# Patient Record
Sex: Male | Born: 1950 | Race: Black or African American | Hispanic: No | Marital: Married | Smoking: Never smoker
Health system: Southern US, Community
[De-identification: ages and names within clinical notes are randomized; demographics above are authoritative.]

## PROBLEM LIST (undated history)

## (undated) DIAGNOSIS — I10 Essential (primary) hypertension: Secondary | ICD-10-CM

## (undated) DIAGNOSIS — E119 Type 2 diabetes mellitus without complications: Secondary | ICD-10-CM

---

## 2005-03-08 ENCOUNTER — Emergency Department (HOSPITAL_COMMUNITY): Admission: EM | Admit: 2005-03-08 | Discharge: 2005-03-09 | Payer: Self-pay | Admitting: Emergency Medicine

## 2006-02-27 ENCOUNTER — Emergency Department: Payer: Self-pay | Admitting: Emergency Medicine

## 2007-07-02 ENCOUNTER — Ambulatory Visit: Payer: Self-pay | Admitting: Internal Medicine

## 2015-11-11 DIAGNOSIS — Z125 Encounter for screening for malignant neoplasm of prostate: Secondary | ICD-10-CM | POA: Diagnosis not present

## 2015-11-11 DIAGNOSIS — Z0000 Encounter for general adult medical examination without abnormal findings: Secondary | ICD-10-CM | POA: Diagnosis not present

## 2015-11-11 DIAGNOSIS — E1165 Type 2 diabetes mellitus with hyperglycemia: Secondary | ICD-10-CM | POA: Diagnosis not present

## 2016-01-01 DIAGNOSIS — E119 Type 2 diabetes mellitus without complications: Secondary | ICD-10-CM | POA: Diagnosis not present

## 2016-06-07 DIAGNOSIS — Z87891 Personal history of nicotine dependence: Secondary | ICD-10-CM | POA: Diagnosis not present

## 2016-06-07 DIAGNOSIS — N401 Enlarged prostate with lower urinary tract symptoms: Secondary | ICD-10-CM | POA: Diagnosis not present

## 2016-06-07 DIAGNOSIS — E119 Type 2 diabetes mellitus without complications: Secondary | ICD-10-CM | POA: Diagnosis not present

## 2016-06-07 DIAGNOSIS — I10 Essential (primary) hypertension: Secondary | ICD-10-CM | POA: Diagnosis not present

## 2016-06-07 DIAGNOSIS — Z136 Encounter for screening for cardiovascular disorders: Secondary | ICD-10-CM | POA: Diagnosis not present

## 2016-06-07 DIAGNOSIS — Z125 Encounter for screening for malignant neoplasm of prostate: Secondary | ICD-10-CM | POA: Diagnosis not present

## 2016-06-07 DIAGNOSIS — Z23 Encounter for immunization: Secondary | ICD-10-CM | POA: Diagnosis not present

## 2016-06-07 DIAGNOSIS — E785 Hyperlipidemia, unspecified: Secondary | ICD-10-CM | POA: Diagnosis not present

## 2016-06-07 DIAGNOSIS — R351 Nocturia: Secondary | ICD-10-CM | POA: Diagnosis not present

## 2016-06-07 DIAGNOSIS — Z6825 Body mass index (BMI) 25.0-25.9, adult: Secondary | ICD-10-CM | POA: Diagnosis not present

## 2016-06-07 DIAGNOSIS — Z1159 Encounter for screening for other viral diseases: Secondary | ICD-10-CM | POA: Diagnosis not present

## 2016-06-07 DIAGNOSIS — D649 Anemia, unspecified: Secondary | ICD-10-CM | POA: Diagnosis not present

## 2016-06-20 DIAGNOSIS — Z87891 Personal history of nicotine dependence: Secondary | ICD-10-CM | POA: Diagnosis not present

## 2016-06-20 DIAGNOSIS — Z136 Encounter for screening for cardiovascular disorders: Secondary | ICD-10-CM | POA: Diagnosis not present

## 2016-07-29 DIAGNOSIS — E119 Type 2 diabetes mellitus without complications: Secondary | ICD-10-CM | POA: Diagnosis not present

## 2016-07-29 DIAGNOSIS — I10 Essential (primary) hypertension: Secondary | ICD-10-CM | POA: Diagnosis not present

## 2016-07-29 DIAGNOSIS — E785 Hyperlipidemia, unspecified: Secondary | ICD-10-CM | POA: Diagnosis not present

## 2016-07-29 DIAGNOSIS — Z6826 Body mass index (BMI) 26.0-26.9, adult: Secondary | ICD-10-CM | POA: Diagnosis not present

## 2016-09-12 DIAGNOSIS — E785 Hyperlipidemia, unspecified: Secondary | ICD-10-CM | POA: Diagnosis not present

## 2016-09-12 DIAGNOSIS — R351 Nocturia: Secondary | ICD-10-CM | POA: Diagnosis not present

## 2016-09-12 DIAGNOSIS — I10 Essential (primary) hypertension: Secondary | ICD-10-CM | POA: Diagnosis not present

## 2016-09-12 DIAGNOSIS — N401 Enlarged prostate with lower urinary tract symptoms: Secondary | ICD-10-CM | POA: Diagnosis not present

## 2016-09-12 DIAGNOSIS — E119 Type 2 diabetes mellitus without complications: Secondary | ICD-10-CM | POA: Diagnosis not present

## 2016-09-28 DIAGNOSIS — E119 Type 2 diabetes mellitus without complications: Secondary | ICD-10-CM | POA: Diagnosis not present

## 2016-09-28 DIAGNOSIS — E875 Hyperkalemia: Secondary | ICD-10-CM | POA: Diagnosis not present

## 2016-09-28 DIAGNOSIS — I10 Essential (primary) hypertension: Secondary | ICD-10-CM | POA: Diagnosis not present

## 2016-10-24 DIAGNOSIS — N401 Enlarged prostate with lower urinary tract symptoms: Secondary | ICD-10-CM | POA: Diagnosis not present

## 2016-10-24 DIAGNOSIS — R351 Nocturia: Secondary | ICD-10-CM | POA: Diagnosis not present

## 2016-12-12 DIAGNOSIS — E119 Type 2 diabetes mellitus without complications: Secondary | ICD-10-CM | POA: Diagnosis not present

## 2016-12-12 DIAGNOSIS — R351 Nocturia: Secondary | ICD-10-CM | POA: Diagnosis not present

## 2016-12-12 DIAGNOSIS — N401 Enlarged prostate with lower urinary tract symptoms: Secondary | ICD-10-CM | POA: Diagnosis not present

## 2016-12-12 DIAGNOSIS — N179 Acute kidney failure, unspecified: Secondary | ICD-10-CM | POA: Diagnosis not present

## 2016-12-12 DIAGNOSIS — M79642 Pain in left hand: Secondary | ICD-10-CM | POA: Diagnosis not present

## 2016-12-12 DIAGNOSIS — E1142 Type 2 diabetes mellitus with diabetic polyneuropathy: Secondary | ICD-10-CM | POA: Diagnosis not present

## 2016-12-12 DIAGNOSIS — M79641 Pain in right hand: Secondary | ICD-10-CM | POA: Diagnosis not present

## 2016-12-12 DIAGNOSIS — Z23 Encounter for immunization: Secondary | ICD-10-CM | POA: Diagnosis not present

## 2016-12-12 DIAGNOSIS — G609 Hereditary and idiopathic neuropathy, unspecified: Secondary | ICD-10-CM | POA: Diagnosis not present

## 2016-12-12 DIAGNOSIS — M79672 Pain in left foot: Secondary | ICD-10-CM | POA: Diagnosis not present

## 2016-12-12 DIAGNOSIS — E1165 Type 2 diabetes mellitus with hyperglycemia: Secondary | ICD-10-CM | POA: Diagnosis not present

## 2016-12-12 DIAGNOSIS — I10 Essential (primary) hypertension: Secondary | ICD-10-CM | POA: Diagnosis not present

## 2016-12-12 DIAGNOSIS — Z6827 Body mass index (BMI) 27.0-27.9, adult: Secondary | ICD-10-CM | POA: Diagnosis not present

## 2016-12-12 DIAGNOSIS — M79671 Pain in right foot: Secondary | ICD-10-CM | POA: Diagnosis not present

## 2016-12-12 DIAGNOSIS — E785 Hyperlipidemia, unspecified: Secondary | ICD-10-CM | POA: Diagnosis not present

## 2016-12-12 DIAGNOSIS — E875 Hyperkalemia: Secondary | ICD-10-CM | POA: Diagnosis not present

## 2016-12-23 DIAGNOSIS — E119 Type 2 diabetes mellitus without complications: Secondary | ICD-10-CM | POA: Diagnosis not present

## 2016-12-23 DIAGNOSIS — H179 Unspecified corneal scar and opacity: Secondary | ICD-10-CM | POA: Diagnosis not present

## 2016-12-23 DIAGNOSIS — N179 Acute kidney failure, unspecified: Secondary | ICD-10-CM | POA: Diagnosis not present

## 2016-12-23 DIAGNOSIS — H2513 Age-related nuclear cataract, bilateral: Secondary | ICD-10-CM | POA: Diagnosis not present

## 2016-12-23 DIAGNOSIS — E11319 Type 2 diabetes mellitus with unspecified diabetic retinopathy without macular edema: Secondary | ICD-10-CM | POA: Diagnosis not present

## 2016-12-23 DIAGNOSIS — H04123 Dry eye syndrome of bilateral lacrimal glands: Secondary | ICD-10-CM | POA: Diagnosis not present

## 2017-01-03 DIAGNOSIS — E119 Type 2 diabetes mellitus without complications: Secondary | ICD-10-CM | POA: Diagnosis not present

## 2017-01-03 DIAGNOSIS — Z6827 Body mass index (BMI) 27.0-27.9, adult: Secondary | ICD-10-CM | POA: Diagnosis not present

## 2017-01-03 DIAGNOSIS — N179 Acute kidney failure, unspecified: Secondary | ICD-10-CM | POA: Diagnosis not present

## 2018-09-06 DIAGNOSIS — Z7984 Long term (current) use of oral hypoglycemic drugs: Secondary | ICD-10-CM | POA: Diagnosis not present

## 2018-09-06 DIAGNOSIS — E1165 Type 2 diabetes mellitus with hyperglycemia: Secondary | ICD-10-CM | POA: Diagnosis not present

## 2018-09-06 DIAGNOSIS — N179 Acute kidney failure, unspecified: Secondary | ICD-10-CM | POA: Diagnosis not present

## 2018-09-06 DIAGNOSIS — R1030 Lower abdominal pain, unspecified: Secondary | ICD-10-CM | POA: Diagnosis not present

## 2018-09-06 DIAGNOSIS — R600 Localized edema: Secondary | ICD-10-CM | POA: Diagnosis not present

## 2018-09-06 DIAGNOSIS — E860 Dehydration: Secondary | ICD-10-CM | POA: Diagnosis not present

## 2018-09-06 DIAGNOSIS — M5137 Other intervertebral disc degeneration, lumbosacral region: Secondary | ICD-10-CM | POA: Diagnosis not present

## 2018-09-06 DIAGNOSIS — I10 Essential (primary) hypertension: Secondary | ICD-10-CM | POA: Diagnosis not present

## 2018-09-06 DIAGNOSIS — M545 Low back pain: Secondary | ICD-10-CM | POA: Diagnosis not present

## 2018-09-06 DIAGNOSIS — H538 Other visual disturbances: Secondary | ICD-10-CM | POA: Diagnosis not present

## 2018-09-06 DIAGNOSIS — Z79899 Other long term (current) drug therapy: Secondary | ICD-10-CM | POA: Diagnosis not present

## 2018-09-06 DIAGNOSIS — E785 Hyperlipidemia, unspecified: Secondary | ICD-10-CM | POA: Diagnosis not present

## 2018-09-06 DIAGNOSIS — G8929 Other chronic pain: Secondary | ICD-10-CM | POA: Diagnosis not present

## 2018-09-06 DIAGNOSIS — Z7982 Long term (current) use of aspirin: Secondary | ICD-10-CM | POA: Diagnosis not present

## 2018-09-06 DIAGNOSIS — M2578 Osteophyte, vertebrae: Secondary | ICD-10-CM | POA: Diagnosis not present

## 2018-09-06 DIAGNOSIS — Z87891 Personal history of nicotine dependence: Secondary | ICD-10-CM | POA: Diagnosis not present

## 2018-09-25 DIAGNOSIS — N179 Acute kidney failure, unspecified: Secondary | ICD-10-CM | POA: Diagnosis not present

## 2018-09-25 DIAGNOSIS — Z6826 Body mass index (BMI) 26.0-26.9, adult: Secondary | ICD-10-CM | POA: Diagnosis not present

## 2018-09-25 DIAGNOSIS — E119 Type 2 diabetes mellitus without complications: Secondary | ICD-10-CM | POA: Diagnosis not present

## 2018-09-25 DIAGNOSIS — G629 Polyneuropathy, unspecified: Secondary | ICD-10-CM | POA: Diagnosis not present

## 2018-09-25 DIAGNOSIS — R799 Abnormal finding of blood chemistry, unspecified: Secondary | ICD-10-CM | POA: Diagnosis not present

## 2018-09-25 DIAGNOSIS — Z23 Encounter for immunization: Secondary | ICD-10-CM | POA: Diagnosis not present

## 2018-12-10 DIAGNOSIS — I10 Essential (primary) hypertension: Secondary | ICD-10-CM | POA: Diagnosis not present

## 2018-12-10 DIAGNOSIS — Z23 Encounter for immunization: Secondary | ICD-10-CM | POA: Diagnosis not present

## 2018-12-10 DIAGNOSIS — R351 Nocturia: Secondary | ICD-10-CM | POA: Diagnosis not present

## 2018-12-10 DIAGNOSIS — R809 Proteinuria, unspecified: Secondary | ICD-10-CM | POA: Diagnosis not present

## 2018-12-10 DIAGNOSIS — E1129 Type 2 diabetes mellitus with other diabetic kidney complication: Secondary | ICD-10-CM | POA: Diagnosis not present

## 2018-12-10 DIAGNOSIS — N401 Enlarged prostate with lower urinary tract symptoms: Secondary | ICD-10-CM | POA: Diagnosis not present

## 2018-12-10 DIAGNOSIS — Z9189 Other specified personal risk factors, not elsewhere classified: Secondary | ICD-10-CM | POA: Diagnosis not present

## 2018-12-10 DIAGNOSIS — Z1159 Encounter for screening for other viral diseases: Secondary | ICD-10-CM | POA: Diagnosis not present

## 2018-12-10 DIAGNOSIS — Z6826 Body mass index (BMI) 26.0-26.9, adult: Secondary | ICD-10-CM | POA: Diagnosis not present

## 2018-12-16 ENCOUNTER — Other Ambulatory Visit: Payer: Self-pay

## 2018-12-16 ENCOUNTER — Emergency Department
Admission: EM | Admit: 2018-12-16 | Discharge: 2018-12-17 | Disposition: A | Discharge status: Home / Self Care | Payer: PPO | Attending: Internal Medicine | Admitting: Internal Medicine

## 2018-12-16 ENCOUNTER — Emergency Department: Payer: PPO

## 2018-12-16 ENCOUNTER — Encounter: Payer: Self-pay | Admitting: Emergency Medicine

## 2018-12-16 DIAGNOSIS — N183 Chronic kidney disease, stage 3 unspecified: Secondary | ICD-10-CM

## 2018-12-16 DIAGNOSIS — E119 Type 2 diabetes mellitus without complications: Secondary | ICD-10-CM | POA: Diagnosis not present

## 2018-12-16 DIAGNOSIS — R509 Fever, unspecified: Secondary | ICD-10-CM | POA: Diagnosis not present

## 2018-12-16 DIAGNOSIS — E871 Hypo-osmolality and hyponatremia: Secondary | ICD-10-CM

## 2018-12-16 DIAGNOSIS — I10 Essential (primary) hypertension: Secondary | ICD-10-CM | POA: Diagnosis not present

## 2018-12-16 DIAGNOSIS — M791 Myalgia, unspecified site: Secondary | ICD-10-CM | POA: Diagnosis present

## 2018-12-16 DIAGNOSIS — N179 Acute kidney failure, unspecified: Secondary | ICD-10-CM | POA: Diagnosis not present

## 2018-12-16 DIAGNOSIS — E1165 Type 2 diabetes mellitus with hyperglycemia: Secondary | ICD-10-CM | POA: Diagnosis not present

## 2018-12-16 DIAGNOSIS — Z79899 Other long term (current) drug therapy: Secondary | ICD-10-CM | POA: Diagnosis not present

## 2018-12-16 DIAGNOSIS — U071 COVID-19: Secondary | ICD-10-CM | POA: Diagnosis not present

## 2018-12-16 DIAGNOSIS — R0689 Other abnormalities of breathing: Secondary | ICD-10-CM | POA: Diagnosis not present

## 2018-12-16 DIAGNOSIS — R0902 Hypoxemia: Secondary | ICD-10-CM | POA: Diagnosis not present

## 2018-12-16 DIAGNOSIS — Z7982 Long term (current) use of aspirin: Secondary | ICD-10-CM | POA: Diagnosis not present

## 2018-12-16 DIAGNOSIS — R000 Tachycardia, unspecified: Secondary | ICD-10-CM | POA: Diagnosis not present

## 2018-12-16 HISTORY — DX: Essential (primary) hypertension: I10

## 2018-12-16 HISTORY — DX: Type 2 diabetes mellitus without complications: E11.9

## 2018-12-16 LAB — COMPREHENSIVE METABOLIC PANEL
ALT: 25 U/L (ref 0–44)
AST: 29 U/L (ref 15–41)
Albumin: 3.5 g/dL (ref 3.5–5.0)
Alkaline Phosphatase: 34 U/L — ABNORMAL LOW (ref 38–126)
Anion gap: 13 (ref 5–15)
BUN: 54 mg/dL — ABNORMAL HIGH (ref 8–23)
CO2: 16 mmol/L — ABNORMAL LOW (ref 22–32)
Calcium: 7.7 mg/dL — ABNORMAL LOW (ref 8.9–10.3)
Chloride: 101 mmol/L (ref 98–111)
Creatinine, Ser: 2.59 mg/dL — ABNORMAL HIGH (ref 0.61–1.24)
GFR calc Af Amer: 28 mL/min — ABNORMAL LOW (ref >60)
GFR calc non Af Amer: 24 mL/min — ABNORMAL LOW (ref >60)
Glucose, Bld: 240 mg/dL — ABNORMAL HIGH (ref 70–99)
Potassium: 4.1 mmol/L (ref 3.5–5.1)
Sodium: 130 mmol/L — ABNORMAL LOW (ref 135–145)
Total Bilirubin: 0.8 mg/dL (ref 0.3–1.2)
Total Protein: 6.9 g/dL (ref 6.5–8.1)

## 2018-12-16 LAB — CBC WITH DIFFERENTIAL/PLATELET
Abs Immature Granulocytes: 0.02 10*3/uL (ref 0.00–0.07)
Basophils Absolute: 0 10*3/uL (ref 0.0–0.1)
Basophils Relative: 0 %
Eosinophils Absolute: 0 10*3/uL (ref 0.0–0.5)
Eosinophils Relative: 0 %
HCT: 33.8 % — ABNORMAL LOW (ref 39.0–52.0)
Hemoglobin: 11.7 g/dL — ABNORMAL LOW (ref 13.0–17.0)
Immature Granulocytes: 0 %
Lymphocytes Relative: 17 %
Lymphs Abs: 0.8 10*3/uL (ref 0.7–4.0)
MCH: 28.3 pg (ref 26.0–34.0)
MCHC: 34.6 g/dL (ref 30.0–36.0)
MCV: 81.8 fL (ref 80.0–100.0)
Monocytes Absolute: 0.3 10*3/uL (ref 0.1–1.0)
Monocytes Relative: 7 %
Neutro Abs: 3.8 10*3/uL (ref 1.7–7.7)
Neutrophils Relative %: 76 %
Platelets: 144 10*3/uL — ABNORMAL LOW (ref 150–400)
RBC: 4.13 MIL/uL — ABNORMAL LOW (ref 4.22–5.81)
RDW: 12.2 % (ref 11.5–15.5)
WBC: 4.9 10*3/uL (ref 4.0–10.5)
nRBC: 0 % (ref 0.0–0.2)

## 2018-12-16 MED ORDER — ACETAMINOPHEN 325 MG PO TABS
650.0000 mg | ORAL_TABLET | Freq: Once | ORAL | Status: AC
  Administered 2018-12-16: 650 mg via ORAL
  Filled 2018-12-16: qty 2

## 2018-12-16 MED ORDER — LIDOCAINE VISCOUS HCL 2 % MT SOLN
15.0000 mL | Freq: Once | OROMUCOSAL | Status: AC
  Administered 2018-12-16: 15 mL via ORAL
  Filled 2018-12-16: qty 15

## 2018-12-16 MED ORDER — ALUM & MAG HYDROXIDE-SIMETH 200-200-20 MG/5ML PO SUSP
30.0000 mL | Freq: Once | ORAL | Status: AC
  Administered 2018-12-16: 23:00:00 30 mL via ORAL
  Filled 2018-12-16: qty 30

## 2018-12-16 MED ORDER — SODIUM CHLORIDE 0.9 % IV BOLUS
1000.0000 mL | Freq: Once | INTRAVENOUS | Status: AC
  Administered 2018-12-16: 17:00:00 1000 mL via INTRAVENOUS

## 2018-12-16 NOTE — ED Triage Notes (Signed)
Patient presents to the ED via EMS from home for body aches and difficulty sleeping.  Patient tested positive for Covid19 on October 6th after being around family members with Covid 39.  Patient denies shortness of breath or chest pain.  Patient is febrile at this time.  Alert and oriented x 4.

## 2018-12-16 NOTE — ED Notes (Addendum)
Pt woke to rounding, c/o indigestion, 1/10 center chest burning, no recent hx of same, no radiation or associated s/sx, maalox offered

## 2018-12-16 NOTE — Progress Notes (Signed)
68 yo male with HTN and T2DM, diagnosed wit COVID 19 on October 6, at home feeling very weak, fever and malaise. On admission stable vital signs, not hypoxic and chest film with no infiltrates. Noted elevated serum cr at 2.49 with K at 4,1.  Admitted to medical ward for AKI.

## 2018-12-16 NOTE — ED Notes (Signed)
Called Phil at [facility] for transfer to [facility]

## 2018-12-16 NOTE — ED Notes (Signed)
ED TO INPATIENT HANDOFF REPORT  ED Nurse Name and Phone #: Danelle Earthly [phone]  S Name/Age/Gender Bryan Oconnor 68 y.o. male Room/Bed: ED16A/ED16A  Code Status   Code Status: Not on file  Home/SNF/Other Home Patient oriented to: self, place, time and situation Is this baseline? Yes   Triage Complete: Triage complete  Chief Complaint body aches  Triage Note Patient presents to the ED via EMS from home for body aches and difficulty sleeping.  Patient tested positive for Covid19 on October 6th after being around family members with Covid 19.  Patient denies shortness of breath or chest pain.  Patient is febrile at this time.  Alert and oriented x 4.     Allergies Allergies  Allergen Reactions  . Pollen Extract Cough    Level of Care/Admitting Diagnosis ED Disposition    ED Disposition Condition Comment   Admit  Hospital Area: [facility] [[id number]]  Level of Care: Med-Surg [16]  Covid Evaluation: Confirmed COVID Positive  Diagnosis: AKI (acute kidney injury) [facility]) [[id number]]  Admitting Physician: Coralie Keens [[id number]]  Attending Physician: Coralie Keens [[id number]]  Estimated length of stay: 3 - 4 days  Certification:: I certify this patient will need inpatient services for at least 2 midnights  PT Class (Do Not Modify): Inpatient [101]  PT Acc Code (Do Not Modify): Private [1]       B Medical/Surgery History Past Medical History:  Diagnosis Date  . Diabetes mellitus without complication (HCC)   . Hypertension    History reviewed. No pertinent surgical history.   A IV Location/Drains/Wounds Patient Lines/Drains/Airways Status   Active Line/Drains/Airways    Name:   Placement date:   Placement time:   Site:   Days:   Peripheral IV 12/16/18 Anterior;Distal;Left;Upper Arm   12/16/18    -    Arm   less than 1          Intake/Output Last 24 hours No intake or output data in the 24 hours ending 12/16/18  2023  Labs/Imaging Results for orders placed or performed during the hospital encounter of 12/16/18 (from the past 48 hour(s))  CBC with Differential     Status: Abnormal   Collection Time: 12/16/18  4:02 PM  Result Value Ref Range   WBC 4.9 4.0 - 10.5 K/uL   RBC 4.13 (L) 4.22 - 5.81 MIL/uL   Hemoglobin 11.7 (L) 13.0 - 17.0 g/dL   HCT 93.2 (L) 67.1 - 24.5 %   MCV 81.8 80.0 - 100.0 fL   MCH 28.3 26.0 - 34.0 pg   MCHC 34.6 30.0 - 36.0 g/dL   RDW 80.9 98.3 - 38.2 %   Platelets 144 (L) 150 - 400 K/uL   nRBC 0.0 0.0 - 0.2 %   Neutrophils Relative % 76 %   Neutro Abs 3.8 1.7 - 7.7 K/uL   Lymphocytes Relative 17 %   Lymphs Abs 0.8 0.7 - 4.0 K/uL   Monocytes Relative 7 %   Monocytes Absolute 0.3 0.1 - 1.0 K/uL   Eosinophils Relative 0 %   Eosinophils Absolute 0.0 0.0 - 0.5 K/uL   Basophils Relative 0 %   Basophils Absolute 0.0 0.0 - 0.1 K/uL   Immature Granulocytes 0 %   Abs Immature Granulocytes 0.02 0.00 - 0.07 K/uL    Comment: Performed at [facility], [address] [place], [place] [address]  Comprehensive metabolic panel     Status: Abnormal   Collection Time: 12/16/18  4:02  PM  Result Value Ref Range   Sodium 130 (L) 135 - 145 mmol/L   Potassium 4.1 3.5 - 5.1 mmol/L   Chloride 101 98 - 111 mmol/L   CO2 16 (L) 22 - 32 mmol/L   Glucose, Bld 240 (H) 70 - 99 mg/dL   BUN 54 (H) 8 - 23 mg/dL   Creatinine, Ser 2.59 (H) 0.61 - 1.24 mg/dL   Calcium 7.7 (L) 8.9 - 10.3 mg/dL   Total Protein 6.9 6.5 - 8.1 g/dL   Albumin 3.5 3.5 - 5.0 g/dL   AST 29 15 - 41 U/L   ALT 25 0 - 44 U/L   Alkaline Phosphatase 34 (L) 38 - 126 U/L   Total Bilirubin 0.8 0.3 - 1.2 mg/dL   GFR calc non Af Amer 24 (L) >60 mL/min   GFR calc Af Amer 28 (L) >60 mL/min   Anion gap 13 5 - 15    Comment: Performed at [facility], [address] [place], [place] [address]   Dg Chest Portable 1 View  Result Date: 12/16/2018 CLINICAL DATA:  Fever.  COVID-19 positive. EXAM: PORTABLE CHEST 1  VIEW COMPARISON:  None. FINDINGS: Cardiomediastinal silhouette is normal. Mediastinal contours appear intact. There is no evidence of focal airspace consolidation, pleural effusion or pneumothorax. Osseous structures are without acute abnormality. Soft tissues are grossly normal. IMPRESSION: No active disease. Electronically Signed   By: Fidela Salisbury M.D.   On: 12/16/2018 16:01    Pending Labs Unresulted Labs (From admission, onward)   None      Vitals/Pain Today's Vitals   12/16/18 1800 12/16/18 1825 12/16/18 1830 12/16/18 1900  BP: 115/77  120/81 133/74  Pulse: 88  73 77  Resp:   17 19  Temp:      TempSrc:      SpO2: 100%  98% 97%  Weight:      Height:      PainSc:  0-No pain      Isolation Precautions No active isolations  Medications Medications  acetaminophen (TYLENOL) tablet 650 mg (650 mg Oral Given 12/16/18 1605)  sodium chloride 0.9 % bolus 1,000 mL (1,000 mLs Intravenous New Bag/Given 12/16/18 1656)    Mobility walks Low fall risk   Focused Assessments Pulmonary Assessment Handoff:  Lung sounds:   O2 Device: Room Air        R Recommendations: See Admitting Provider Note  Report given to:   Additional Notes:

## 2018-12-16 NOTE — ED Provider Notes (Signed)
ED ECG REPORT I, SUNG,JADE J, the attending physician, personally viewed and interpreted this ECG.   Date: 12/16/2018  EKG Time: 2313  Rate: 74  Rhythm: normal EKG, normal sinus rhythm  Axis: Normal  Intervals:none  ST&T Change: Nonspecific  Patient complaining of indigestion.  EKG unremarkable.  Will administer GI cocktail.    Paulette Blanch, MD 12/17/18 [phone]

## 2018-12-16 NOTE — ED Provider Notes (Signed)
[facility] Emergency Department Provider Note  ____________________________________________  Time seen: Approximately 3:42 PM  I have reviewed the triage vital signs and the nursing notes.   HISTORY  Chief Complaint Generalized Body Aches   HPI Bryan Oconnor is a 68 y.o. male who tested positive for COVID-19 from 12/11/2018 and presents to the emergency department today for difficulty sleeping, body aches and fever.  Patient denies chest pain or shortness of breath.  He states that he has an occasional cough but seems like it has improved over the past few days.  He denies any vomiting or diarrhea.   Patient states that he was not given any medications when he was diagnosed.  He states that he took Tylenol last night but otherwise has not taken any medications other than his home medications.   Past Medical History:  Diagnosis Date  . Diabetes mellitus without complication ([place])   . Hypertension     Patient Active Problem List   Diagnosis Date Noted  . AKI (acute kidney injury) ([place]) 12/16/2018    History reviewed. No pertinent surgical history.  Prior to Admission medications   Medication Sig Start Date End Date Taking? Authorizing Provider  aspirin EC 81 MG tablet Take 81 mg by mouth daily.   Yes [provider]  atorvastatin (LIPITOR) 40 MG tablet Take 40 mg by mouth daily. 11/26/18  Yes [provider]  finasteride (PROSCAR) 5 MG tablet Take 5 mg by mouth daily. 11/26/18  Yes [provider]  gabapentin (NEURONTIN) 300 MG capsule Take 600 mg by mouth at bedtime. 12/10/18  Yes [provider]  glipiZIDE (GLUCOTROL XL) 10 MG 24 hr tablet Take 10 mg by mouth daily. 11/26/18  Yes [provider]  lisinopril (ZESTRIL) 10 MG tablet Take 10 mg by mouth daily. 12/10/18  Yes [provider]  metFORMIN (GLUCOPHAGE) 1000 MG tablet Take 1,000 mg by mouth 2 (two) times daily with a meal. 11/26/18  Yes [provider]    Allergies Pollen extract  No family history on file.  Social History Social History   Tobacco Use  . Smoking status: Never Smoker  . Smokeless tobacco: Never Used  Substance Use Topics  . Alcohol use: Never    Frequency: Never  . Drug use: Not on file    Review of Systems Constitutional: Positive for fever/chills.  Decreased appetite. ENT: Negative for sore throat. Cardiovascular: Denies chest pain. Respiratory: No shortness of breath.  Positive for cough.  Negative wheezing.  Gastrointestinal: Negative for nausea, no vomiting.  No diarrhea.  Musculoskeletal: Positive for body aches Skin: Negative for rash. Neurological: Positive for headaches ____________________________________________   PHYSICAL EXAM:  VITAL SIGNS: ED Triage Vitals  Enc Vitals Group     BP 12/16/18 1432 123/71     Pulse Rate 12/16/18 1432 (!) 106     Resp 12/16/18 1432 (!) 22     Temp 12/16/18 1432 (!) 101.8 F (38.8 C)     Temp Source 12/16/18 1432 Oral     SpO2 12/16/18 1432 99 %     Weight 12/16/18 1433 160 lb (72.6 kg)     Height 12/16/18 1433 5\' 6"  (1.676 m)     Head Circumference --      Peak Flow --      Pain Score 12/16/18 1433 4     Pain Loc --      Pain Edu? --      Excl. in [place]? --  Constitutional: Alert and oriented.  Overall well appearing and in no acute distress. Eyes: Conjunctivae are normal. Ears: Bilateral TMs are normal Nose: No sinus congestion noted; no rhinnorhea. Mouth/Throat: Mucous membranes are moist.  Oropharynx clear. Tonsils not visualized. Uvula midline. Neck: No stridor.  Lymphatic: No cervical lymphadenopathy. Cardiovascular: Normal rate, regular rhythm. Good peripheral circulation. Respiratory: Respirations are even and unlabored.  No retractions.  Breath sounds clear to auscultation throughout. Gastrointestinal: Soft and nontender.  Musculoskeletal: FROM x 4 extremities.  Neurologic:  Normal speech and language. Skin:  Skin is  warm, dry and intact. No rash noted. Psychiatric: Mood and affect are normal. Speech and behavior are normal.  ____________________________________________   LABS (all labs ordered are listed, but only abnormal results are displayed)  Labs Reviewed  CBC WITH DIFFERENTIAL/PLATELET - Abnormal; Notable for the following components:      Result Value   RBC 4.13 (*)    Hemoglobin 11.7 (*)    HCT 33.8 (*)    Platelets 144 (*)    All other components within normal limits  COMPREHENSIVE METABOLIC PANEL - Abnormal; Notable for the following components:   Sodium 130 (*)    CO2 16 (*)    Glucose, Bld 240 (*)    BUN 54 (*)    Creatinine, Ser 2.59 (*)    Calcium 7.7 (*)    Alkaline Phosphatase 34 (*)    GFR calc non Af Amer 24 (*)    GFR calc Af Amer 28 (*)    All other components within normal limits   ____________________________________________  EKG  Not indicated. ____________________________________________  RADIOLOGY  Chest x-ray is negative for acute cardiopulmonary abnormality per radiology. ____________________________________________   PROCEDURES  Procedure(s) performed: None  Critical Care performed: No ____________________________________________   INITIAL IMPRESSION / ASSESSMENT AND PLAN / ED COURSE  68 y.o. male presenting to the emergency department for treatment and evaluation of body aches, fever, and difficulty sleeping.  See HPI for further details.  Plan will be to obtain a chest x-ray and basic labs.  He is not hypoxic but he is slightly tachycardic with a respiratory rate of 22 and a temperature 101.8.  He will be given Tylenol.  His tachycardia and tachypnea are most likely related to fever. Will reevaluate after fever is reduced and some studies have resulted.  Differential diagnosis includes, but is not limited to: COVID-19, SIRS, Pneumonia.  CBC is overall unremarkable.  CM P does show hyponatremia at 130 as well as BUN of 54 and a creatinine of 2.5  with an overall GFR 28 and glucose is 240.  No labs for comparison are available.  The patient will require admission for acute kidney injury, however because he is COVID positive and there are no available COVID beds in [facility] at this time he will require transfer to [facility].  Plan was discussed with the patient and he agrees to the transfer.  Arrangements will be made by Dr. Cyril Loosen who will also complete the EMTALA form.  Medications  acetaminophen (TYLENOL) tablet 650 mg (650 mg Oral Given 12/16/18 1605)  sodium chloride 0.9 % bolus 1,000 mL (1,000 mLs Intravenous New Bag/Given 12/16/18 1656)    ED Discharge Orders    None       Pertinent labs & imaging results that were available during my care of the patient were reviewed by me and considered in my medical decision making (see chart for details).    If controlled substance prescribed during this visit, 12  month history viewed on the [facility] prior to issuing an initial prescription for Schedule II or III opiod. ____________________________________________   FINAL CLINICAL IMPRESSION(S) / ED DIAGNOSES  Final diagnoses:  COVID-19  Acute kidney injury (HCC)    Note:  This document was prepared using Dragon voice recognition software and may include unintentional dictation errors.    Chinita Pesterriplett, Romonia Yanik B, FNP 12/16/18 1953    Jene EveryKinner, Robert, MD 12/16/18 2015

## 2018-12-17 ENCOUNTER — Observation Stay (HOSPITAL_COMMUNITY): Payer: PPO

## 2018-12-17 ENCOUNTER — Inpatient Hospital Stay (HOSPITAL_COMMUNITY)
Admission: AD | Admit: 2018-12-17 | Discharge: 2018-12-22 | DRG: 177 | Disposition: A | Discharge status: Home / Self Care | Payer: PPO | Source: Other Acute Inpatient Hospital | Attending: Internal Medicine | Admitting: Internal Medicine

## 2018-12-17 ENCOUNTER — Encounter (HOSPITAL_COMMUNITY): Payer: Self-pay | Admitting: Family Medicine

## 2018-12-17 DIAGNOSIS — E1165 Type 2 diabetes mellitus with hyperglycemia: Secondary | ICD-10-CM | POA: Diagnosis not present

## 2018-12-17 DIAGNOSIS — R7401 Elevation of levels of liver transaminase levels: Secondary | ICD-10-CM | POA: Diagnosis not present

## 2018-12-17 DIAGNOSIS — R509 Fever, unspecified: Secondary | ICD-10-CM

## 2018-12-17 DIAGNOSIS — R05 Cough: Secondary | ICD-10-CM | POA: Diagnosis present

## 2018-12-17 DIAGNOSIS — J1282 Pneumonia due to coronavirus disease 2019: Secondary | ICD-10-CM | POA: Diagnosis present

## 2018-12-17 DIAGNOSIS — J129 Viral pneumonia, unspecified: Secondary | ICD-10-CM

## 2018-12-17 DIAGNOSIS — J1289 Other viral pneumonia: Secondary | ICD-10-CM | POA: Diagnosis not present

## 2018-12-17 DIAGNOSIS — J9601 Acute respiratory failure with hypoxia: Secondary | ICD-10-CM | POA: Diagnosis present

## 2018-12-17 DIAGNOSIS — T380X5A Adverse effect of glucocorticoids and synthetic analogues, initial encounter: Secondary | ICD-10-CM | POA: Diagnosis not present

## 2018-12-17 DIAGNOSIS — N1831 Chronic kidney disease, stage 3a: Secondary | ICD-10-CM | POA: Diagnosis not present

## 2018-12-17 DIAGNOSIS — E861 Hypovolemia: Secondary | ICD-10-CM | POA: Diagnosis present

## 2018-12-17 DIAGNOSIS — Z7984 Long term (current) use of oral hypoglycemic drugs: Secondary | ICD-10-CM | POA: Diagnosis not present

## 2018-12-17 DIAGNOSIS — I10 Essential (primary) hypertension: Secondary | ICD-10-CM | POA: Diagnosis not present

## 2018-12-17 DIAGNOSIS — N179 Acute kidney failure, unspecified: Secondary | ICD-10-CM | POA: Diagnosis not present

## 2018-12-17 DIAGNOSIS — E1122 Type 2 diabetes mellitus with diabetic chronic kidney disease: Secondary | ICD-10-CM | POA: Diagnosis present

## 2018-12-17 DIAGNOSIS — Z79899 Other long term (current) drug therapy: Secondary | ICD-10-CM | POA: Diagnosis not present

## 2018-12-17 DIAGNOSIS — N183 Chronic kidney disease, stage 3 unspecified: Secondary | ICD-10-CM | POA: Diagnosis not present

## 2018-12-17 DIAGNOSIS — U071 COVID-19: Secondary | ICD-10-CM | POA: Diagnosis not present

## 2018-12-17 DIAGNOSIS — E785 Hyperlipidemia, unspecified: Secondary | ICD-10-CM | POA: Diagnosis present

## 2018-12-17 DIAGNOSIS — Z7982 Long term (current) use of aspirin: Secondary | ICD-10-CM

## 2018-12-17 DIAGNOSIS — D631 Anemia in chronic kidney disease: Secondary | ICD-10-CM | POA: Diagnosis not present

## 2018-12-17 DIAGNOSIS — I129 Hypertensive chronic kidney disease with stage 1 through stage 4 chronic kidney disease, or unspecified chronic kidney disease: Secondary | ICD-10-CM | POA: Diagnosis present

## 2018-12-17 DIAGNOSIS — E871 Hypo-osmolality and hyponatremia: Secondary | ICD-10-CM | POA: Diagnosis present

## 2018-12-17 DIAGNOSIS — E872 Acidosis: Secondary | ICD-10-CM | POA: Diagnosis not present

## 2018-12-17 LAB — CBC WITH DIFFERENTIAL/PLATELET
Abs Immature Granulocytes: 0.01 10*3/uL (ref 0.00–0.07)
Basophils Absolute: 0 10*3/uL (ref 0.0–0.1)
Basophils Relative: 0 %
Eosinophils Absolute: 0 10*3/uL (ref 0.0–0.5)
Eosinophils Relative: 0 %
HCT: 35.2 % — ABNORMAL LOW (ref 39.0–52.0)
Hemoglobin: 11.8 g/dL — ABNORMAL LOW (ref 13.0–17.0)
Immature Granulocytes: 0 %
Lymphocytes Relative: 14 %
Lymphs Abs: 0.6 10*3/uL — ABNORMAL LOW (ref 0.7–4.0)
MCH: 28.2 pg (ref 26.0–34.0)
MCHC: 33.5 g/dL (ref 30.0–36.0)
MCV: 84.2 fL (ref 80.0–100.0)
Monocytes Absolute: 0.2 10*3/uL (ref 0.1–1.0)
Monocytes Relative: 5 %
Neutro Abs: 3.3 10*3/uL (ref 1.7–7.7)
Neutrophils Relative %: 81 %
Platelets: 167 10*3/uL (ref 150–400)
RBC: 4.18 MIL/uL — ABNORMAL LOW (ref 4.22–5.81)
RDW: 12.4 % (ref 11.5–15.5)
WBC: 4.1 10*3/uL (ref 4.0–10.5)
nRBC: 0 % (ref 0.0–0.2)

## 2018-12-17 LAB — GLUCOSE, CAPILLARY
Glucose-Capillary: 203 mg/dL — ABNORMAL HIGH (ref 70–99)
Glucose-Capillary: 169 mg/dL — ABNORMAL HIGH (ref 70–99)
Glucose-Capillary: 161 mg/dL — ABNORMAL HIGH (ref 70–99)
Glucose-Capillary: 279 mg/dL — ABNORMAL HIGH (ref 70–99)
Glucose-Capillary: 414 mg/dL — ABNORMAL HIGH (ref 70–99)

## 2018-12-17 LAB — URINALYSIS, COMPLETE (UACMP) WITH MICROSCOPIC
Bilirubin Urine: NEGATIVE
Glucose, UA: NEGATIVE mg/dL
Ketones, ur: NEGATIVE mg/dL
Leukocytes,Ua: NEGATIVE
Nitrite: NEGATIVE
Protein, ur: 30 mg/dL — AB
Specific Gravity, Urine: 1.013 (ref 1.005–1.030)
pH: 5 (ref 5.0–8.0)

## 2018-12-17 LAB — D-DIMER, QUANTITATIVE: D-Dimer, Quant: 1.48 ug/mL-FEU — ABNORMAL HIGH (ref 0.00–0.50)

## 2018-12-17 LAB — COMPREHENSIVE METABOLIC PANEL
ALT: 36 U/L (ref 0–44)
AST: 39 U/L (ref 15–41)
Albumin: 3.2 g/dL — ABNORMAL LOW (ref 3.5–5.0)
Alkaline Phosphatase: 35 U/L — ABNORMAL LOW (ref 38–126)
Anion gap: 12 (ref 5–15)
BUN: 51 mg/dL — ABNORMAL HIGH (ref 8–23)
CO2: 18 mmol/L — ABNORMAL LOW (ref 22–32)
Calcium: 7.8 mg/dL — ABNORMAL LOW (ref 8.9–10.3)
Chloride: 103 mmol/L (ref 98–111)
Creatinine, Ser: 2.29 mg/dL — ABNORMAL HIGH (ref 0.61–1.24)
GFR calc Af Amer: 33 mL/min — ABNORMAL LOW (ref >60)
GFR calc non Af Amer: 28 mL/min — ABNORMAL LOW (ref >60)
Glucose, Bld: 183 mg/dL — ABNORMAL HIGH (ref 70–99)
Potassium: 4 mmol/L (ref 3.5–5.1)
Sodium: 133 mmol/L — ABNORMAL LOW (ref 135–145)
Total Bilirubin: 0.4 mg/dL (ref 0.3–1.2)
Total Protein: 7 g/dL (ref 6.5–8.1)

## 2018-12-17 LAB — MAGNESIUM: Magnesium: 1.7 mg/dL (ref 1.7–2.4)

## 2018-12-17 LAB — PROCALCITONIN: Procalcitonin: 0.67 ng/mL

## 2018-12-17 LAB — FERRITIN: Ferritin: 1199 ng/mL — ABNORMAL HIGH (ref 24–336)

## 2018-12-17 LAB — SODIUM, URINE, RANDOM: Sodium, Ur: 51 mmol/L

## 2018-12-17 LAB — C-REACTIVE PROTEIN: CRP: 23.5 mg/dL — ABNORMAL HIGH (ref <1.0)

## 2018-12-17 LAB — ABO/RH: ABO/RH(D): B POS

## 2018-12-17 LAB — PHOSPHORUS: Phosphorus: 2.6 mg/dL (ref 2.5–4.6)

## 2018-12-17 LAB — HIV ANTIBODY (ROUTINE TESTING W REFLEX): HIV Screen 4th Generation wRfx: NONREACTIVE

## 2018-12-17 LAB — MRSA PCR SCREENING: MRSA by PCR: NEGATIVE

## 2018-12-17 LAB — CREATININE, URINE, RANDOM: Creatinine, Urine: 108.74 mg/dL

## 2018-12-17 MED ORDER — HYDROCODONE-ACETAMINOPHEN 5-325 MG PO TABS: 1.0000 | ORAL_TABLET | ORAL | Status: DC | PRN

## 2018-12-17 MED ORDER — ATORVASTATIN CALCIUM 40 MG PO TABS
40.0000 mg | ORAL_TABLET | Freq: Every day | ORAL | Status: DC
  Administered 2018-12-17 – 2018-12-18 (×2): 40 mg via ORAL
  Filled 2018-12-17 (×2): qty 1

## 2018-12-17 MED ORDER — ZINC SULFATE 220 (50 ZN) MG PO CAPS
220.0000 mg | ORAL_CAPSULE | Freq: Every day | ORAL | Status: DC
  Administered 2018-12-17 – 2018-12-22 (×6): 220 mg via ORAL
  Filled 2018-12-17 (×5): qty 1

## 2018-12-17 MED ORDER — SODIUM CHLORIDE 0.9 % IV SOLN
INTRAVENOUS | Status: DC
  Administered 2018-12-17: 05:00:00 via INTRAVENOUS

## 2018-12-17 MED ORDER — SODIUM CHLORIDE 0.9 % IV SOLN
100.0000 mg | INTRAVENOUS | Status: AC
  Administered 2018-12-18 – 2018-12-21 (×4): 100 mg via INTRAVENOUS
  Filled 2018-12-17 (×4): qty 20

## 2018-12-17 MED ORDER — POLYETHYLENE GLYCOL 3350 17 G PO PACK: 17.0000 g | PACK | Freq: Every day | ORAL | Status: DC | PRN

## 2018-12-17 MED ORDER — ONDANSETRON HCL 4 MG/2ML IJ SOLN
4.0000 mg | Freq: Four times a day (QID) | INTRAMUSCULAR | Status: DC | PRN
  Administered 2018-12-22: 4 mg via INTRAVENOUS
  Filled 2018-12-17: qty 2

## 2018-12-17 MED ORDER — GABAPENTIN 300 MG PO CAPS
600.0000 mg | ORAL_CAPSULE | Freq: Every day | ORAL | Status: DC
  Administered 2018-12-17 – 2018-12-21 (×5): 600 mg via ORAL
  Filled 2018-12-17 (×5): qty 2

## 2018-12-17 MED ORDER — STERILE WATER FOR INJECTION IV SOLN
INTRAVENOUS | Status: DC
  Administered 2018-12-17: 11:00:00 via INTRAVENOUS
  Filled 2018-12-17 (×2): qty 850

## 2018-12-17 MED ORDER — DEXAMETHASONE SODIUM PHOSPHATE 10 MG/ML IJ SOLN
6.0000 mg | INTRAMUSCULAR | Status: DC
  Administered 2018-12-17 – 2018-12-22 (×6): 6 mg via INTRAVENOUS
  Filled 2018-12-17 (×6): qty 1

## 2018-12-17 MED ORDER — INSULIN ASPART 100 UNIT/ML ~~LOC~~ SOLN
10.0000 [IU] | Freq: Once | SUBCUTANEOUS | Status: AC
  Administered 2018-12-17: 10 [IU] via SUBCUTANEOUS

## 2018-12-17 MED ORDER — SODIUM BICARBONATE 650 MG PO TABS
650.0000 mg | ORAL_TABLET | Freq: Two times a day (BID) | ORAL | Status: DC
  Administered 2018-12-17: 650 mg via ORAL
  Filled 2018-12-17: qty 1

## 2018-12-17 MED ORDER — ONDANSETRON HCL 4 MG PO TABS: 4.0000 mg | ORAL_TABLET | Freq: Four times a day (QID) | ORAL | Status: DC | PRN

## 2018-12-17 MED ORDER — VITAMIN C 500 MG PO TABS
500.0000 mg | ORAL_TABLET | Freq: Every day | ORAL | Status: DC
  Administered 2018-12-17 – 2018-12-22 (×6): 500 mg via ORAL
  Filled 2018-12-17 (×6): qty 1

## 2018-12-17 MED ORDER — INSULIN ASPART 100 UNIT/ML ~~LOC~~ SOLN
0.0000 [IU] | Freq: Three times a day (TID) | SUBCUTANEOUS | Status: DC
  Administered 2018-12-17 (×2): 2 [IU] via SUBCUTANEOUS
  Administered 2018-12-17 – 2018-12-18 (×3): 5 [IU] via SUBCUTANEOUS
  Administered 2018-12-18: 9 [IU] via SUBCUTANEOUS
  Administered 2018-12-19: 7 [IU] via SUBCUTANEOUS
  Administered 2018-12-19: 5 [IU] via SUBCUTANEOUS

## 2018-12-17 MED ORDER — ASPIRIN EC 81 MG PO TBEC
81.0000 mg | DELAYED_RELEASE_TABLET | Freq: Every day | ORAL | Status: DC
  Administered 2018-12-17 – 2018-12-22 (×6): 81 mg via ORAL
  Filled 2018-12-17 (×6): qty 1

## 2018-12-17 MED ORDER — FINASTERIDE 5 MG PO TABS
5.0000 mg | ORAL_TABLET | Freq: Every day | ORAL | Status: DC
  Administered 2018-12-17 – 2018-12-22 (×6): 5 mg via ORAL
  Filled 2018-12-17 (×7): qty 1

## 2018-12-17 MED ORDER — INSULIN ASPART 100 UNIT/ML ~~LOC~~ SOLN
0.0000 [IU] | Freq: Every day | SUBCUTANEOUS | Status: DC
  Administered 2018-12-17: 5 [IU] via SUBCUTANEOUS

## 2018-12-17 MED ORDER — ACETAMINOPHEN 325 MG PO TABS
650.0000 mg | ORAL_TABLET | Freq: Four times a day (QID) | ORAL | Status: DC | PRN
  Administered 2018-12-17: 650 mg via ORAL
  Filled 2018-12-17 (×2): qty 2

## 2018-12-17 MED ORDER — GUAIFENESIN 100 MG/5ML PO SOLN
5.0000 mL | ORAL | Status: DC | PRN
  Administered 2018-12-18: 5 mL via ORAL
  Administered 2018-12-18 – 2018-12-19 (×2): 100 mg via ORAL
  Filled 2018-12-17 (×3): qty 15

## 2018-12-17 MED ORDER — MAGNESIUM SULFATE 2 GM/50ML IV SOLN
2.0000 g | Freq: Once | INTRAVENOUS | Status: AC
  Administered 2018-12-17: 2 g via INTRAVENOUS
  Filled 2018-12-17: qty 50

## 2018-12-17 MED ORDER — SODIUM CHLORIDE 0.9 % IV SOLN
200.0000 mg | Freq: Once | INTRAVENOUS | Status: AC
  Administered 2018-12-17: 200 mg via INTRAVENOUS
  Filled 2018-12-17: qty 40

## 2018-12-17 MED ORDER — HEPARIN SODIUM (PORCINE) 5000 UNIT/ML IJ SOLN
5000.0000 [IU] | Freq: Three times a day (TID) | INTRAMUSCULAR | Status: DC
  Administered 2018-12-17 – 2018-12-22 (×16): 5000 [IU] via SUBCUTANEOUS
  Filled 2018-12-17 (×12): qty 1

## 2018-12-17 NOTE — ED Notes (Signed)
Report given to Wilton RN

## 2018-12-17 NOTE — Plan of Care (Signed)
  Problem: Education: Goal: Knowledge of General Education information will improve Description: Including pain rating scale, medication(s)/side effects and non-pharmacologic comfort measures 12/17/2018 1228 by Orvan Falconer, RN Outcome: Progressing 12/17/2018 1227 by Orvan Falconer, RN Outcome: Progressing   Problem: Health Behavior/Discharge Planning: Goal: Ability to manage health-related needs will improve 12/17/2018 1228 by Orvan Falconer, RN Outcome: Progressing 12/17/2018 1227 by Orvan Falconer, RN Outcome: Progressing   Problem: Clinical Measurements: Goal: Ability to maintain clinical measurements within normal limits will improve 12/17/2018 1228 by Orvan Falconer, RN Outcome: Progressing 12/17/2018 1227 by Orvan Falconer, RN Outcome: Progressing Goal: Will remain free from infection 12/17/2018 1228 by Orvan Falconer, RN Outcome: Progressing 12/17/2018 1227 by Orvan Falconer, RN Outcome: Progressing Goal: Diagnostic test results will improve 12/17/2018 1228 by Orvan Falconer, RN Outcome: Progressing 12/17/2018 1227 by Orvan Falconer, RN Outcome: Progressing Goal: Respiratory complications will improve 12/17/2018 1228 by Orvan Falconer, RN Outcome: Progressing 12/17/2018 1227 by Orvan Falconer, RN Outcome: Progressing Goal: Cardiovascular complication will be avoided 12/17/2018 1228 by Orvan Falconer, RN Outcome: Progressing 12/17/2018 1227 by Orvan Falconer, RN Outcome: Progressing   Problem: Activity: Goal: Risk for activity intolerance will decrease 12/17/2018 1228 by Orvan Falconer, RN Outcome: Progressing 12/17/2018 1227 by Orvan Falconer, RN Outcome: Progressing   Problem: Nutrition: Goal: Adequate nutrition will be maintained 12/17/2018 1228 by Orvan Falconer, RN Outcome: Progressing 12/17/2018 1227 by Orvan Falconer, RN Outcome: Progressing   Problem: Coping: Goal: Level  of anxiety will decrease 12/17/2018 1228 by Orvan Falconer, RN Outcome: Progressing 12/17/2018 1227 by Orvan Falconer, RN Outcome: Progressing   Problem: Elimination: Goal: Will not experience complications related to bowel motility 12/17/2018 1228 by Orvan Falconer, RN Outcome: Progressing 12/17/2018 1227 by Orvan Falconer, RN Outcome: Progressing Goal: Will not experience complications related to urinary retention 12/17/2018 1228 by Orvan Falconer, RN Outcome: Progressing 12/17/2018 1227 by Orvan Falconer, RN Outcome: Progressing   Problem: Pain Managment: Goal: General experience of comfort will improve 12/17/2018 1228 by Orvan Falconer, RN Outcome: Progressing 12/17/2018 1227 by Orvan Falconer, RN Outcome: Progressing   Problem: Safety: Goal: Ability to remain free from injury will improve 12/17/2018 1228 by Orvan Falconer, RN Outcome: Progressing 12/17/2018 1227 by Orvan Falconer, RN Outcome: Progressing   Problem: Skin Integrity: Goal: Risk for impaired skin integrity will decrease 12/17/2018 1228 by Orvan Falconer, RN Outcome: Progressing 12/17/2018 1227 by Orvan Falconer, RN Outcome: Progressing   Problem: Education: Goal: Knowledge of risk factors and measures for prevention of condition will improve 12/17/2018 1228 by Orvan Falconer, RN Outcome: Progressing 12/17/2018 1227 by Orvan Falconer, RN Outcome: Progressing   Problem: Coping: Goal: Psychosocial and spiritual needs will be supported 12/17/2018 1228 by Orvan Falconer, RN Outcome: Progressing 12/17/2018 1227 by Orvan Falconer, RN Outcome: Progressing   Problem: Respiratory: Goal: Will maintain a patent airway 12/17/2018 1228 by Orvan Falconer, RN Outcome: Progressing 12/17/2018 1227 by Orvan Falconer, RN Outcome: Progressing Goal: Complications related to the disease process, condition or treatment will be avoided or  minimized 12/17/2018 1228 by Orvan Falconer, RN Outcome: Progressing 12/17/2018 1227 by Orvan Falconer, RN Outcome: Progressing

## 2018-12-17 NOTE — ED Notes (Signed)
Call bell answered, Pt disconnect to toilet - reporting urge to BM, pt updated on wait time of approx 45 mins, pt ambulatory to toilet

## 2018-12-17 NOTE — H&P (Addendum)
History and Physical    Bryan Oconnor UMP:[id number] DOB: 12-04-1950 DOA: 12/17/2018  PCP: Patient, No Pcp Per   Patient coming from: Home   Chief Complaint: Aches, malaise, cough   HPI: Bryan Oconnor is a 68 y.o. male with medical history significant for hypertension, type 2 diabetes mellitus, and chronic kidney disease stage III, now presenting to the emergency department with aches, malaise, and cough after being diagnosed with COVID-19 on 12/10/2018.  Patient reports that he was experiencing generalized aches, fevers, and felt as though he he had influenza approximately 1 week ago.  Was found to be positive for COVID-19 at that time.  He initially felt better with acetaminophen at home, but symptoms began to worsen again 3 days ago with loose stools, fevers, generalized aches, and cough.  He also has had a loss of appetite and reports not eating or drinking much of anything in the past 3 days.  He has not noted any shortness of breath.  Denies chest pain.  Bryan Oconnor ED Course: Upon arrival to the ED, patient is found to be febrile to 38.8 C, saturating 99%, slightly tachypneic, slightly tachycardic, and with stable blood pressure.  EKG features a sinus rhythm and chest x-ray is negative for acute cardiopulmonary disease.  Chemistry panel is notable for a sodium of 130, bicarbonate 16, BUN 54, and creatinine 2.59, up from 1.76 in July.  CBC features a mild normocytic anemia and slight thrombocytopenia.  Patient was treated with Tylenol and a liter of normal saline in the ED.  Review of Systems:  All other systems reviewed and apart from HPI, are negative.  Past Medical History:  Diagnosis Date  . Diabetes mellitus without complication ([place])   . Hypertension     History reviewed. No pertinent surgical history.   reports that he has never smoked. He has never used smokeless tobacco. He reports that he does not drink alcohol. No history on file for drug.   Allergies  Allergen Reactions  . Pollen Extract Cough    History reviewed. No pertinent family history.   Prior to Admission medications   Medication Sig Start Date End Date Taking? Authorizing Provider  aspirin EC 81 MG tablet Take 81 mg by mouth daily.    [provider]  atorvastatin (LIPITOR) 40 MG tablet Take 40 mg by mouth daily. 11/26/18   [provider]  finasteride (PROSCAR) 5 MG tablet Take 5 mg by mouth daily. 11/26/18   [provider]  gabapentin (NEURONTIN) 300 MG capsule Take 600 mg by mouth at bedtime. 12/10/18   [provider]  glipiZIDE (GLUCOTROL XL) 10 MG 24 hr tablet Take 10 mg by mouth daily. 11/26/18   [provider]  lisinopril (ZESTRIL) 10 MG tablet Take 10 mg by mouth daily. 12/10/18   [provider]  metFORMIN (GLUCOPHAGE) 1000 MG tablet Take 1,000 mg by mouth 2 (two) times daily with a meal. 11/26/18   [provider]    Physical Exam: Vitals:   12/17/18 0351 12/17/18 0352 12/17/18 0353 12/17/18 0408  BP:   (!) 151/75 138/77  Pulse: (!) 115 (!) 106 96 90  Resp: (!) 27 (!) 21 (!) 24 (!) 25  Temp:    98.6 F (37 C)  TempSrc:    Oral  SpO2: 95% 97% 98%   Weight:    70.5 kg  Height:    5\' 6"  (1.676 m)    Constitutional: NAD, tremulous  Eyes: PERTLA, lids and  conjunctivae normal ENMT: Mucous membranes are moist. Posterior pharynx clear of any exudate or lesions.   Neck: normal, supple, no masses, no thyromegaly Respiratory: no wheezing, no crackles. Normal respiratory effort. No accessory muscle use.  Cardiovascular: S1 & S2 heard, regular rate and rhythm. No extremity edema.  Abdomen: No distension, no tenderness, soft. Bowel sounds active.  Musculoskeletal: no clubbing / cyanosis. No joint deformity upper and lower extremities.    Skin: no significant rashes, lesions, ulcers. Warm, dry, well-perfused. Neurologic: CN 2-12 grossly intact. Sensation intact. Strength 5/5 in all 4 limbs.   Psychiatric:  Alert and oriented x 3. Pleasant, cooperative.    Labs on Admission: I have personally reviewed following labs and imaging studies  CBC: Recent Labs  Lab 12/16/18 1602  WBC 4.9  NEUTROABS 3.8  HGB 11.7*  HCT 33.8*  MCV 81.8  PLT 144*   Basic Metabolic Panel: Recent Labs  Lab 12/16/18 1602  NA 130*  K 4.1  CL 101  CO2 16*  GLUCOSE 240*  BUN 54*  CREATININE 2.59*  CALCIUM 7.7*   GFR: Estimated Creatinine Clearance: 24.6 mL/min (A) (by C-G formula based on SCr of 2.59 mg/dL (H)). Liver Function Tests: Recent Labs  Lab 12/16/18 1602  AST 29  ALT 25  ALKPHOS 34*  BILITOT 0.8  PROT 6.9  ALBUMIN 3.5   No results for input(s): LIPASE, AMYLASE in the last 168 hours. No results for input(s): AMMONIA in the last 168 hours. Coagulation Profile: No results for input(s): INR, PROTIME in the last 168 hours. Cardiac Enzymes: No results for input(s): CKTOTAL, CKMB, CKMBINDEX, TROPONINI in the last 168 hours. BNP (last 3 results) No results for input(s): PROBNP in the last 8760 hours. HbA1C: No results for input(s): HGBA1C in the last 72 hours. CBG: No results for input(s): GLUCAP in the last 168 hours. Lipid Profile: No results for input(s): CHOL, HDL, LDLCALC, TRIG, CHOLHDL, LDLDIRECT in the last 72 hours. Thyroid Function Tests: No results for input(s): TSH, T4TOTAL, FREET4, T3FREE, THYROIDAB in the last 72 hours. Anemia Panel: No results for input(s): VITAMINB12, FOLATE, FERRITIN, TIBC, IRON, RETICCTPCT in the last 72 hours. Urine analysis: No results found for: COLORURINE, APPEARANCEUR, LABSPEC, PHURINE, GLUCOSEU, HGBUR, BILIRUBINUR, KETONESUR, PROTEINUR, UROBILINOGEN, NITRITE, LEUKOCYTESUR Sepsis Labs: @LABRCNTIP (procalcitonin:4,lacticidven:4) )No results found for this or any previous visit (from the past 240 hour(s)).   Radiological Exams on Admission: Dg Chest Portable 1 View  Result Date: 12/16/2018 CLINICAL DATA:  Fever.  COVID-19  positive. EXAM: PORTABLE CHEST 1 VIEW COMPARISON:  None. FINDINGS: Cardiomediastinal silhouette is normal. Mediastinal contours appear intact. There is no evidence of focal airspace consolidation, pleural effusion or pneumothorax. Osseous structures are without acute abnormality. Soft tissues are grossly normal. IMPRESSION: No active disease. Electronically Signed   By: 02/15/2019 M.D.   On: 12/16/2018 16:01    EKG: Independently reviewed. Sinus rhythm, rate 74, QTc 459 ms.   Assessment/Plan   1. Acute kidney injury superimposed on CKD III; acidosis  - Presents with aches, malaise, loss of appetite, loose stools, and cough after testing positive for COVID-19 on 10/5, and is found to have SCr of 2.59, up from 1.76 in July 2020  - Likely acute prerenal azotemia in setting of loose stools and anorexia; also had lisinopril increased a week ago  - He was given a liter of NS in ED  - Check UA and FENa, hold lisinopril, continue IVF hydration, start bicarbonate, renally-dose medications, and repeat chem panel    2. COVID-19  infection  - Patient had household contacts with COVID-19, tested positive on 10/5, and now presents with generalized aches, malaise, and cough  - He is slightly tachypneic in ED, not requiring supplemental O2, and CXR appears clear  - Check/trend inflammatory markers, continue supportive care    3. Hyponatremia  - Serum sodium is 130 on admission in the setting of hypovolemia, corrects to 132 when accounting for hyperglycemia   - Treated with 1 liter NS in ED and continued on IVF hydration with NS  - Repeat chem panel in am   4. Hypertension  - BP at goal  - Hold lisinopril until renal function stabilizes   5. Type II DM  - A1c was 10.4% in October 2020  - Managed at home with glipizide and metformin, held on admission  - Check CBG's and use a SSI with Novolog while in hospital     PPE: CAPR, gown, gloves  DVT prophylaxis: sq heparin  Code Status: Full   Family Communication: Discussed with patient  Consults called: None  Admission status: Observation     Briscoe Deutscherimothy S Vasilis Luhman, MD [facility] Pager [phone]  If 7PM-7AM, please contact night-coverage [email or web address] Password [email or web address]  12/17/2018, 4:11 AM

## 2018-12-17 NOTE — Progress Notes (Addendum)
PROGRESS NOTE    Bryan Oconnor  ION:[id number] DOB: [birth year] DOA: 12/17/2018 PCP: Patient, No Pcp Per    Brief Narrative:  68 year old male who presented with body aches, malaise and cough.  He does have significant past medical history for hypertension, type 2 diabetes mellitus with chronic kidney disease stage III.  He was diagnosed with COVID-19 December 10, 2018.  He has been symptomatic for about 7 days prior to hospitalization, with worsening symptoms for about 3 days with loose stools, fevers, generalized aches and cough.  Poor appetite.  Nurses department he was febrile 38.8 C, his oxygen saturation was 99%, he was tachypneic 27 breaths/min, tachycardic 106 bpm with a stable blood pressure,151/75.  He was tremulous, his lungs were clear to auscultation, heart S1-S2 present rhythm, his abdomen was soft, no lower extremity edema. Sodium 130, potassium 4.1, chloride 101, bicarb 16, glucose 240, BUN 54, creatinine 2.59, AST 29, ALT 25, white count 4.9, hemoglobin 11.7, hematocrit 33.8, platelets 144.  Urinalysis specific gravity 1.013, 30 protein, 0-5 white cells, 0-5 red cells, urinary sodium 51.  His chest x-ray was negative for infiltrates.  EKG 74 bpm, normal axis, normal intervals, sinus rhythm, no ST segment or T wave changes.  Patient was admitted to the hospital with a working diagnosis of acute kidney injury on chronic kidney disease related to SARS COVID-19 infection.    Assessment & Plan:   Principal Problem:   Viral pneumonia Active Problems:   Acute renal failure superimposed on stage 3 chronic kidney disease ([place])   COVID-19 virus infection   Hyponatremia   Essential hypertension   Uncontrolled diabetes mellitus with hyperglycemia ([place])   1. Acute viral pneumonia due to SARS COVID 19. Follow chest film personally reviewed with small infiltrate at the right base, patient's respiratory rate has been up to 22 and temperature up to 103.3 F. Elevated inflammatory markers:  ferritin 1,199, CRP 23,5. D dimer 1.48.   Will start patient on Remdesivir and dexamethasone, follow with inflammatory markers. Continue oxymetry monitoring. Add vitamin C and Zinc.   2. Pre-renal AKI on CKD stage 3, complicated with non anion gap metabolic acidosis/ hypomagnesemia/ Hyponatremia. Renal function with serum cr down to 2,29 from 2,59 with K at 4,0, Na 133 and serum bicarbonate at 18. Will continue hydration with bicarb drip at 50 ml per H, will follow on renal panel in am. Avoid hypotension and nephrotoxic medications. IV mag sulfate 2 grams today.   3. HTN. Blood pressure 128 to 244 mmHg systolic, will continue blood pressure monitoring. Holding antihypertensive medications for now.   4. T2DM. Fasting glucose this am up to 183, will continue with insulin sliding scale for glucose cover and monitoring. Systemic steroids may trigger hyperglycemia. Patient is tolerating po well.    5. Dyslipidemia. Continue with atorvastatin.    DVT prophylaxis: enoxaparin   Code Status: full Family Communication: no family at the bedside  Disposition Plan/ discharge barriers: Pending clinical improvement. Patient has a high risk for worsening pneumonia and ARDS, will change to inpatient to continue aggressive treatment.   Body mass index is 25.08 kg/m. Malnutrition Type:      Malnutrition Characteristics:      Nutrition Interventions:     RN Pressure Injury Documentation:     Consultants:     Procedures:     Antimicrobials:   Remdesivir.     Subjective: Patient feeling better than yesterday but not yet back to baseline, continue to have fever and tachypnea, no cough, no  nausea or vomiting and improved po intake.   Objective: Vitals:   12/17/18 0445 12/17/18 0500 12/17/18 0600 12/17/18 0800  BP: (!) 148/80 (!) 143/79  128/68  Pulse: 95 95 99 (!) 110  Resp: (!) 25 (!) 27 (!) 22 (!) 22  Temp:    (!) 103.3 F (39.6 C)  TempSrc:    Oral  SpO2: 95% 94% 96%    Weight:      Height:        Intake/Output Summary (Last 24 hours) at 12/17/2018 0813 Last data filed at 12/17/2018 0435 Gross per 24 hour  Intake -  Output 300 ml  Net -300 ml   Filed Weights   12/17/18 0408  Weight: 70.5 kg    Examination:   General: Not in pain or dyspnea, deconditioned  Neurology: Awake and alert, non focal  E ENT: mild pallor, no icterus, oral mucosa moist Cardiovascular: No JVD. S1-S2 present, rhythmic, no gallops, rubs, or murmurs. No lower extremity edema. Pulmonary: positive breath sounds bilaterally. Gastrointestinal. Abdomen with no organomegaly, non tender, no rebound or guarding Skin. No rashes Musculoskeletal: no joint deformities       Data Reviewed: I have personally reviewed following labs and imaging studies  CBC: Recent Labs  Lab 12/16/18 1602 12/17/18 0550  WBC 4.9 4.1  NEUTROABS 3.8 3.3  HGB 11.7* 11.8*  HCT 33.8* 35.2*  MCV 81.8 84.2  PLT 144* 167   Basic Metabolic Panel: Recent Labs  Lab 12/16/18 1602 12/17/18 0550  NA 130* 133*  K 4.1 4.0  CL 101 103  CO2 16* 18*  GLUCOSE 240* 183*  BUN 54* 51*  CREATININE 2.59* 2.29*  CALCIUM 7.7* 7.8*  MG  --  1.7  PHOS  --  2.6   GFR: Estimated Creatinine Clearance: 27.9 mL/min (A) (by C-G formula based on SCr of 2.29 mg/dL (H)). Liver Function Tests: Recent Labs  Lab 12/16/18 1602 12/17/18 0550  AST 29 39  ALT 25 36  ALKPHOS 34* 35*  BILITOT 0.8 0.4  PROT 6.9 7.0  ALBUMIN 3.5 3.2*   No results for input(s): LIPASE, AMYLASE in the last 168 hours. No results for input(s): AMMONIA in the last 168 hours. Coagulation Profile: No results for input(s): INR, PROTIME in the last 168 hours. Cardiac Enzymes: No results for input(s): CKTOTAL, CKMB, CKMBINDEX, TROPONINI in the last 168 hours. BNP (last 3 results) No results for input(s): PROBNP in the last 8760 hours. HbA1C: No results for input(s): HGBA1C in the last 72 hours. CBG: Recent Labs  Lab 12/17/18 0439   GLUCAP 203*   Lipid Profile: No results for input(s): CHOL, HDL, LDLCALC, TRIG, CHOLHDL, LDLDIRECT in the last 72 hours. Thyroid Function Tests: No results for input(s): TSH, T4TOTAL, FREET4, T3FREE, THYROIDAB in the last 72 hours. Anemia Panel: No results for input(s): VITAMINB12, FOLATE, FERRITIN, TIBC, IRON, RETICCTPCT in the last 72 hours.    Radiology Studies: I have reviewed all of the imaging during this hospital visit personally     Scheduled Meds: . aspirin EC  81 mg Oral Daily  . atorvastatin  40 mg Oral q1800  . finasteride  5 mg Oral Daily  . gabapentin  600 mg Oral QHS  . heparin  5,000 Units Subcutaneous Q8H  . insulin aspart  0-5 Units Subcutaneous QHS  . insulin aspart  0-9 Units Subcutaneous TID WC  . sodium bicarbonate  650 mg Oral BID   Continuous Infusions: . sodium chloride 100 mL/hr at 12/17/18 0435  LOS: 1 day        Gionna Polak Gerome Apley, MD

## 2018-12-17 NOTE — Plan of Care (Signed)

## 2018-12-17 NOTE — Plan of Care (Signed)
[name] arrived from [facility] to [facility] early this morning in stable condition on room air.  No acute events overnight.  First dose Remdesivir given.  See flowsheet for assessment details and MAR for medication administration.  Will continue to monitor.    Problem: Education: Goal: Knowledge of General Education information will improve Description: Including pain rating scale, medication(s)/side effects and non-pharmacologic comfort measures 12/17/2018 0647 by Leota Jacobsen, RN Outcome: Progressing 12/17/2018 0358 by Leota Jacobsen, RN Outcome: Progressing   Problem: Health Behavior/Discharge Planning: Goal: Ability to manage health-related needs will improve 12/17/2018 0647 by Leota Jacobsen, RN Outcome: Progressing 12/17/2018 0358 by Leota Jacobsen, RN Outcome: Progressing   Problem: Clinical Measurements: Goal: Ability to maintain clinical measurements within normal limits will improve 12/17/2018 0647 by Leota Jacobsen, RN Outcome: Progressing 12/17/2018 0358 by Leota Jacobsen, RN Outcome: Progressing Goal: Will remain free from infection 12/17/2018 0647 by Leota Jacobsen, RN Outcome: Progressing 12/17/2018 0358 by Leota Jacobsen, RN Outcome: Progressing Goal: Diagnostic test results will improve 12/17/2018 0647 by Leota Jacobsen, RN Outcome: Progressing 12/17/2018 0358 by Leota Jacobsen, RN Outcome: Progressing Goal: Respiratory complications will improve 12/17/2018 0647 by Leota Jacobsen, RN Outcome: Progressing 12/17/2018 0358 by Leota Jacobsen, RN Outcome: Progressing Goal: Cardiovascular complication will be avoided 12/17/2018 0647 by Leota Jacobsen, RN Outcome: Progressing 12/17/2018 0358 by Leota Jacobsen, RN Outcome: Progressing   Problem: Activity: Goal: Risk for activity intolerance will decrease 12/17/2018 0647 by Leota Jacobsen, RN Outcome: Progressing 12/17/2018 0358 by Leota Jacobsen, RN Outcome: Progressing   Problem: Nutrition: Goal:  Adequate nutrition will be maintained 12/17/2018 0647 by Leota Jacobsen, RN Outcome: Progressing 12/17/2018 0358 by Leota Jacobsen, RN Outcome: Progressing   Problem: Coping: Goal: Level of anxiety will decrease 12/17/2018 0647 by Leota Jacobsen, RN Outcome: Progressing 12/17/2018 0358 by Leota Jacobsen, RN Outcome: Progressing   Problem: Elimination: Goal: Will not experience complications related to bowel motility 12/17/2018 0647 by Leota Jacobsen, RN Outcome: Progressing 12/17/2018 0358 by Leota Jacobsen, RN Outcome: Progressing Goal: Will not experience complications related to urinary retention 12/17/2018 0647 by Leota Jacobsen, RN Outcome: Progressing 12/17/2018 0358 by Leota Jacobsen, RN Outcome: Progressing   Problem: Pain Managment: Goal: General experience of comfort will improve 12/17/2018 0647 by Leota Jacobsen, RN Outcome: Progressing 12/17/2018 0358 by Leota Jacobsen, RN Outcome: Progressing   Problem: Safety: Goal: Ability to remain free from injury will improve 12/17/2018 0647 by Leota Jacobsen, RN Outcome: Progressing 12/17/2018 0358 by Leota Jacobsen, RN Outcome: Progressing   Problem: Skin Integrity: Goal: Risk for impaired skin integrity will decrease 12/17/2018 0647 by Leota Jacobsen, RN Outcome: Progressing 12/17/2018 0358 by Leota Jacobsen, RN Outcome: Progressing   Problem: Education: Goal: Knowledge of risk factors and measures for prevention of condition will improve 12/17/2018 0647 by Leota Jacobsen, RN Outcome: Progressing 12/17/2018 0358 by Leota Jacobsen, RN Outcome: Progressing   Problem: Coping: Goal: Psychosocial and spiritual needs will be supported 12/17/2018 0647 by Leota Jacobsen, RN Outcome: Progressing 12/17/2018 0358 by Leota Jacobsen, RN Outcome: Progressing   Problem: Respiratory: Goal: Will maintain a patent airway 12/17/2018 0647 by Leota Jacobsen, RN Outcome: Progressing 12/17/2018 0358 by Leota Jacobsen,  RN Outcome: Progressing Goal: Complications related to the disease process, condition or treatment will be avoided or minimized 12/17/2018 0647 by Leota Jacobsen, RN Outcome:  Progressing 12/17/2018 0358 by Aundria Rud, RN Outcome: Progressing

## 2018-12-18 LAB — CBC WITH DIFFERENTIAL/PLATELET
Abs Immature Granulocytes: 0.05 10*3/uL (ref 0.00–0.07)
Basophils Absolute: 0 10*3/uL (ref 0.0–0.1)
Basophils Relative: 0 %
Eosinophils Absolute: 0 10*3/uL (ref 0.0–0.5)
Eosinophils Relative: 0 %
HCT: 34.5 % — ABNORMAL LOW (ref 39.0–52.0)
Hemoglobin: 11.7 g/dL — ABNORMAL LOW (ref 13.0–17.0)
Immature Granulocytes: 1 %
Lymphocytes Relative: 17 %
Lymphs Abs: 0.6 10*3/uL — ABNORMAL LOW (ref 0.7–4.0)
MCH: 28.4 pg (ref 26.0–34.0)
MCHC: 33.9 g/dL (ref 30.0–36.0)
MCV: 83.7 fL (ref 80.0–100.0)
Monocytes Absolute: 0.3 10*3/uL (ref 0.1–1.0)
Monocytes Relative: 8 %
Neutro Abs: 2.7 10*3/uL (ref 1.7–7.7)
Neutrophils Relative %: 74 %
Platelets: 197 10*3/uL (ref 150–400)
RBC: 4.12 MIL/uL — ABNORMAL LOW (ref 4.22–5.81)
RDW: 12.7 % (ref 11.5–15.5)
WBC: 3.6 10*3/uL — ABNORMAL LOW (ref 4.0–10.5)
nRBC: 0 % (ref 0.0–0.2)

## 2018-12-18 LAB — GLUCOSE, CAPILLARY
Glucose-Capillary: 261 mg/dL — ABNORMAL HIGH (ref 70–99)
Glucose-Capillary: 274 mg/dL — ABNORMAL HIGH (ref 70–99)
Glucose-Capillary: 403 mg/dL — ABNORMAL HIGH (ref 70–99)
Glucose-Capillary: 344 mg/dL — ABNORMAL HIGH (ref 70–99)

## 2018-12-18 LAB — COMPREHENSIVE METABOLIC PANEL
ALT: 89 U/L — ABNORMAL HIGH (ref 0–44)
AST: 96 U/L — ABNORMAL HIGH (ref 15–41)
Albumin: 3.2 g/dL — ABNORMAL LOW (ref 3.5–5.0)
Alkaline Phosphatase: 37 U/L — ABNORMAL LOW (ref 38–126)
Anion gap: 13 (ref 5–15)
BUN: 40 mg/dL — ABNORMAL HIGH (ref 8–23)
CO2: 21 mmol/L — ABNORMAL LOW (ref 22–32)
Calcium: 8.2 mg/dL — ABNORMAL LOW (ref 8.9–10.3)
Chloride: 100 mmol/L (ref 98–111)
Creatinine, Ser: 1.61 mg/dL — ABNORMAL HIGH (ref 0.61–1.24)
GFR calc Af Amer: 50 mL/min — ABNORMAL LOW (ref >60)
GFR calc non Af Amer: 43 mL/min — ABNORMAL LOW (ref >60)
Glucose, Bld: 232 mg/dL — ABNORMAL HIGH (ref 70–99)
Potassium: 4.8 mmol/L (ref 3.5–5.1)
Sodium: 134 mmol/L — ABNORMAL LOW (ref 135–145)
Total Bilirubin: 0.6 mg/dL (ref 0.3–1.2)
Total Protein: 7 g/dL (ref 6.5–8.1)

## 2018-12-18 LAB — C-REACTIVE PROTEIN: CRP: 25.6 mg/dL — ABNORMAL HIGH (ref <1.0)

## 2018-12-18 LAB — FERRITIN: Ferritin: 2004 ng/mL — ABNORMAL HIGH (ref 24–336)

## 2018-12-18 LAB — MAGNESIUM: Magnesium: 2.6 mg/dL — ABNORMAL HIGH (ref 1.7–2.4)

## 2018-12-18 LAB — PHOSPHORUS: Phosphorus: 2.4 mg/dL — ABNORMAL LOW (ref 2.5–4.6)

## 2018-12-18 LAB — D-DIMER, QUANTITATIVE: D-Dimer, Quant: 2.01 ug/mL-FEU — ABNORMAL HIGH (ref 0.00–0.50)

## 2018-12-18 MED ORDER — INSULIN GLARGINE 100 UNIT/ML ~~LOC~~ SOLN
5.0000 [IU] | Freq: Every day | SUBCUTANEOUS | Status: DC
  Administered 2018-12-18 – 2018-12-19 (×2): 5 [IU] via SUBCUTANEOUS
  Filled 2018-12-18 (×2): qty 0.05

## 2018-12-18 NOTE — Progress Notes (Signed)
PROGRESS NOTE    KAHRON KAUTH  ZOX:[id number] DOB: 25-Jul-1950 DOA: 12/17/2018 PCP: Patient, No Pcp Per    Brief Narrative:  68 year old male who presented with body aches, malaise and cough.  He does have significant past medical history for hypertension, type 2 diabetes mellitus with chronic kidney disease stage III.  He was diagnosed with COVID-19 December 10, 2018.  He has been symptomatic for about 7 days prior to hospitalization, with worsening symptoms for about 3 days with loose stools, fevers, generalized aches and cough.  Poor appetite.  Nurses department he was febrile 38.8 C, his oxygen saturation was 99%, he was tachypneic 27 breaths/min, tachycardic 106 bpm with a stable blood pressure,151/75.  He was tremulous, his lungs were clear to auscultation, heart S1-S2 present rhythm, his abdomen was soft, no lower extremity edema. Sodium 130, potassium 4.1, chloride 101, bicarb 16, glucose 240, BUN 54, creatinine 2.59, AST 29, ALT 25, white count 4.9, hemoglobin 11.7, hematocrit 33.8, platelets 144.  Urinalysis specific gravity 1.013, 30 protein, 0-5 white cells, 0-5 red cells, urinary sodium 51.  His chest x-ray was negative for infiltrates.  EKG 74 bpm, normal axis, normal intervals, sinus rhythm, no ST segment or T wave changes.  Patient was admitted to the hospital with a working diagnosis of acute kidney injury on chronic kidney disease related to SARS COVID-19 infection.    Assessment & Plan:   Principal Problem:   Viral pneumonia Active Problems:   Acute renal failure superimposed on stage 3 chronic kidney disease ([place])   COVID-19 virus infection   Hyponatremia   Essential hypertension   Uncontrolled diabetes mellitus with hyperglycemia (HCC)   Pneumonia due to COVID-19 virus    1. Acute viral pneumonia due to SARS COVID 19. small infiltrate at the right base. Patient is feeling better today, but not yet back to baseline.   RR: 18 Fi02: 21% on room air.  Pulse  oxymetry: 91%  Ferritin: 1,199<2,004 CRP:23.5<25,6 D-Dimer: 1,48<2.01  Continue medical therapy with Remdesivir #2/5 (AST 96 and ALT 89). Continue with IV dexamethasone, vitamin C and Zinc. Continue close monitoring of oxymetry and inflammatory markers.   2. Pre-renal AKI on CKD stage 3, complicated with non anion gap metabolic acidosis/ hypomagnesemia/ Hyponatremia. Renal function with serum cr 1.61 with K at 4,8 and serum bicarbonate at 21. Mg up to 2,6. Will hold on bicarb drip for now and follow renal panel in am. Patient is tolerating po well.    3. HTN. Stable blood pressure at 811/91 mmHg systolic. Continue to hold antihypertensive medications.    4. T2DM with steroid induced hyperglycemia. Fasting glucose this am up to 232. Patient has received 9 units of short acting insulin, will add basal insulin of 5 units for now and continue glucose cover and monitoring with insulin sliding scale.  5. Dyslipidemia. On atorvastatin.    DVT prophylaxis: enoxaparin   Code Status: full Family Communication: no family at the bedside  Disposition Plan/ discharge barriers: Pending clinical improvement.          Body mass index is 25.08 kg/m. Malnutrition Type:      Malnutrition Characteristics:      Nutrition Interventions:     RN Pressure Injury Documentation:     Consultants:     Procedures:     Antimicrobials:   Remdesivir.     Subjective: Patient is feeling better but not yet back to his baseline, no nausea or vomiting and this am no dyspnea or chest pain.  Objective: Vitals:   12/17/18 1800 12/17/18 1909 12/18/18 0412 12/18/18 0714  BP: (!) 146/78 135/75 (!) 156/86 129/78  Pulse: (!) 101 (!) 101 (!) 104 81  Resp: (!) 22 16  18   Temp: 100 F (37.8 C) 99.1 F (37.3 C) 98.5 F (36.9 C) 98.2 F (36.8 C)  TempSrc: Oral Oral Oral Oral  SpO2: 93% 94% 91% 91%  Weight:      Height:        Intake/Output Summary (Last 24 hours) at 12/18/2018  0858 Last data filed at 12/18/2018 0400 Gross per 24 hour  Intake 829.34 ml  Output 1050 ml  Net -220.66 ml   Filed Weights   12/17/18 0408  Weight: 70.5 kg    Examination:   General: Not in pain or dyspnea, deconditioned  Neurology: Awake and alert, non focal  E ENT: mild pallor, no icterus, oral mucosa moist Cardiovascular: No JVD. S1-S2 present, rhythmic, no gallops, rubs, or murmurs. No lower extremity edema. Pulmonary: positive breath sounds bilaterally. Gastrointestinal. Abdomen with no organomegaly, non tender, no rebound or guarding Skin. No rashes Musculoskeletal: no joint deformities     Data Reviewed: I have personally reviewed following labs and imaging studies  CBC: Recent Labs  Lab 12/16/18 1602 12/17/18 0550 12/18/18 0400  WBC 4.9 4.1 3.6*  NEUTROABS 3.8 3.3 2.7  HGB 11.7* 11.8* 11.7*  HCT 33.8* 35.2* 34.5*  MCV 81.8 84.2 83.7  PLT 144* 167 197   Basic Metabolic Panel: Recent Labs  Lab 12/16/18 1602 12/17/18 0550 12/18/18 0400  NA 130* 133* 134*  K 4.1 4.0 4.8  CL 101 103 100  CO2 16* 18* 21*  GLUCOSE 240* 183* 232*  BUN 54* 51* 40*  CREATININE 2.59* 2.29* 1.61*  CALCIUM 7.7* 7.8* 8.2*  MG  --  1.7 2.6*  PHOS  --  2.6 2.4*   GFR: Estimated Creatinine Clearance: 39.6 mL/min (A) (by C-G formula based on SCr of 1.61 mg/dL (H)). Liver Function Tests: Recent Labs  Lab 12/16/18 1602 12/17/18 0550 12/18/18 0400  AST 29 39 96*  ALT 25 36 89*  ALKPHOS 34* 35* 37*  BILITOT 0.8 0.4 0.6  PROT 6.9 7.0 7.0  ALBUMIN 3.5 3.2* 3.2*   No results for input(s): LIPASE, AMYLASE in the last 168 hours. No results for input(s): AMMONIA in the last 168 hours. Coagulation Profile: No results for input(s): INR, PROTIME in the last 168 hours. Cardiac Enzymes: No results for input(s): CKTOTAL, CKMB, CKMBINDEX, TROPONINI in the last 168 hours. BNP (last 3 results) No results for input(s): PROBNP in the last 8760 hours. HbA1C: No results for input(s):  HGBA1C in the last 72 hours. CBG: Recent Labs  Lab 12/17/18 0439 12/17/18 0755 12/17/18 1119 12/17/18 1704 12/17/18 2027  GLUCAP 203* 169* 161* 279* 414*   Lipid Profile: No results for input(s): CHOL, HDL, LDLCALC, TRIG, CHOLHDL, LDLDIRECT in the last 72 hours. Thyroid Function Tests: No results for input(s): TSH, T4TOTAL, FREET4, T3FREE, THYROIDAB in the last 72 hours. Anemia Panel: Recent Labs    12/17/18 0550  FERRITIN 1,199*      Radiology Studies: I have reviewed all of the imaging during this hospital visit personally     Scheduled Meds: . aspirin EC  81 mg Oral Daily  . atorvastatin  40 mg Oral q1800  . dexamethasone (DECADRON) injection  6 mg Intravenous Q24H  . finasteride  5 mg Oral Daily  . gabapentin  600 mg Oral QHS  . heparin  5,000 Units  Subcutaneous Q8H  . insulin aspart  0-5 Units Subcutaneous QHS  . insulin aspart  0-9 Units Subcutaneous TID WC  . vitamin C  500 mg Oral Daily  . zinc sulfate  220 mg Oral Daily   Continuous Infusions: . remdesivir 100 mg in NS 250 mL       LOS: 1 day        Azile Minardi Annett Gula, MD

## 2018-12-18 NOTE — Progress Notes (Signed)
Inpatient Diabetes Program Recommendations  AACE/ADA: New Consensus Statement on Inpatient Glycemic Control   Target Ranges:  Prepandial:   less than 140 mg/dL      Peak postprandial:   less than 180 mg/dL (1-2 hours)      Critically ill patients:  140 - 180 mg/dL   Results for Bryan Oconnor, Bryan Oconnor (MRN [id number]) as of 12/18/2018 13:29  Ref. Range 12/17/2018 07:55 12/17/2018 11:19 12/17/2018 17:04 12/17/2018 20:27 12/18/2018 07:12 12/18/2018 11:21  Glucose-Capillary Latest Ref Range: 70 - 99 mg/dL 169 (H) 161 (H) 279 (H) 414 (H) 261 (H) 274 (H)   Review of Glycemic Control  Diabetes history: DM2 Outpatient Diabetes medications: Glipzide XL 10 mg daily, Metformin 1000 mg BID Current orders for Inpatient glycemic control: Novolog 0-9 units TID with meals, Novolog 0-5 units QHS; Decadron 6 mg Q24H  Inpatient Diabetes Program Recommendations:   Insulin-Basal: If steroids are continued as ordered, please consider ordering Levemir 7 units Q24H. If Levemir ordered as recommended, please keep in mind that as steroids are tapered Levemir will also likely need to be tapered.  Insulin-Meal Coverage: If steroids are continued, please consider ordering Novolog 3 units TID with meals for meal coverage if patient eats at least 50% of meals.  Thanks, Barnie Alderman, RN, MSN, CDE Diabetes Coordinator Inpatient Diabetes Program [phone] (Team Pager from 8am to 5pm)

## 2018-12-19 LAB — CBC WITH DIFFERENTIAL/PLATELET
Abs Immature Granulocytes: 0.03 10*3/uL (ref 0.00–0.07)
Basophils Absolute: 0 10*3/uL (ref 0.0–0.1)
Basophils Relative: 0 %
Eosinophils Absolute: 0 10*3/uL (ref 0.0–0.5)
Eosinophils Relative: 0 %
HCT: 33 % — ABNORMAL LOW (ref 39.0–52.0)
Hemoglobin: 11.2 g/dL — ABNORMAL LOW (ref 13.0–17.0)
Immature Granulocytes: 1 %
Lymphocytes Relative: 14 %
Lymphs Abs: 0.7 10*3/uL (ref 0.7–4.0)
MCH: 28.6 pg (ref 26.0–34.0)
MCHC: 33.9 g/dL (ref 30.0–36.0)
MCV: 84.2 fL (ref 80.0–100.0)
Monocytes Absolute: 0.5 10*3/uL (ref 0.1–1.0)
Monocytes Relative: 10 %
Neutro Abs: 4 10*3/uL (ref 1.7–7.7)
Neutrophils Relative %: 75 %
Platelets: 239 10*3/uL (ref 150–400)
RBC: 3.92 MIL/uL — ABNORMAL LOW (ref 4.22–5.81)
RDW: 12.8 % (ref 11.5–15.5)
WBC: 5.3 10*3/uL (ref 4.0–10.5)
nRBC: 0 % (ref 0.0–0.2)

## 2018-12-19 LAB — COMPREHENSIVE METABOLIC PANEL
ALT: 107 U/L — ABNORMAL HIGH (ref 0–44)
AST: 78 U/L — ABNORMAL HIGH (ref 15–41)
Albumin: 3 g/dL — ABNORMAL LOW (ref 3.5–5.0)
Alkaline Phosphatase: 42 U/L (ref 38–126)
Anion gap: 11 (ref 5–15)
BUN: 50 mg/dL — ABNORMAL HIGH (ref 8–23)
CO2: 22 mmol/L (ref 22–32)
Calcium: 8.1 mg/dL — ABNORMAL LOW (ref 8.9–10.3)
Chloride: 101 mmol/L (ref 98–111)
Creatinine, Ser: 1.76 mg/dL — ABNORMAL HIGH (ref 0.61–1.24)
GFR calc Af Amer: 45 mL/min — ABNORMAL LOW (ref >60)
GFR calc non Af Amer: 39 mL/min — ABNORMAL LOW (ref >60)
Glucose, Bld: 354 mg/dL — ABNORMAL HIGH (ref 70–99)
Potassium: 4.8 mmol/L (ref 3.5–5.1)
Sodium: 134 mmol/L — ABNORMAL LOW (ref 135–145)
Total Bilirubin: 0.6 mg/dL (ref 0.3–1.2)
Total Protein: 6.6 g/dL (ref 6.5–8.1)

## 2018-12-19 LAB — FERRITIN: Ferritin: 2045 ng/mL — ABNORMAL HIGH (ref 24–336)

## 2018-12-19 LAB — GLUCOSE, CAPILLARY
Glucose-Capillary: 287 mg/dL — ABNORMAL HIGH (ref 70–99)
Glucose-Capillary: 311 mg/dL — ABNORMAL HIGH (ref 70–99)
Glucose-Capillary: 401 mg/dL — ABNORMAL HIGH (ref 70–99)
Glucose-Capillary: 227 mg/dL — ABNORMAL HIGH (ref 70–99)

## 2018-12-19 LAB — D-DIMER, QUANTITATIVE: D-Dimer, Quant: 1.05 ug/mL-FEU — ABNORMAL HIGH (ref 0.00–0.50)

## 2018-12-19 LAB — C-REACTIVE PROTEIN: CRP: 15.7 mg/dL — ABNORMAL HIGH (ref <1.0)

## 2018-12-19 MED ORDER — INSULIN ASPART 100 UNIT/ML ~~LOC~~ SOLN
0.0000 [IU] | Freq: Three times a day (TID) | SUBCUTANEOUS | Status: DC
  Administered 2018-12-19: 20 [IU] via SUBCUTANEOUS
  Administered 2018-12-20 (×2): 15 [IU] via SUBCUTANEOUS
  Administered 2018-12-20: 4 [IU] via SUBCUTANEOUS
  Administered 2018-12-21: 3 [IU] via SUBCUTANEOUS
  Administered 2018-12-21 – 2018-12-22 (×3): 4 [IU] via SUBCUTANEOUS

## 2018-12-19 MED ORDER — INSULIN GLARGINE 100 UNIT/ML ~~LOC~~ SOLN
15.0000 [IU] | Freq: Every day | SUBCUTANEOUS | Status: DC
  Administered 2018-12-20: 15 [IU] via SUBCUTANEOUS
  Filled 2018-12-19: qty 0.15

## 2018-12-19 MED ORDER — INSULIN ASPART 100 UNIT/ML ~~LOC~~ SOLN
0.0000 [IU] | Freq: Every day | SUBCUTANEOUS | Status: DC
  Administered 2018-12-19 – 2018-12-20 (×2): 2 [IU] via SUBCUTANEOUS
  Administered 2018-12-21: 4 [IU] via SUBCUTANEOUS

## 2018-12-19 MED ORDER — BENZONATATE 100 MG PO CAPS
200.0000 mg | ORAL_CAPSULE | Freq: Three times a day (TID) | ORAL | Status: DC
  Administered 2018-12-19 – 2018-12-22 (×10): 200 mg via ORAL
  Filled 2018-12-19 (×10): qty 2

## 2018-12-19 MED ORDER — INSULIN GLARGINE 100 UNIT/ML ~~LOC~~ SOLN
10.0000 [IU] | Freq: Once | SUBCUTANEOUS | Status: AC
  Administered 2018-12-19: 10 [IU] via SUBCUTANEOUS
  Filled 2018-12-19: qty 0.1

## 2018-12-19 NOTE — Progress Notes (Signed)
Patient's spouse updated by telephone.  All questions welcomed and answered.  

## 2018-12-19 NOTE — Progress Notes (Addendum)
PROGRESS NOTE  Bryan Oconnor ZOX:[id number] DOB: 10-31-1950 DOA: 12/17/2018  PCP: Patient, No Pcp Per  Brief History/Interval Summary: 68 year old male who presented with body aches, malaise and cough. He does have significant past medical history for hypertension, type 2 diabetes mellitus with chronic kidney disease stage III. He was diagnosed with COVID-19 December 10, 2018. He was symptomatic for about 7 days prior to hospitalization, with worsening symptoms for about 3 days with loose stools, fevers, generalized aches and cough. Patient was admitted to the hospital with a working diagnosis of acute kidney injury on chronic kidney disease related to SARS COVID-19 infection.   Reason for Visit: Acute kidney injury.  Consultants: None  Procedures: None  Antibiotics: Anti-infectives (From admission, onward)   Start     Dose/Rate Route Frequency Ordered Stop   12/18/18 1000  remdesivir 100 mg in sodium chloride 0.9 % 250 mL IVPB     100 mg 500 mL/hr over 30 Minutes Intravenous Every 24 hours 12/17/18 1105 12/22/18 0959   12/17/18 1200  remdesivir 200 mg in sodium chloride 0.9 % 250 mL IVPB     200 mg 500 mL/hr over 30 Minutes Intravenous Once 12/17/18 1105 12/17/18 1403      Subjective/Interval History: Patient states that he is feeling better.  Has a dry cough.  Denies any chest pain.  No nausea vomiting.  Urinating.    Assessment/Plan:  Pneumonia due to COVID-19  COVID-19 Labs  Recent Labs    12/17/18 0550 12/18/18 0400 12/19/18 0252  DDIMER 1.48* 2.01* 1.05*  FERRITIN 1,199* 2,004* 2,045*  CRP 23.5* 25.6* 15.7*   Positive COVID-19 test result on 10/5 under care everywhere.   Fever: No fever since 10/12 Oxygen requirements: Room air.  Saturating in the early 90s Antibacterials: None Remdesivir: Day 3 Steroids: Dexamethasone 6 mg daily Diuretics: Not on a regular basis Actemra: Not given yet Vitamin C and Zinc: Continue DVT Prophylaxis: Subcutaneous  heparin  Patient seems to be stable.  Not requiring any oxygen currently.  Continues to have a dry cough.  Add antitussive agent.  Continue with Remdesivir and steroids.  Inflammatory markers significantly elevated but improved to 15.7 today.  D-dimer improved to 1.05.  Ferritin remains elevated at 2045.  Incentive spirometry mobilization prone positioning.  Acute kidney injury on chronic kidney disease stage III/normal anion gap metabolic acidosis/hypomagnesemia/hyponatremia Based on results available at care everywhere it looks like his creatinine usually is around 1.7.  Patient presented here with's creatinine of 2.59.  Improved to 1.61 yesterday noted to be 1.76 today.  Monitor urine output.  Potassium is normal.  Sodium level is stable.  Magnesium had improved.  Essential hypertension Stable blood pressures.  Continue to hold lisinopril.  Diabetes mellitus type 2 uncontrolled with steroid-induced hyperglycemia Based on care everywhere HbA1c was 10.4 on October 5.  Based on home medication list patient is on metformin and glipizide at home.  Significantly elevated CBGs due to steroids.  We will add long-acting insulin.  Continue SSI.  Dyslipidemia To be on statin.  We will hold it due to transaminitis.  Transaminitis Due to COVID-19.  Monitor closely.  Normocytic anemia No evidence of overt bleeding.  Hemoglobin is stable.   DVT Prophylaxis: Subcutaneous heparin PUD Prophylaxis: Initiate Pepcid Code Status: Full code Family Communication: Discussed with the patient Disposition Plan: Management as outlined above   Medications:  Scheduled: . aspirin EC  81 mg Oral Daily  . atorvastatin  40 mg Oral q1800  . benzonatate  200 mg Oral TID  . dexamethasone (DECADRON) injection  6 mg Intravenous Q24H  . finasteride  5 mg Oral Daily  . gabapentin  600 mg Oral QHS  . heparin  5,000 Units Subcutaneous Q8H  . insulin aspart  0-9 Units Subcutaneous TID WC  . insulin glargine  5 Units  Subcutaneous Daily  . vitamin C  500 mg Oral Daily  . zinc sulfate  220 mg Oral Daily   Continuous: . remdesivir 100 mg in NS 250 mL 100 mg (12/19/18 1032)   ZOX:[id number]:acetaminophen, guaiFENesin, HYDROcodone-acetaminophen, ondansetron **OR** ondansetron (ZOFRAN) IV, polyethylene glycol   Objective:  Vital Signs  Vitals:   12/18/18 1635 12/18/18 2029 12/19/18 0401 12/19/18 0721  BP: 118/73 136/83 138/83 137/84  Pulse:  90 79 86  Resp: 18 18 18 18   Temp: 98.2 F (36.8 C) 97.9 F (36.6 C) 98.2 F (36.8 C) 98.2 F (36.8 C)  TempSrc: Oral Oral Oral Oral  SpO2: 90% 92% 90% 90%  Weight:      Height:        Intake/Output Summary (Last 24 hours) at 12/19/2018 1354 Last data filed at 12/19/2018 0400 Gross per 24 hour  Intake 610 ml  Output 800 ml  Net -190 ml   Filed Weights   12/17/18 0408  Weight: 70.5 kg    General appearance: Awake alert.  In no distress Resp: Mildly tachypneic at rest.  Coarse breath sounds bilaterally with crackles at the bases.  No wheezing or rhonchi Cardio: S1-S2 is normal regular.  No S3-S4.  No rubs murmurs or bruit GI: Abdomen is soft.  Nontender nondistended.  Bowel sounds are present normal.  No masses organomegaly Extremities: No edema.  Full range of motion of lower extremities. Neurologic: Alert and oriented x3.  No focal neurological deficits.    Lab Results:  Data Reviewed: I have personally reviewed following labs and imaging studies  CBC: Recent Labs  Lab 12/16/18 1602 12/17/18 0550 12/18/18 0400 12/19/18 0252  WBC 4.9 4.1 3.6* 5.3  NEUTROABS 3.8 3.3 2.7 4.0  HGB 11.7* 11.8* 11.7* 11.2*  HCT 33.8* 35.2* 34.5* 33.0*  MCV 81.8 84.2 83.7 84.2  PLT 144* 167 197 239    Basic Metabolic Panel: Recent Labs  Lab 12/16/18 1602 12/17/18 0550 12/18/18 0400 12/19/18 0252  NA 130* 133* 134* 134*  K 4.1 4.0 4.8 4.8  CL 101 103 100 101  CO2 16* 18* 21* 22  GLUCOSE 240* 183* 232* 354*  BUN 54* 51* 40* 50*  CREATININE 2.59* 2.29*  1.61* 1.76*  CALCIUM 7.7* 7.8* 8.2* 8.1*  MG  --  1.7 2.6*  --   PHOS  --  2.6 2.4*  --     GFR: Estimated Creatinine Clearance: 36.3 mL/min (A) (by C-G formula based on SCr of 1.76 mg/dL (H)).  Liver Function Tests: Recent Labs  Lab 12/16/18 1602 12/17/18 0550 12/18/18 0400 12/19/18 0252  AST 29 39 96* 78*  ALT 25 36 89* 107*  ALKPHOS 34* 35* 37* 42  BILITOT 0.8 0.4 0.6 0.6  PROT 6.9 7.0 7.0 6.6  ALBUMIN 3.5 3.2* 3.2* 3.0*     CBG: Recent Labs  Lab 12/18/18 1121 12/18/18 1631 12/18/18 2028 12/19/18 0725 12/19/18 1218  GLUCAP 274* 403* 344* 287* 311*     Anemia Panel: Recent Labs    12/18/18 0400 12/19/18 0252  FERRITIN 2,004* 2,045*    Recent Results (from the past 240 hour(s))  MRSA PCR Screening     Status: None  Collection Time: 12/17/18  7:31 AM   Specimen: Nasopharyngeal  Result Value Ref Range Status   MRSA by PCR NEGATIVE NEGATIVE Final    Comment:        The GeneXpert MRSA Assay (FDA approved for NASAL specimens only), is one component of a comprehensive MRSA colonization surveillance program. It is not intended to diagnose MRSA infection nor to guide or monitor treatment for MRSA infections. Performed at [facility], [address] [place], [place] [address]       Radiology Studies: No results found.     LOS: 2 days   Jahmarion Popoff Rito Ehrlich  [facility] Pager on [email or web address]  12/19/2018, 1:54 PM

## 2018-12-19 NOTE — Progress Notes (Signed)
Inpatient Diabetes Program Recommendations  AACE/ADA: New Consensus Statement on Inpatient Glycemic Control (2015)  Target Ranges:  Prepandial:   less than 140 mg/dL      Peak postprandial:   less than 180 mg/dL (1-2 hours)      Critically ill patients:  140 - 180 mg/dL   Lab Results  Component Value Date   GLUCAP 287 (H) 12/19/2018    Review of Glycemic Control Results for Bryan Oconnor, Bryan Oconnor (MRN [id number]) as of 12/19/2018 10:48  Ref. Range 12/18/2018 07:12 12/18/2018 11:21 12/18/2018 16:31 12/18/2018 20:28 12/19/2018 07:25  Glucose-Capillary Latest Ref Range: 70 - 99 mg/dL 261 (H) 274 (H) 403 (H) 344 (H) 287 (H)   DM2 Outpatient Diabetes medications: Glipzide XL 10 mg daily, Metformin 1000 mg BID Current orders for Inpatient glycemic control: Novolog 0-9 units TID with meals, Novolog 0-5 units QHS; Decadron 6 mg Q24H  Inpatient Diabetes Program Recommendations:   Insulin-Basal: If steroids are continued as ordered, please consider ordering Levemir 7 units Q24H. If Levemir ordered as recommended, please keep in mind that as steroids are tapered Levemir will also likely need to be tapered.  Insulin-Meal Coverage: If steroids are continued, please consider ordering Novolog 3 units TID with meals for meal coverage if patient eats at least 50% of meals.  Thank you, Nani Gasser. Eliott Amparan, RN, MSN, CDE  Diabetes Coordinator Inpatient Glycemic Control Team Team Pager [phone] (8am-5pm) 12/19/2018 10:49 AM

## 2018-12-19 NOTE — TOC Initial Note (Signed)
Transition of Care [facility]) - Initial/Assessment Note    Patient Details  Name: Bryan Oconnor MRN: [id number] Date of Birth: [birth year]  Transition of Care [facility]) CM/SW Contact:    Ninfa Meeker, RN Phone Number: [phone] (working remotely)   12/19/2018, 11:59 AM  Clinical Narrative: Patient is a  68 year old male who presented with body aches, malaise and cough. He does have significant past medical history for hypertension, type 2 diabetes mellitus with chronic kidney disease stage III. He was diagnosed with COVID-19 December 10, 2018.Patient presented to ED and was transferred to [facility] [place] [facility] for further treatment. On RA. Case manager will arrange for hospital telephonic follow up appt., patient doesn't have a PCP. Will continue to monitor.        Patient Goals and CMS Choice        Expected Discharge Plan and Services                                                Prior Living Arrangements/Services                       Activities of Daily Living Home Assistive Devices/Equipment: None ADL Screening (condition at time of admission) Patient's cognitive ability adequate to safely complete daily activities?: Yes Is the patient deaf or have difficulty hearing?: No Does the patient have difficulty seeing, even when wearing glasses/contacts?: No Does the patient have difficulty concentrating, remembering, or making decisions?: No Patient able to express need for assistance with ADLs?: Yes Does the patient have difficulty dressing or bathing?: No Independently performs ADLs?: Yes (appropriate for developmental age) Does the patient have difficulty walking or climbing stairs?: No Weakness of Legs: Both Weakness of Arms/Hands: Both  Permission Sought/Granted                  Emotional Assessment              Admission diagnosis:  COVID 19  Patient Active Problem List   Diagnosis Date Noted  . COVID-19 virus infection 12/17/2018  .  Hyponatremia 12/17/2018  . Essential hypertension 12/17/2018  . Uncontrolled diabetes mellitus with hyperglycemia ([place]) 12/17/2018  . Viral pneumonia 12/17/2018  . Pneumonia due to COVID-19 virus 12/17/2018  . Acute renal failure superimposed on stage 3 chronic kidney disease ([place]) 12/16/2018   PCP:  Patient, No Pcp Per Pharmacy:   [facility] [address]), [place] [facility] [place]) [place] [address] Phone: [phone] Fax: [phone]     Social Determinants of Health (SDOH) Interventions    Readmission Risk Interventions No flowsheet data found.

## 2018-12-20 DIAGNOSIS — J1289 Other viral pneumonia: Secondary | ICD-10-CM

## 2018-12-20 DIAGNOSIS — J9601 Acute respiratory failure with hypoxia: Secondary | ICD-10-CM

## 2018-12-20 LAB — COMPREHENSIVE METABOLIC PANEL
ALT: 81 U/L — ABNORMAL HIGH (ref 0–44)
AST: 38 U/L (ref 15–41)
Albumin: 2.8 g/dL — ABNORMAL LOW (ref 3.5–5.0)
Alkaline Phosphatase: 40 U/L (ref 38–126)
Anion gap: 14 (ref 5–15)
BUN: 46 mg/dL — ABNORMAL HIGH (ref 8–23)
CO2: 21 mmol/L — ABNORMAL LOW (ref 22–32)
Calcium: 8.5 mg/dL — ABNORMAL LOW (ref 8.9–10.3)
Chloride: 102 mmol/L (ref 98–111)
Creatinine, Ser: 1.49 mg/dL — ABNORMAL HIGH (ref 0.61–1.24)
GFR calc Af Amer: 55 mL/min — ABNORMAL LOW (ref >60)
GFR calc non Af Amer: 48 mL/min — ABNORMAL LOW (ref >60)
Glucose, Bld: 179 mg/dL — ABNORMAL HIGH (ref 70–99)
Potassium: 4.8 mmol/L (ref 3.5–5.1)
Sodium: 137 mmol/L (ref 135–145)
Total Bilirubin: 0.5 mg/dL (ref 0.3–1.2)
Total Protein: 6.6 g/dL (ref 6.5–8.1)

## 2018-12-20 LAB — CBC WITH DIFFERENTIAL/PLATELET
Abs Immature Granulocytes: 0.05 10*3/uL (ref 0.00–0.07)
Basophils Absolute: 0 10*3/uL (ref 0.0–0.1)
Basophils Relative: 0 %
Eosinophils Absolute: 0 10*3/uL (ref 0.0–0.5)
Eosinophils Relative: 0 %
HCT: 34.2 % — ABNORMAL LOW (ref 39.0–52.0)
Hemoglobin: 11.6 g/dL — ABNORMAL LOW (ref 13.0–17.0)
Immature Granulocytes: 1 %
Lymphocytes Relative: 14 %
Lymphs Abs: 0.8 10*3/uL (ref 0.7–4.0)
MCH: 28.8 pg (ref 26.0–34.0)
MCHC: 33.9 g/dL (ref 30.0–36.0)
MCV: 84.9 fL (ref 80.0–100.0)
Monocytes Absolute: 0.6 10*3/uL (ref 0.1–1.0)
Monocytes Relative: 11 %
Neutro Abs: 4.4 10*3/uL (ref 1.7–7.7)
Neutrophils Relative %: 74 %
Platelets: 300 10*3/uL (ref 150–400)
RBC: 4.03 MIL/uL — ABNORMAL LOW (ref 4.22–5.81)
RDW: 12.6 % (ref 11.5–15.5)
WBC: 5.9 10*3/uL (ref 4.0–10.5)
nRBC: 0 % (ref 0.0–0.2)

## 2018-12-20 LAB — GLUCOSE, CAPILLARY
Glucose-Capillary: 178 mg/dL — ABNORMAL HIGH (ref 70–99)
Glucose-Capillary: 301 mg/dL — ABNORMAL HIGH (ref 70–99)
Glucose-Capillary: 313 mg/dL — ABNORMAL HIGH (ref 70–99)
Glucose-Capillary: 209 mg/dL — ABNORMAL HIGH (ref 70–99)

## 2018-12-20 LAB — D-DIMER, QUANTITATIVE: D-Dimer, Quant: 0.55 ug/mL-FEU — ABNORMAL HIGH (ref 0.00–0.50)

## 2018-12-20 LAB — FERRITIN: Ferritin: 1210 ng/mL — ABNORMAL HIGH (ref 24–336)

## 2018-12-20 LAB — C-REACTIVE PROTEIN: CRP: 10.1 mg/dL — ABNORMAL HIGH (ref <1.0)

## 2018-12-20 MED ORDER — FAMOTIDINE 20 MG PO TABS
20.0000 mg | ORAL_TABLET | Freq: Every day | ORAL | Status: DC
  Administered 2018-12-20 – 2018-12-22 (×3): 20 mg via ORAL
  Filled 2018-12-20 (×3): qty 1

## 2018-12-20 MED ORDER — INSULIN GLARGINE 100 UNIT/ML ~~LOC~~ SOLN
18.0000 [IU] | Freq: Every day | SUBCUTANEOUS | Status: DC
  Administered 2018-12-21 – 2018-12-22 (×2): 18 [IU] via SUBCUTANEOUS
  Filled 2018-12-20 (×2): qty 0.18

## 2018-12-20 MED ORDER — INSULIN ASPART 100 UNIT/ML ~~LOC~~ SOLN
4.0000 [IU] | Freq: Three times a day (TID) | SUBCUTANEOUS | Status: DC
  Administered 2018-12-20 – 2018-12-22 (×6): 4 [IU] via SUBCUTANEOUS

## 2018-12-20 NOTE — Progress Notes (Signed)
PROGRESS NOTE  KAAN TOSH URK:[id number] DOB: 03/17/1950 DOA: 12/17/2018  PCP: Patient, No Pcp Per  Brief History/Interval Summary: 68 year old male who presented with body aches, malaise and cough. He does have significant past medical history for hypertension, type 2 diabetes mellitus with chronic kidney disease stage III. He was diagnosed with COVID-19 December 10, 2018. He was symptomatic for about 7 days prior to hospitalization, with worsening symptoms for about 3 days with loose stools, fevers, generalized aches and cough. Patient was admitted to the hospital with a working diagnosis of acute kidney injury on chronic kidney disease related to SARS COVID-19 infection.   Reason for Visit: Acute kidney injury.  Consultants: None  Procedures: None  Antibiotics: Anti-infectives (From admission, onward)   Start     Dose/Rate Route Frequency Ordered Stop   12/18/18 1000  remdesivir 100 mg in sodium chloride 0.9 % 250 mL IVPB     100 mg 500 mL/hr over 30 Minutes Intravenous Every 24 hours 12/17/18 1105 12/22/18 0959   12/17/18 1200  remdesivir 200 mg in sodium chloride 0.9 % 250 mL IVPB     200 mg 500 mL/hr over 30 Minutes Intravenous Once 12/17/18 1105 12/17/18 1403      Subjective/Interval History: States that he is feeling better.  Continues to have a dry cough which is slightly better.  Denies any chest pain.  No nausea vomiting.  Was able to ambulate in the hallway yesterday.     Assessment/Plan:  Pneumonia due to COVID-19  COVID-19 Labs  Recent Labs    12/18/18 0400 12/19/18 0252 12/20/18 0315  DDIMER 2.01* 1.05* 0.55*  FERRITIN 2,004* 2,045* 1,210*  CRP 25.6* 15.7* 10.1*   Positive COVID-19 test result on 10/5 under care everywhere.   Fever: Remains afebrile Oxygen requirements: Noted to be on nasal cannula 1 to 2 L.  Saturating in the early 90s. Antibacterials: None Remdesivir: Day 4 Steroids: Dexamethasone 6 mg daily Diuretics: Not on a regular  basis Actemra: Not given yet Vitamin C and Zinc: Continue DVT Prophylaxis: Subcutaneous heparin  Patient seems to be stable from a respiratory standpoint.  Noted to requiring 1 to 3 L of oxygen by nasal cannula this morning.  Saturating in the early 90s.  Symptomatically patient is feeling better.  His inflammatory markers are improving.  CRP is down to 10.1 from a peak of 25.6.  Ferritin 1210 today.  D-dimer 0.55.  Continue Remdesivir and steroids.  Continue incentive spirometry, mobilization and prone positioning as much as possible.  Continue antitussive agents.  Acute kidney injury on chronic kidney disease stage III/normal anion gap metabolic acidosis/hypomagnesemia/hyponatremia Based on results available at care everywhere it looks like his creatinine usually is around 1.7.  Patient presented here with's creatinine of 2.59.  Patient was gently hydrated.  Renal function has been improving.  Creatinine is down to 1.49.  Monitor urine output.  Sodium level has improved.  Magnesium had also improved.  Potassium level is pending from this morning.  Essential hypertension Continue to hold lisinopril.  Blood pressure is reasonably well controlled.  Diabetes mellitus type 2 uncontrolled with steroid-induced hyperglycemia Based on care everywhere HbA1c was 10.4 on October 5.  Based on home medication list patient is on metformin and glipizide at home.  Significantly elevated CBGs due to steroids.  Lantus was added yesterday.  Some improvement noted but CBGs still high.  Will further titrate the dose of insulin.  Continue SSI.    Dyslipidemia Holding statin due to transaminitis.  Transaminitis Due to COVID-19.  Monitor closely.  Normocytic anemia No evidence of overt bleeding.  IMA globin remains stable.   DVT Prophylaxis: Subcutaneous heparin PUD Prophylaxis: Pepcid Code Status: Full code Family Communication: Discussed with the patient.  Wife was updated yesterday. Disposition Plan:  Mobilize.  Hopefully home when improved.   Medications:  Scheduled: . aspirin EC  81 mg Oral Daily  . benzonatate  200 mg Oral TID  . dexamethasone (DECADRON) injection  6 mg Intravenous Q24H  . finasteride  5 mg Oral Daily  . gabapentin  600 mg Oral QHS  . heparin  5,000 Units Subcutaneous Q8H  . insulin aspart  0-20 Units Subcutaneous TID WC  . insulin aspart  0-5 Units Subcutaneous QHS  . insulin glargine  15 Units Subcutaneous Daily  . vitamin C  500 mg Oral Daily  . zinc sulfate  220 mg Oral Daily   Continuous: . remdesivir 100 mg in NS 250 mL 100 mg (12/20/18 0902)   UEA:[id number]:acetaminophen, guaiFENesin, HYDROcodone-acetaminophen, ondansetron **OR** ondansetron (ZOFRAN) IV, polyethylene glycol   Objective:  Vital Signs  Vitals:   12/19/18 1639 12/19/18 1913 12/20/18 0426 12/20/18 0740  BP: 132/90 138/84 127/72 116/76  Pulse: 80 81 79 73  Resp: 18 16 18 18   Temp: 98.2 F (36.8 C) 97.8 F (36.6 C) 97.8 F (36.6 C) 98.3 F (36.8 C)  TempSrc: Oral Oral Oral   SpO2: 94% 93% 90% 92%  Weight:      Height:        Intake/Output Summary (Last 24 hours) at 12/20/2018 1153 Last data filed at 12/20/2018 0700 Gross per 24 hour  Intake -  Output 875 ml  Net -875 ml   Filed Weights   12/17/18 0408  Weight: 70.5 kg    General appearance: Awake alert.  In no distress Resp: Not as tachypneic as yesterday.  Coarse breath sounds bilaterally with a few crackles at the bases.  No wheezing or rhonchi.   Cardio: S1-S2 is normal regular.  No S3-S4.  No rubs murmurs or bruit GI: Abdomen is soft.  Nontender nondistended.  Bowel sounds are present normal.  No masses organomegaly Extremities: No edema.  Full range of motion of lower extremities. Neurologic: Alert and oriented x3.  No focal neurological deficits.    Lab Results:  Data Reviewed: I have personally reviewed following labs and imaging studies  CBC: Recent Labs  Lab 12/16/18 1602 12/17/18 0550 12/18/18 0400  12/19/18 0252 12/20/18 0315  WBC 4.9 4.1 3.6* 5.3 5.9  NEUTROABS 3.8 3.3 2.7 4.0 4.4  HGB 11.7* 11.8* 11.7* 11.2* 11.6*  HCT 33.8* 35.2* 34.5* 33.0* 34.2*  MCV 81.8 84.2 83.7 84.2 84.9  PLT 144* 167 197 239 300    Basic Metabolic Panel: Recent Labs  Lab 12/16/18 1602 12/17/18 0550 12/18/18 0400 12/19/18 0252 12/20/18 0315  NA 130* 133* 134* 134* 137  K 4.1 4.0 4.8 4.8 PENDING  CL 101 103 100 101 102  CO2 16* 18* 21* 22 21*  GLUCOSE 240* 183* 232* 354* 179*  BUN 54* 51* 40* 50* 46*  CREATININE 2.59* 2.29* 1.61* 1.76* 1.49*  CALCIUM 7.7* 7.8* 8.2* 8.1* 8.5*  MG  --  1.7 2.6*  --   --   PHOS  --  2.6 2.4*  --   --     GFR: Estimated Creatinine Clearance: 42.8 mL/min (A) (by C-G formula based on SCr of 1.49 mg/dL (H)).  Liver Function Tests: Recent Labs  Lab 12/16/18 1602 12/17/18  0550 12/18/18 0400 12/19/18 0252 12/20/18 0315  AST 29 39 96* 78* 38  ALT 25 36 89* 107* 81*  ALKPHOS 34* 35* 37* 42 40  BILITOT 0.8 0.4 0.6 0.6 0.5  PROT 6.9 7.0 7.0 6.6 6.6  ALBUMIN 3.5 3.2* 3.2* 3.0* 2.8*     CBG: Recent Labs  Lab 12/19/18 1218 12/19/18 1633 12/19/18 2012 12/20/18 0855 12/20/18 1059  GLUCAP 311* 401* 227* 178* 301*     Anemia Panel: Recent Labs    12/19/18 0252 12/20/18 0315  FERRITIN 2,045* 1,210*    Recent Results (from the past 240 hour(s))  MRSA PCR Screening     Status: None   Collection Time: 12/17/18  7:31 AM   Specimen: Nasopharyngeal  Result Value Ref Range Status   MRSA by PCR NEGATIVE NEGATIVE Final    Comment:        The GeneXpert MRSA Assay (FDA approved for NASAL specimens only), is one component of a comprehensive MRSA colonization surveillance program. It is not intended to diagnose MRSA infection nor to guide or monitor treatment for MRSA infections. Performed at [facility], [address] [place], [place] [address]       Radiology Studies: No results found.     LOS: 3 days   Norvil Martensen  [employer] on [email or web address]  12/20/2018, 11:53 AM

## 2018-12-20 NOTE — TOC Progression Note (Signed)
Transition of Care [facility]) - Progression Note    Patient Details  Name: Bryan Oconnor MRN: [id number] Date of Birth: 1951-02-14  Transition of Care [facility]) CM/SW Contact  Loletha Grayer Beverely Pace, RN Phone Number:  [phone] (working remotely) 12/20/2018, 11:42 AM  Clinical Narrative:   Case manager has arranged telephonic hospital follow up appointment for patient with [facility] on Tuesday, November 3,2020 at 1:50pm. Appt placed on AVS. Will continue to monitor for needs.         Expected Discharge Plan and Services     Discharge Planning Services: CM Consult, Follow-up appt scheduled                                           Social Determinants of Health (SDOH) Interventions    Readmission Risk Interventions No flowsheet data found.

## 2018-12-21 LAB — COMPREHENSIVE METABOLIC PANEL
ALT: 65 U/L — ABNORMAL HIGH (ref 0–44)
AST: 27 U/L (ref 15–41)
Albumin: 2.9 g/dL — ABNORMAL LOW (ref 3.5–5.0)
Alkaline Phosphatase: 42 U/L (ref 38–126)
Anion gap: 12 (ref 5–15)
BUN: 41 mg/dL — ABNORMAL HIGH (ref 8–23)
CO2: 23 mmol/L (ref 22–32)
Calcium: 8.9 mg/dL (ref 8.9–10.3)
Chloride: 104 mmol/L (ref 98–111)
Creatinine, Ser: 1.5 mg/dL — ABNORMAL HIGH (ref 0.61–1.24)
GFR calc Af Amer: 55 mL/min — ABNORMAL LOW (ref >60)
GFR calc non Af Amer: 47 mL/min — ABNORMAL LOW (ref >60)
Glucose, Bld: 99 mg/dL (ref 70–99)
Potassium: 4.6 mmol/L (ref 3.5–5.1)
Sodium: 139 mmol/L (ref 135–145)
Total Bilirubin: 0.5 mg/dL (ref 0.3–1.2)
Total Protein: 6.8 g/dL (ref 6.5–8.1)

## 2018-12-21 LAB — GLUCOSE, CAPILLARY
Glucose-Capillary: 93 mg/dL (ref 70–99)
Glucose-Capillary: 135 mg/dL — ABNORMAL HIGH (ref 70–99)
Glucose-Capillary: 196 mg/dL — ABNORMAL HIGH (ref 70–99)
Glucose-Capillary: 310 mg/dL — ABNORMAL HIGH (ref 70–99)

## 2018-12-21 LAB — C-REACTIVE PROTEIN: CRP: 9.2 mg/dL — ABNORMAL HIGH (ref <1.0)

## 2018-12-21 LAB — MAGNESIUM: Magnesium: 1.8 mg/dL (ref 1.7–2.4)

## 2018-12-21 NOTE — Plan of Care (Signed)
Patient weaned to RA. Ambulated in hall with no complications.  Problem: Education: Goal: Knowledge of General Education information will improve Description: Including pain rating scale, medication(s)/side effects and non-pharmacologic comfort measures Outcome: Progressing   Problem: Health Behavior/Discharge Planning: Goal: Ability to manage health-related needs will improve Outcome: Progressing   Problem: Clinical Measurements: Goal: Ability to maintain clinical measurements within normal limits will improve Outcome: Progressing Goal: Will remain free from infection Outcome: Progressing Goal: Diagnostic test results will improve Outcome: Progressing Goal: Respiratory complications will improve Outcome: Progressing Goal: Cardiovascular complication will be avoided Outcome: Progressing   Problem: Activity: Goal: Risk for activity intolerance will decrease Outcome: Progressing   Problem: Nutrition: Goal: Adequate nutrition will be maintained Outcome: Progressing   Problem: Coping: Goal: Level of anxiety will decrease Outcome: Progressing   Problem: Elimination: Goal: Will not experience complications related to bowel motility Outcome: Progressing Goal: Will not experience complications related to urinary retention Outcome: Progressing   Problem: Pain Managment: Goal: General experience of comfort will improve Outcome: Progressing   Problem: Safety: Goal: Ability to remain free from injury will improve Outcome: Progressing   Problem: Skin Integrity: Goal: Risk for impaired skin integrity will decrease Outcome: Progressing   Problem: Education: Goal: Knowledge of risk factors and measures for prevention of condition will improve Outcome: Progressing   Problem: Coping: Goal: Psychosocial and spiritual needs will be supported Outcome: Progressing   Problem: Respiratory: Goal: Will maintain a patent airway Outcome: Progressing Goal: Complications related to  the disease process, condition or treatment will be avoided or minimized Outcome: Progressing

## 2018-12-21 NOTE — Progress Notes (Signed)
SATURATION QUALIFICATIONS: (This note is used to comply with regulatory documentation for home oxygen)  Patient Saturations on Room Air at Rest = 97%  Patient Saturations on Room Air while Ambulating = 94%  

## 2018-12-21 NOTE — Progress Notes (Signed)
PROGRESS NOTE  Bryan ParadiseCharles E Krebbs PIR:[id number] DOB: 08/26/1950 DOA: 12/17/2018  PCP: Patient, No Pcp Per  Brief History/Interval Summary: 68 year old male who presented with body aches, malaise and cough. He does have significant past medical history for hypertension, type 2 diabetes mellitus with chronic kidney disease stage III. He was diagnosed with COVID-19 December 10, 2018. He was symptomatic for about 7 days prior to hospitalization, with worsening symptoms for about 3 days with loose stools, fevers, generalized aches and cough. Patient was admitted to the hospital with a working diagnosis of acute kidney injury on chronic kidney disease related to SARS COVID-19 infection.   Reason for Visit: Acute kidney injury.  Consultants: None  Procedures: None  Antibiotics: Anti-infectives (From admission, onward)   Start     Dose/Rate Route Frequency Ordered Stop   12/18/18 1000  remdesivir 100 mg in sodium chloride 0.9 % 250 mL IVPB     100 mg 500 mL/hr over 30 Minutes Intravenous Every 24 hours 12/17/18 1105 12/21/18 0943   12/17/18 1200  remdesivir 200 mg in sodium chloride 0.9 % 250 mL IVPB     200 mg 500 mL/hr over 30 Minutes Intravenous Once 12/17/18 1105 12/17/18 1403      Subjective/Interval History: Patient continues to have a dry cough.  Slightly better compared to before.  No chest pain.  Shortness of breath is improving.     Assessment/Plan:  Pneumonia due to COVID-19/acute respiratory failure with hypoxia  COVID-19 Labs  Recent Labs    12/19/18 0252 12/20/18 0315 12/21/18 0305  DDIMER 1.05* 0.55*  --   FERRITIN 2,045* 1,210*  --   CRP 15.7* 10.1* 9.2*   Positive COVID-19 test result on 10/5 under care everywhere.   Fever: Remains afebrile Oxygen requirements: Has required 2 L of oxygen by nasal cannula over the last 2 days.  Saturating in the early 90s.   Antibacterials: None Remdesivir: Day 5 Steroids: Dexamethasone 6 mg daily Diuretics: Not on a  regular basis Actemra: Not given yet Vitamin C and Zinc: Continue DVT Prophylaxis: Subcutaneous heparin  Patient is requiring oxygen now but seems to be stable for the most part.  Will need to check ambulatory pulse oximetry.  May need home oxygen.  Continues to have a dry cough.  Continue with antitussive agents.  Inflammatory markers finally starting to improve.  Continue Remdesivir and steroids.  Continues incentive spirometry mobilization from positioning.  D-dimer was 0.55 yesterday.  Acute kidney injury on chronic kidney disease stage III/normal anion gap metabolic acidosis/hypomagnesemia/hyponatremia Based on results available at care everywhere it looks like his creatinine usually is around 1.7.  Patient presented here with's creatinine of 2.59.  Patient was gently hydrated.  Renal function has improved and back to baseline.  Continue to monitor urine output.  Magnesium 1.8.  Potassium 4.6.   Essential hypertension Continue to hold lisinopril.  Blood pressure is reasonably well controlled.  Diabetes mellitus type 2 uncontrolled with steroid-induced hyperglycemia Based on care everywhere HbA1c was 10.4 on October 5.  Based on home medication list patient is on metformin and glipizide at home.  Significantly elevated CBGs due to steroids.  Was started on Lantus.  CBGs have improved.  Continue to monitor.  Continue SSI.    Dyslipidemia Holding statin due to transaminitis.  Transaminitis Due to COVID-19.  LFTs have improved.  Normocytic anemia No evidence of overt bleeding.  Hemoglobin remains stable.    DVT Prophylaxis: Subcutaneous heparin PUD Prophylaxis: Pepcid Code Status: Full code Family Communication:  Discussed with patient.  Wife being updated on a daily basis. Disposition Plan: Mobilize.  Check ambulatory pulse oximetry.  Anticipate discharge tomorrow.   Medications:  Scheduled: . aspirin EC  81 mg Oral Daily  . benzonatate  200 mg Oral TID  . dexamethasone  (DECADRON) injection  6 mg Intravenous Q24H  . famotidine  20 mg Oral Daily  . finasteride  5 mg Oral Daily  . gabapentin  600 mg Oral QHS  . heparin  5,000 Units Subcutaneous Q8H  . insulin aspart  0-20 Units Subcutaneous TID WC  . insulin aspart  0-5 Units Subcutaneous QHS  . insulin aspart  4 Units Subcutaneous TID WC  . insulin glargine  18 Units Subcutaneous Daily  . vitamin C  500 mg Oral Daily  . zinc sulfate  220 mg Oral Daily   Continuous:  GHW:[id number], guaiFENesin, HYDROcodone-acetaminophen, ondansetron **OR** ondansetron (ZOFRAN) IV, polyethylene glycol   Objective:  Vital Signs  Vitals:   12/20/18 1923 12/21/18 0426 12/21/18 0742 12/21/18 0908  BP: (!) 156/88 (!) 140/98 116/77   Pulse: 84 99 (!) 109   Resp: 18 18    Temp: 98.1 F (36.7 C) 99.2 F (37.3 C) 99.9 F (37.7 C)   TempSrc: Oral Oral Oral   SpO2: 91% 90% 90% 94%  Weight:      Height:        Intake/Output Summary (Last 24 hours) at 12/21/2018 1032 Last data filed at 12/21/2018 0900 Gross per 24 hour  Intake 300 ml  Output 2003 ml  Net -1703 ml   Filed Weights   12/17/18 0408  Weight: 70.5 kg    General appearance: Awake alert.  In no distress Resp: Not as tachypneic.  No use of accessory muscles.  Continues to have crackles bilateral bases.  No wheezing or rhonchi.   Cardio: S1-S2 is normal regular.  No S3-S4.  No rubs murmurs or bruit GI: Abdomen is soft.  Nontender nondistended.  Bowel sounds are present normal.  No masses organomegaly Extremities: No edema.  Full range of motion of lower extremities. Neurologic: Alert and oriented x3.  No focal neurological deficits.     Lab Results:  Data Reviewed: I have personally reviewed following labs and imaging studies  CBC: Recent Labs  Lab 12/16/18 1602 12/17/18 0550 12/18/18 0400 12/19/18 0252 12/20/18 0315  WBC 4.9 4.1 3.6* 5.3 5.9  NEUTROABS 3.8 3.3 2.7 4.0 4.4  HGB 11.7* 11.8* 11.7* 11.2* 11.6*  HCT 33.8* 35.2* 34.5*  33.0* 34.2*  MCV 81.8 84.2 83.7 84.2 84.9  PLT 144* 167 197 239 810    Basic Metabolic Panel: Recent Labs  Lab 12/17/18 0550 12/18/18 0400 12/19/18 0252 12/20/18 0315 12/21/18 0305  NA 133* 134* 134* 137 139  K 4.0 4.8 4.8 4.8 4.6  CL 103 100 101 102 104  CO2 18* 21* 22 21* 23  GLUCOSE 183* 232* 354* 179* 99  BUN 51* 40* 50* 46* 41*  CREATININE 2.29* 1.61* 1.76* 1.49* 1.50*  CALCIUM 7.8* 8.2* 8.1* 8.5* 8.9  MG 1.7 2.6*  --   --  1.8  PHOS 2.6 2.4*  --   --   --     GFR: Estimated Creatinine Clearance: 42.5 mL/min (A) (by C-G formula based on SCr of 1.5 mg/dL (H)).  Liver Function Tests: Recent Labs  Lab 12/17/18 0550 12/18/18 0400 12/19/18 0252 12/20/18 0315 12/21/18 0305  AST 39 96* 78* 38 27  ALT 36 89* 107* 81* 65*  ALKPHOS 35* 37*  42 40 42  BILITOT 0.4 0.6 0.6 0.5 0.5  PROT 7.0 7.0 6.6 6.6 6.8  ALBUMIN 3.2* 3.2* 3.0* 2.8* 2.9*     CBG: Recent Labs  Lab 12/20/18 0855 12/20/18 1059 12/20/18 1805 12/20/18 2116 12/21/18 0733  GLUCAP 178* 301* 313* 209* 93     Anemia Panel: Recent Labs    12/19/18 0252 12/20/18 0315  FERRITIN 2,045* 1,210*    Recent Results (from the past 240 hour(s))  MRSA PCR Screening     Status: None   Collection Time: 12/17/18  7:31 AM   Specimen: Nasopharyngeal  Result Value Ref Range Status   MRSA by PCR NEGATIVE NEGATIVE Final    Comment:        The GeneXpert MRSA Assay (FDA approved for NASAL specimens only), is one component of a comprehensive MRSA colonization surveillance program. It is not intended to diagnose MRSA infection nor to guide or monitor treatment for MRSA infections. Performed at [facility], [address] [place], [place] [address]       Radiology Studies: No results found.     LOS: 4 days   Linton Stolp [employer] on [email or web address]  12/21/2018, 10:32 AM

## 2018-12-21 NOTE — Progress Notes (Signed)
   12/21/18 1348  Family/Significant Other Communication  Family/Significant Other Update Called;Updated (spouse)

## 2018-12-21 NOTE — Progress Notes (Signed)
Pt has outstanding consult regarding wish to update advance directive.   As pt is currently admitted to [facility], we are not able to meet the requirement for witnesses to notarize this document.  Spiritual care will explore options with department director for providing this service at [place].      Jerene Pitch, MDiv, [facility]

## 2018-12-22 LAB — GLUCOSE, CAPILLARY
Glucose-Capillary: 185 mg/dL — ABNORMAL HIGH (ref 70–99)
Glucose-Capillary: 182 mg/dL — ABNORMAL HIGH (ref 70–99)

## 2018-12-22 LAB — COMPREHENSIVE METABOLIC PANEL
ALT: 50 U/L — ABNORMAL HIGH (ref 0–44)
AST: 22 U/L (ref 15–41)
Albumin: 2.9 g/dL — ABNORMAL LOW (ref 3.5–5.0)
Alkaline Phosphatase: 46 U/L (ref 38–126)
Anion gap: 12 (ref 5–15)
BUN: 39 mg/dL — ABNORMAL HIGH (ref 8–23)
CO2: 21 mmol/L — ABNORMAL LOW (ref 22–32)
Calcium: 8.7 mg/dL — ABNORMAL LOW (ref 8.9–10.3)
Chloride: 103 mmol/L (ref 98–111)
Creatinine, Ser: 1.43 mg/dL — ABNORMAL HIGH (ref 0.61–1.24)
GFR calc Af Amer: 58 mL/min — ABNORMAL LOW (ref >60)
GFR calc non Af Amer: 50 mL/min — ABNORMAL LOW (ref >60)
Glucose, Bld: 199 mg/dL — ABNORMAL HIGH (ref 70–99)
Potassium: 4.6 mmol/L (ref 3.5–5.1)
Sodium: 136 mmol/L (ref 135–145)
Total Bilirubin: 1 mg/dL (ref 0.3–1.2)
Total Protein: 6.8 g/dL (ref 6.5–8.1)

## 2018-12-22 LAB — C-REACTIVE PROTEIN: CRP: 17.8 mg/dL — ABNORMAL HIGH (ref <1.0)

## 2018-12-22 MED ORDER — FAMOTIDINE 20 MG PO TABS: 20.0000 mg | ORAL_TABLET | Freq: Every day | ORAL | 0 refills | Status: AC

## 2018-12-22 MED ORDER — DEXAMETHASONE 2 MG PO TABS: ORAL_TABLET | ORAL | 0 refills | Status: DC

## 2018-12-22 MED ORDER — BENZONATATE 200 MG PO CAPS: 200.0000 mg | ORAL_CAPSULE | Freq: Three times a day (TID) | ORAL | 0 refills | Status: AC

## 2018-12-22 MED ORDER — ASCORBIC ACID 500 MG PO TABS: 500.0000 mg | ORAL_TABLET | Freq: Every day | ORAL | 0 refills | Status: AC

## 2018-12-22 NOTE — Discharge Instructions (Signed)

## 2018-12-22 NOTE — Discharge Summary (Signed)
[facility]  Physician Discharge Summary   Patient ID: TEAGHAN FORMICA MRN: [id number] DOB/AGE: [birth year] 68 y.o.  Admit date: 12/17/2018 Discharge date: 12/22/2018  PCP: Patient, No Pcp Per  DISCHARGE DIAGNOSES:  Pneumonia due to COVID-19 Acute respiratory failure with hypoxia, resolved Acute kidney injury on chronic kidney disease stage III, improved Essential hypertension Diabetes mellitus type 2, uncontrolled with hyperglycemia Transaminitis  RECOMMENDATIONS FOR OUTPATIENT FOLLOW UP: 1. Outpatient follow-up with PCP 2. Lisinopril placed on hold.    Home Health: None Equipment/Devices: None  CODE STATUS: Full code  DISCHARGE CONDITION: fair  Diet recommendation: Modified carbohydrate  INITIAL HISTORY: 68 year old male who presented with body aches, malaise and cough. He does have significant past medical history for hypertension, type 2 diabetes mellitus with chronic kidney disease stage III. He was diagnosed with COVID-19 December 10, 2018. He was symptomatic for about 7 days prior to hospitalization, with worsening symptoms for about 3 days with loose stools, fevers, generalized aches and cough. Patient was admitted to the hospital with a working diagnosis of acute kidney injury on chronic kidney disease related to SARS COVID-19 infection.   HOSPITAL COURSE:   Pneumonia due to COVID-19/acute respiratory failure with hypoxia Patient was initially hospitalized for acute kidney injury.  Subsequently patient started developing a cough shortness of breath and was noted to be hypoxic.  Chest x-ray suggested pneumonia.  Patient was started on Remdesivir and steroids.  He did have an increase in his inflammatory markers.  Patient started improving.  Is oxygenation improved.  He is ambulating without any difficulty.  Does have some dyspnea.  Overall he feels better.  His CRP is noted to be quite high today however considering his clinical improvement and normal  oxygen saturations we think that he is stable for discharge.  He will be discharged on tapering doses of steroids and his CRP should eventually get better.  Acute kidney injury on chronic kidney disease stage III/normal anion gap metabolic acidosis/hypomagnesemia/hyponatremia Based on results available at care everywhere it looks like his creatinine usually is around 1.7.  Patient presented here with's creatinine of 2.59.  Patient was gently hydrated.  Renal function has improved and back to baseline.  Electrolytes were corrected  Essential hypertension Blood pressure is reasonably well controlled.  Lisinopril to be placed on hold due to elevated creatinine.  Diabetes mellitus type 2 uncontrolled with steroid-induced hyperglycemia Based on care everywhere HbA1c was 10.4 on October 5.    Due to steroids as CBGs were significantly elevated requiring use of Lantus.  Since steroid is being tapered down CBG should improve.  He will be asked to resume his usual home medication regimen.   Dyslipidemia Okay to resume statin  Transaminitis Due to COVID-19.  LFTs have improved.  Normocytic anemia No evidence of overt bleeding.  Hemoglobin remains stable.   Overall stable.  Okay for discharge home today.    PERTINENT LABS:  The results of significant diagnostics from this hospitalization (including imaging, microbiology, ancillary and laboratory) are listed below for reference.    Microbiology: Recent Results (from the past 240 hour(s))  MRSA PCR Screening     Status: None   Collection Time: 12/17/18  7:31 AM   Specimen: Nasopharyngeal  Result Value Ref Range Status   MRSA by PCR NEGATIVE NEGATIVE Final    Comment:        The GeneXpert MRSA Assay (FDA approved for NASAL specimens only), is one component of a comprehensive MRSA colonization surveillance program. It is not intended  to diagnose MRSA infection nor to guide or monitor treatment for MRSA infections. Performed at  [facility], [address] [place], [place] [address]      Labs:  COVID-19 Labs  Recent Labs    12/20/18 0315 12/21/18 0305 12/22/18 0243  DDIMER 0.55*  --   --   FERRITIN 1,210*  --   --   CRP 10.1* 9.2* 17.8*    Positive COVID-19 test result on 10/5 under care everywhere.   Basic Metabolic Panel: Recent Labs  Lab 12/17/18 0550 12/18/18 0400 12/19/18 0252 12/20/18 0315 12/21/18 0305 12/22/18 0243  NA 133* 134* 134* 137 139 136  K 4.0 4.8 4.8 4.8 4.6 4.6  CL 103 100 101 102 104 103  CO2 18* 21* 22 21* 23 21*  GLUCOSE 183* 232* 354* 179* 99 199*  BUN 51* 40* 50* 46* 41* 39*  CREATININE 2.29* 1.61* 1.76* 1.49* 1.50* 1.43*  CALCIUM 7.8* 8.2* 8.1* 8.5* 8.9 8.7*  MG 1.7 2.6*  --   --  1.8  --   PHOS 2.6 2.4*  --   --   --   --    Liver Function Tests: Recent Labs  Lab 12/18/18 0400 12/19/18 0252 12/20/18 0315 12/21/18 0305 12/22/18 0243  AST 96* 78* 38 27 22  ALT 89* 107* 81* 65* 50*  ALKPHOS 37* 42 40 42 46  BILITOT 0.6 0.6 0.5 0.5 1.0  PROT 7.0 6.6 6.6 6.8 6.8  ALBUMIN 3.2* 3.0* 2.8* 2.9* 2.9*   CBC: Recent Labs  Lab 12/16/18 1602 12/17/18 0550 12/18/18 0400 12/19/18 0252 12/20/18 0315  WBC 4.9 4.1 3.6* 5.3 5.9  NEUTROABS 3.8 3.3 2.7 4.0 4.4  HGB 11.7* 11.8* 11.7* 11.2* 11.6*  HCT 33.8* 35.2* 34.5* 33.0* 34.2*  MCV 81.8 84.2 83.7 84.2 84.9  PLT 144* 167 197 239 300    CBG: Recent Labs  Lab 12/21/18 1206 12/21/18 1610 12/21/18 1936 12/22/18 0553 12/22/18 0754  GLUCAP 135* 196* 310* 185* 182*     IMAGING STUDIES Dg Chest 1 View  Result Date: 12/17/2018 CLINICAL DATA:  Fever. EXAM: CHEST  1 VIEW COMPARISON:  December 16, 2018. FINDINGS: The heart size and mediastinal contours are within normal limits. Both lungs are clear. The visualized skeletal structures are unremarkable. IMPRESSION: No active disease. Electronically Signed   By: Lupita RaiderJames  Green Jr M.D.   On: 12/17/2018 08:59   Dg Chest Portable 1 View  Result  Date: 12/16/2018 CLINICAL DATA:  Fever.  COVID-19 positive. EXAM: PORTABLE CHEST 1 VIEW COMPARISON:  None. FINDINGS: Cardiomediastinal silhouette is normal. Mediastinal contours appear intact. There is no evidence of focal airspace consolidation, pleural effusion or pneumothorax. Osseous structures are without acute abnormality. Soft tissues are grossly normal. IMPRESSION: No active disease. Electronically Signed   By: Ted Mcalpineobrinka  Dimitrova M.D.   On: 12/16/2018 16:01    DISCHARGE EXAMINATION: Vitals:   12/21/18 1548 12/21/18 2039 12/22/18 0525 12/22/18 0726  BP: 122/86 139/89 122/80 115/81  Pulse: 89   90  Resp:   17   Temp: 98.3 F (36.8 C) 98 F (36.7 C) 98.4 F (36.9 C) 98.2 F (36.8 C)  TempSrc: Oral Oral Oral Oral  SpO2: 100% 93%  99%  Weight:      Height:       General appearance: Awake alert.  In no distress Resp: Improved aeration bilaterally.  Normal effort at rest.  Coarse breath sounds.  Few crackles at the bases. Cardio: S1-S2 is normal regular.  No  S3-S4.  No rubs murmurs or bruit GI: Abdomen is soft.  Nontender nondistended.  Bowel sounds are present normal.  No masses organomegaly Extremities: No edema.  Full range of motion of lower extremities. Neurologic: Alert and oriented x3.  No focal neurological deficits.    DISPOSITION: Home  Discharge Instructions    Call MD for:  difficulty breathing, headache or visual disturbances   Complete by: As directed    Call MD for:  extreme fatigue   Complete by: As directed    Call MD for:  persistant dizziness or light-headedness   Complete by: As directed    Call MD for:  persistant nausea and vomiting   Complete by: As directed    Call MD for:  severe uncontrolled pain   Complete by: As directed    Call MD for:  temperature >100.4   Complete by: As directed    Discharge instructions   Complete by: As directed    COVID 19 INSTRUCTIONS  - You are felt to be stable enough to no longer require inpatient monitoring,  testing, and treatment, though you will need to follow the recommendations below: - Based on the CDC's non-test criteria for ending self-isolation: You may not return to work/leave the home until at least 21 days since symptom onset AND 3 days without a fever (without taking tylenol, ibuprofen, etc.) AND have improvement in respiratory symptoms. - Do not take NSAID medications (including, but not limited to, ibuprofen, advil, motrin, naproxen, aleve, goody's powder, etc.) - Follow up with your doctor in the next week via telehealth or seek medical attention right away if your symptoms get WORSE.  - Consider donating plasma after you have recovered (either 14 days after a negative test or 28 days after symptoms have completely resolved) because your antibodies to this virus may be helpful to give to others with life-threatening infections. Please go to the website www.[email or web address] if you would like to consider volunteering for plasma donation.    Directions for you at home:  Wear a facemask You should wear a facemask that covers your nose and mouth when you are in the same room with other people and when you visit a healthcare provider. People who live with or visit you should also wear a facemask while they are in the same room with you.  Separate yourself from other people in your home As much as possible, you should stay in a different room from other people in your home. Also, you should use a separate bathroom, if available.  Avoid sharing household items You should not share dishes, drinking glasses, cups, eating utensils, towels, bedding, or other items with other people in your home. After using these items, you should wash them thoroughly with soap and water.  Cover your coughs and sneezes Cover your mouth and nose with a tissue when you cough or sneeze, or you can cough or sneeze into your sleeve. Throw used tissues in a lined trash can, and immediately wash your hands with soap and  water for at least 20 seconds or use an alcohol-based hand rub.  Wash your [employer] your hands often and thoroughly with soap and water for at least 20 seconds. You can use an alcohol-based hand sanitizer if soap and water are not available and if your hands are not visibly dirty. Avoid touching your eyes, nose, and mouth with unwashed hands.  Directions for those who live with, or provide care at home for you:  Limit the number of  people who have contact with the patient If possible, have only one caregiver for the patient. Other household members should stay in another home or place of residence. If this is not possible, they should stay in another room, or be separated from the patient as much as possible. Use a separate bathroom, if available. Restrict visitors who do not have an essential need to be in the home.  Ensure good ventilation Make sure that shared spaces in the home have good air flow, such as from an air conditioner or an opened window, weather permitting.  Wash your hands often Wash your hands often and thoroughly with soap and water for at least 20 seconds. You can use an alcohol based hand sanitizer if soap and water are not available and if your hands are not visibly dirty. Avoid touching your eyes, nose, and mouth with unwashed hands. Use disposable paper towels to dry your hands. If not available, use dedicated cloth towels and replace them when they become wet.  Wear a facemask and gloves Wear a disposable facemask at all times in the room and gloves when you touch or have contact with the patients blood, body fluids, and/or secretions or excretions, such as sweat, saliva, sputum, nasal mucus, vomit, urine, or feces.  Ensure the mask fits over your nose and mouth tightly, and do not touch it during use. Throw out disposable facemasks and gloves after using them. Do not reuse. Wash your hands immediately after removing your facemask and gloves. If your personal  clothing becomes contaminated, carefully remove clothing and launder. Wash your hands after handling contaminated clothing. Place all used disposable facemasks, gloves, and other waste in a lined container before disposing them with other household waste. Remove gloves and wash your hands immediately after handling these items.  Do not share dishes, glasses, or other household items with the patient Avoid sharing household items. You should not share dishes, drinking glasses, cups, eating utensils, towels, bedding, or other items with a patient who is confirmed to have, or being evaluated for, COVID-19 infection. After the person uses these items, you should wash them thoroughly with soap and water.  Wash laundry thoroughly Immediately remove and wash clothes or bedding that have blood, body fluids, and/or secretions or excretions, such as sweat, saliva, sputum, nasal mucus, vomit, urine, or feces, on them. Wear gloves when handling laundry from the patient. Read and follow directions on labels of laundry or clothing items and detergent. In general, wash and dry with the warmest temperatures recommended on the label.  Clean all areas the individual has used often Clean all touchable surfaces, such as counters, tabletops, doorknobs, bathroom fixtures, toilets, phones, keyboards, tablets, and bedside tables, every day. Also, clean any surfaces that may have blood, body fluids, and/or secretions or excretions on them. Wear gloves when cleaning surfaces the patient has come in contact with. Use a diluted bleach solution (e.g., dilute bleach with 1 part bleach and 10 parts water) or a household disinfectant with a label that says EPA-registered for coronaviruses. To make a bleach solution at home, add 1 tablespoon of bleach to 1 quart (4 cups) of water. For a larger supply, add  cup of bleach to 1 gallon (16 cups) of water. Read labels of cleaning products and follow recommendations provided on product  labels. Labels contain instructions for safe and effective use of the cleaning product including precautions you should take when applying the product, such as wearing gloves or eye protection and making sure you  have good ventilation during use of the product. Remove gloves and wash hands immediately after cleaning.  Monitor yourself for signs and symptoms of illness Caregivers and household members are considered close contacts, should monitor their health, and will be asked to limit movement outside of the home to the extent possible. Follow the monitoring steps for close contacts listed on the symptom monitoring form.   If you have additional questions, contact your local health department or call the epidemiologist on call at [phone] (available 24/7). This guidance is subject to change. For the most up-to-date guidance from [facility], please refer to their website: TripMetro.hu   You were cared for by a hospitalist during your hospital stay. If you have any questions about your discharge medications or the care you received while you were in the hospital after you are discharged, you can call the unit and asked to speak with the hospitalist on call if the hospitalist that took care of you is not available. Once you are discharged, your primary care physician will handle any further medical issues. Please note that NO REFILLS for any discharge medications will be authorized once you are discharged, as it is imperative that you return to your primary care physician (or establish a relationship with a primary care physician if you do not have one) for your aftercare needs so that they can reassess your need for medications and monitor your lab values. If you do not have a primary care physician, you can call [phone] for a physician referral.   Increase activity slowly   Complete by: As directed          Allergies as of 12/22/2018       Reactions   Pollen Extract Cough      Medication List    STOP taking these medications   lisinopril 10 MG tablet Commonly known as: ZESTRIL     TAKE these medications   ascorbic acid 500 MG tablet Commonly known as: VITAMIN C Take 1 tablet (500 mg total) by mouth daily for 10 days. Start taking on: December 23, 2018   aspirin EC 81 MG tablet Take 81 mg by mouth daily.   atorvastatin 40 MG tablet Commonly known as: LIPITOR Take 40 mg by mouth daily.   benzonatate 200 MG capsule Commonly known as: TESSALON Take 1 capsule (200 mg total) by mouth 3 (three) times daily.   dexamethasone 2 MG tablet Commonly known as: DECADRON Take 2 tablets once daily for 3 days, then 2 tablets once daily for 3 days, then 1 tablet once daily for 3 days, then STOP.   famotidine 20 MG tablet Commonly known as: PEPCID Take 1 tablet (20 mg total) by mouth daily for 10 days. Start taking on: December 23, 2018   finasteride 5 MG tablet Commonly known as: PROSCAR Take 5 mg by mouth daily.   gabapentin 300 MG capsule Commonly known as: NEURONTIN Take 600 mg by mouth at bedtime.   glipiZIDE 10 MG 24 hr tablet Commonly known as: GLUCOTROL XL Take 10 mg by mouth daily.   metFORMIN 1000 MG tablet Commonly known as: GLUCOPHAGE Take 1,000 mg by mouth 2 (two) times daily with a meal.        Follow-up Information    [facility] Follow up.   Why: You are scheduled for a telephonic hospital follow up appointment on Tuesday, January 08, 2019 at 1:50pm, please be available for the call.   Contact information: [address] [facility]  [place] [address] [phone]          TOTAL DISCHARGE TIME: 35 minutes  Jancie Kercher Rito Ehrlich  [facility] Pager on [email or web address]  12/22/2018, 4:30 PM

## 2018-12-24 LAB — GLUCOSE, CAPILLARY: Glucose-Capillary: 157 mg/dL — ABNORMAL HIGH (ref 70–99)

## 2019-01-02 ENCOUNTER — Emergency Department
Admission: EM | Admit: 2019-01-02 | Discharge: 2019-01-03 | Disposition: A | Discharge status: Other Acute Inpatient Hospital | Payer: PPO | Attending: Emergency Medicine | Admitting: Emergency Medicine

## 2019-01-02 ENCOUNTER — Emergency Department: Payer: PPO

## 2019-01-02 ENCOUNTER — Other Ambulatory Visit: Payer: Self-pay

## 2019-01-02 DIAGNOSIS — E1122 Type 2 diabetes mellitus with diabetic chronic kidney disease: Secondary | ICD-10-CM | POA: Insufficient documentation

## 2019-01-02 DIAGNOSIS — Z7982 Long term (current) use of aspirin: Secondary | ICD-10-CM | POA: Diagnosis not present

## 2019-01-02 DIAGNOSIS — R0902 Hypoxemia: Secondary | ICD-10-CM

## 2019-01-02 DIAGNOSIS — U071 COVID-19: Secondary | ICD-10-CM | POA: Diagnosis not present

## 2019-01-02 DIAGNOSIS — I129 Hypertensive chronic kidney disease with stage 1 through stage 4 chronic kidney disease, or unspecified chronic kidney disease: Secondary | ICD-10-CM | POA: Diagnosis not present

## 2019-01-02 DIAGNOSIS — Z79899 Other long term (current) drug therapy: Secondary | ICD-10-CM | POA: Insufficient documentation

## 2019-01-02 DIAGNOSIS — N183 Chronic kidney disease, stage 3 unspecified: Secondary | ICD-10-CM | POA: Insufficient documentation

## 2019-01-02 DIAGNOSIS — Z7984 Long term (current) use of oral hypoglycemic drugs: Secondary | ICD-10-CM | POA: Insufficient documentation

## 2019-01-02 DIAGNOSIS — E1165 Type 2 diabetes mellitus with hyperglycemia: Secondary | ICD-10-CM | POA: Diagnosis not present

## 2019-01-02 DIAGNOSIS — R0602 Shortness of breath: Secondary | ICD-10-CM

## 2019-01-02 DIAGNOSIS — R000 Tachycardia, unspecified: Secondary | ICD-10-CM | POA: Diagnosis not present

## 2019-01-02 LAB — PHOSPHORUS: Phosphorus: 4.8 mg/dL — ABNORMAL HIGH (ref 2.5–4.6)

## 2019-01-02 LAB — BASIC METABOLIC PANEL
Anion gap: 13 (ref 5–15)
BUN: 31 mg/dL — ABNORMAL HIGH (ref 8–23)
CO2: 23 mmol/L (ref 22–32)
Calcium: 9.3 mg/dL (ref 8.9–10.3)
Chloride: 94 mmol/L — ABNORMAL LOW (ref 98–111)
Creatinine, Ser: 1.23 mg/dL (ref 0.61–1.24)
GFR calc Af Amer: >60 mL/min (ref >60)
GFR calc non Af Amer: 60 mL/min — ABNORMAL LOW (ref >60)
Glucose, Bld: 420 mg/dL — ABNORMAL HIGH (ref 70–99)
Potassium: 4.9 mmol/L (ref 3.5–5.1)
Sodium: 130 mmol/L — ABNORMAL LOW (ref 135–145)

## 2019-01-02 LAB — CBC WITH DIFFERENTIAL/PLATELET
Abs Immature Granulocytes: 0.05 10*3/uL (ref 0.00–0.07)
Basophils Absolute: 0 10*3/uL (ref 0.0–0.1)
Basophils Relative: 0 %
Eosinophils Absolute: 0 10*3/uL (ref 0.0–0.5)
Eosinophils Relative: 1 %
HCT: 36.6 % — ABNORMAL LOW (ref 39.0–52.0)
Hemoglobin: 12.3 g/dL — ABNORMAL LOW (ref 13.0–17.0)
Immature Granulocytes: 1 %
Lymphocytes Relative: 17 %
Lymphs Abs: 1.1 10*3/uL (ref 0.7–4.0)
MCH: 28.2 pg (ref 26.0–34.0)
MCHC: 33.6 g/dL (ref 30.0–36.0)
MCV: 83.9 fL (ref 80.0–100.0)
Monocytes Absolute: 0.2 10*3/uL (ref 0.1–1.0)
Monocytes Relative: 4 %
Neutro Abs: 5.1 10*3/uL (ref 1.7–7.7)
Neutrophils Relative %: 77 %
Platelets: 428 10*3/uL — ABNORMAL HIGH (ref 150–400)
RBC: 4.36 MIL/uL (ref 4.22–5.81)
RDW: 12.3 % (ref 11.5–15.5)
WBC: 6.6 10*3/uL (ref 4.0–10.5)
nRBC: 0 % (ref 0.0–0.2)

## 2019-01-02 LAB — FIBRIN DERIVATIVES D-DIMER (ARMC ONLY): Fibrin derivatives D-dimer (ARMC): 3151.43 ng/mL (FEU) — ABNORMAL HIGH (ref 0.00–499.00)

## 2019-01-02 LAB — C-REACTIVE PROTEIN: CRP: 5.6 mg/dL — ABNORMAL HIGH (ref <1.0)

## 2019-01-02 LAB — BRAIN NATRIURETIC PEPTIDE: B Natriuretic Peptide: 35 pg/mL (ref 0.0–100.0)

## 2019-01-02 LAB — FERRITIN: Ferritin: 678 ng/mL — ABNORMAL HIGH (ref 24–336)

## 2019-01-02 LAB — PROCALCITONIN: Procalcitonin: <0.1 ng/mL

## 2019-01-02 LAB — SARS CORONAVIRUS 2 BY RT PCR (HOSPITAL ORDER, PERFORMED IN ~~LOC~~ HOSPITAL LAB): SARS Coronavirus 2: POSITIVE — AB

## 2019-01-02 LAB — TROPONIN I (HIGH SENSITIVITY): Troponin I (High Sensitivity): 5 ng/L (ref <18)

## 2019-01-02 LAB — MAGNESIUM: Magnesium: 1.6 mg/dL — ABNORMAL LOW (ref 1.7–2.4)

## 2019-01-02 MED ORDER — DEXAMETHASONE SODIUM PHOSPHATE 10 MG/ML IJ SOLN
10.0000 mg | Freq: Once | INTRAMUSCULAR | Status: AC
  Administered 2019-01-02: 19:00:00 10 mg via INTRAVENOUS
  Filled 2019-01-02: qty 1

## 2019-01-02 NOTE — ED Triage Notes (Signed)
Pt arrived to ed via pov from home. Pt o2 sat was initially in 70'S. Pt taken to room on e for triage, placed on 3l Bartholomew and oxygen saturation increased to 94. Pt admitted to [facility] for covid positive results and SOB. Pt initially unable to speak in full sentences but is improving with oxygen. Pt alert x 4. MD present for assessment.

## 2019-01-02 NOTE — ED Notes (Signed)
Date and time results received: 01/02/19 2135  (use smartphrase ".now" to insert current time)  Test: COVID Critical Value: POSITIVE  Name of Provider Notified: [facility]  Orders Received? Or Actions Taken?: Orders Received - See Orders for details

## 2019-01-02 NOTE — ED Notes (Signed)
Pt given crackers peanut butter and milk at this time.

## 2019-01-02 NOTE — ED Provider Notes (Signed)
[facility] Emergency Department Provider Note    ____________________________________________   I have reviewed the triage vital signs and the nursing notes.   HISTORY  Chief Complaint Shortness of Breath   History limited by: Not Limited   HPI Bryan Oconnor is a 68 y.o. male who presents to the emergency department today because of concern for shortness of breath.  The patient states that since being discharged from the hospital roughly 10 days ago he gets intermittent episodes of shortness of breath.  He was admitted for Covid.  He states he has been taking his medications that were prescribed to him at discharge.  He did have some unaccompanied right sided and central back pain today.  Patient denies any fevers.  Denies any history of previous lung disease.   Records reviewed. Per medical record review patient has a history of HTN, DM.   Past Medical History:  Diagnosis Date  . Diabetes mellitus without complication (HCC)   . Hypertension     Patient Active Problem List   Diagnosis Date Noted  . COVID-19 virus infection 12/17/2018  . Hyponatremia 12/17/2018  . Essential hypertension 12/17/2018  . Uncontrolled diabetes mellitus with hyperglycemia (HCC) 12/17/2018  . Viral pneumonia 12/17/2018  . Pneumonia due to COVID-19 virus 12/17/2018  . Acute renal failure superimposed on stage 3 chronic kidney disease (HCC) 12/16/2018    History reviewed. No pertinent surgical history.  Prior to Admission medications   Medication Sig Start Date End Date Taking? Authorizing Provider  aspirin EC 81 MG tablet Take 81 mg by mouth daily.    [provider]  atorvastatin (LIPITOR) 40 MG tablet Take 40 mg by mouth daily. 11/26/18   [provider]  benzonatate (TESSALON) 200 MG capsule Take 1 capsule (200 mg total) by mouth 3 (three) times daily. 12/22/18   [name], MD  dexamethasone (DECADRON) 2 MG tablet Take 2 tablets once daily  for 3 days, then 2 tablets once daily for 3 days, then 1 tablet once daily for 3 days, then STOP. 12/22/18   [name], MD  famotidine (PEPCID) 20 MG tablet Take 1 tablet (20 mg total) by mouth daily for 10 days. 12/23/18 01/02/19  [name], MD  finasteride (PROSCAR) 5 MG tablet Take 5 mg by mouth daily. 11/26/18   [provider]  gabapentin (NEURONTIN) 300 MG capsule Take 600 mg by mouth at bedtime. 12/10/18   [provider]  glipiZIDE (GLUCOTROL XL) 10 MG 24 hr tablet Take 10 mg by mouth daily. 11/26/18   [provider]  metFORMIN (GLUCOPHAGE) 1000 MG tablet Take 1,000 mg by mouth 2 (two) times daily with a meal. 11/26/18   [provider]  vitamin C (VITAMIN C) 500 MG tablet Take 1 tablet (500 mg total) by mouth daily for 10 days. 12/23/18 01/02/19  [name], MD    Allergies Pollen extract  History reviewed. No pertinent family history.  Social History Social History   Tobacco Use  . Smoking status: Never Smoker  . Smokeless tobacco: Never Used  Substance Use Topics  . Alcohol use: Never    Frequency: Never  . Drug use: Not on file    Review of Systems Constitutional: No fever/chills Eyes: No visual changes. ENT: No sore throat. Cardiovascular: Denies chest pain. Respiratory: Positive for shortness of breath. Gastrointestinal: No abdominal pain.  No nausea, no vomiting.  No diarrhea.   Genitourinary: Negative for dysuria. Musculoskeletal: Positive for back pain.  Skin: Negative for  rash. Neurological: Negative for headaches, focal weakness or numbness.  ____________________________________________   PHYSICAL EXAM:  VITAL SIGNS: ED Triage Vitals  Enc Vitals Group     BP 01/02/19 1706 100/62     Pulse Rate 01/02/19 1706 (!) 109     Resp 01/02/19 1706 (!) 30     Temp 01/02/19 1706 97.8 F (36.6 C)     Temp src --      SpO2 01/02/19 1706 98 %     Weight 01/02/19 1713 125 lb (56.7 kg)     Height 01/02/19 1713  5\' 6"  (1.676 m)     Head Circumference --      Peak Flow --      Pain Score 01/02/19 1713 0   Constitutional: Alert and oriented.  Eyes: Conjunctivae are normal.  ENT      Head: Normocephalic and atraumatic.      Nose: No congestion/rhinnorhea.      Mouth/Throat: Mucous membranes are moist.      Neck: No stridor. Cardiovascular: Tachycardia.   Respiratory: Increased respiratory effort. Tachypnea.  Gastrointestinal: Soft and non tender. No rebound. No guarding.  Genitourinary: Deferred Musculoskeletal: Normal range of motion in all extremities.  Neurologic:  Normal speech and language. No gross focal neurologic deficits are appreciated.  Skin:  Skin is warm, dry and intact. No rash noted. Psychiatric: Mood and affect are normal. Speech and behavior are normal. Patient exhibits appropriate insight and judgment.  ____________________________________________    LABS (pertinent positives/negatives)  Trop hs 5 Ferritin 678 CBC wbc 6.6, hgb 12.3, plt 428 BMP na 130, k 4.9, glu 420  ____________________________________________   EKG  I, Nance Pear, attending physician, personally viewed and interpreted this EKG  EKG Time: 1711 Rate: 108 Rhythm: sinus tachycardia Axis: left axis deviation Intervals: qtc 468 QRS: narrow ST changes: no st elevation Impression: abnormal ekg   ____________________________________________    RADIOLOGY  CXR Consistent with COVID pneumonia  ____________________________________________   PROCEDURES  Procedures  ____________________________________________   INITIAL IMPRESSION / ASSESSMENT AND PLAN / ED COURSE  Pertinent labs & imaging results that were available during my care of the patient were reviewed by me and considered in my medical decision making (see chart for details).   Patient presented to the emergency department today because of concerns for shortness of breath.  Patient was discharged from the hospital for  Covid roughly 10 days ago.  Patient's initial oxygen saturation in the 70s.  Patient's oxygen did improve significantly with nasal cannula.  Patient stated he felt better on nasal cannula.  X-ray is concerning for pneumonia likely secondary to Covid.  Given hypoxia do feel patient would benefit from admission.  Discussed plan with patient. ____________________________________________   FINAL CLINICAL IMPRESSION(S) / ED DIAGNOSES  Final diagnoses:  Hypoxia  SOB (shortness of breath)  COVID-19     Note: This dictation was prepared with Dragon dictation. Any transcriptional errors that result from this process are unintentional     Nance Pear, MD 01/02/19 1932

## 2019-01-03 ENCOUNTER — Inpatient Hospital Stay (HOSPITAL_COMMUNITY): Payer: PPO

## 2019-01-03 ENCOUNTER — Encounter (HOSPITAL_COMMUNITY): Payer: Self-pay

## 2019-01-03 ENCOUNTER — Inpatient Hospital Stay (HOSPITAL_COMMUNITY)
Admission: AD | Admit: 2019-01-03 | Discharge: 2019-01-07 | DRG: 177 | Disposition: A | Discharge status: Home / Self Care | Payer: PPO | Source: Other Acute Inpatient Hospital | Attending: Internal Medicine | Admitting: Internal Medicine

## 2019-01-03 DIAGNOSIS — I10 Essential (primary) hypertension: Secondary | ICD-10-CM | POA: Diagnosis not present

## 2019-01-03 DIAGNOSIS — J1289 Other viral pneumonia: Principal | ICD-10-CM

## 2019-01-03 DIAGNOSIS — J9601 Acute respiratory failure with hypoxia: Secondary | ICD-10-CM

## 2019-01-03 DIAGNOSIS — N183 Chronic kidney disease, stage 3 unspecified: Secondary | ICD-10-CM | POA: Diagnosis not present

## 2019-01-03 DIAGNOSIS — Z79899 Other long term (current) drug therapy: Secondary | ICD-10-CM

## 2019-01-03 DIAGNOSIS — Z8619 Personal history of other infectious and parasitic diseases: Secondary | ICD-10-CM | POA: Diagnosis not present

## 2019-01-03 DIAGNOSIS — R0602 Shortness of breath: Secondary | ICD-10-CM

## 2019-01-03 DIAGNOSIS — Z7984 Long term (current) use of oral hypoglycemic drugs: Secondary | ICD-10-CM | POA: Diagnosis not present

## 2019-01-03 DIAGNOSIS — Z7982 Long term (current) use of aspirin: Secondary | ICD-10-CM | POA: Diagnosis not present

## 2019-01-03 DIAGNOSIS — U071 COVID-19: Secondary | ICD-10-CM | POA: Diagnosis not present

## 2019-01-03 DIAGNOSIS — E1165 Type 2 diabetes mellitus with hyperglycemia: Secondary | ICD-10-CM

## 2019-01-03 DIAGNOSIS — R7989 Other specified abnormal findings of blood chemistry: Secondary | ICD-10-CM | POA: Diagnosis not present

## 2019-01-03 DIAGNOSIS — R0902 Hypoxemia: Secondary | ICD-10-CM | POA: Diagnosis not present

## 2019-01-03 DIAGNOSIS — T380X5A Adverse effect of glucocorticoids and synthetic analogues, initial encounter: Secondary | ICD-10-CM | POA: Diagnosis not present

## 2019-01-03 DIAGNOSIS — J8410 Pulmonary fibrosis, unspecified: Secondary | ICD-10-CM | POA: Diagnosis not present

## 2019-01-03 DIAGNOSIS — I129 Hypertensive chronic kidney disease with stage 1 through stage 4 chronic kidney disease, or unspecified chronic kidney disease: Secondary | ICD-10-CM | POA: Diagnosis present

## 2019-01-03 DIAGNOSIS — J1282 Pneumonia due to coronavirus disease 2019: Secondary | ICD-10-CM

## 2019-01-03 DIAGNOSIS — E1122 Type 2 diabetes mellitus with diabetic chronic kidney disease: Secondary | ICD-10-CM | POA: Diagnosis not present

## 2019-01-03 LAB — CBC WITH DIFFERENTIAL/PLATELET
Abs Immature Granulocytes: 0.08 10*3/uL — ABNORMAL HIGH (ref 0.00–0.07)
Basophils Absolute: 0 10*3/uL (ref 0.0–0.1)
Basophils Relative: 0 %
Eosinophils Absolute: 0.1 10*3/uL (ref 0.0–0.5)
Eosinophils Relative: 2 %
HCT: 37 % — ABNORMAL LOW (ref 39.0–52.0)
Hemoglobin: 12.3 g/dL — ABNORMAL LOW (ref 13.0–17.0)
Immature Granulocytes: 1 %
Lymphocytes Relative: 27 %
Lymphs Abs: 2.2 10*3/uL (ref 0.7–4.0)
MCH: 28.3 pg (ref 26.0–34.0)
MCHC: 33.2 g/dL (ref 30.0–36.0)
MCV: 85.1 fL (ref 80.0–100.0)
Monocytes Absolute: 0.7 10*3/uL (ref 0.1–1.0)
Monocytes Relative: 9 %
Neutro Abs: 4.8 10*3/uL (ref 1.7–7.7)
Neutrophils Relative %: 61 %
Platelets: 430 10*3/uL — ABNORMAL HIGH (ref 150–400)
RBC: 4.35 MIL/uL (ref 4.22–5.81)
RDW: 12.5 % (ref 11.5–15.5)
WBC: 7.9 10*3/uL (ref 4.0–10.5)
nRBC: 0 % (ref 0.0–0.2)

## 2019-01-03 LAB — COMPREHENSIVE METABOLIC PANEL
ALT: 23 U/L (ref 0–44)
AST: 9 U/L — ABNORMAL LOW (ref 15–41)
Albumin: 2.9 g/dL — ABNORMAL LOW (ref 3.5–5.0)
Alkaline Phosphatase: 61 U/L (ref 38–126)
Anion gap: 9 (ref 5–15)
BUN: 32 mg/dL — ABNORMAL HIGH (ref 8–23)
CO2: 27 mmol/L (ref 22–32)
Calcium: 9.1 mg/dL (ref 8.9–10.3)
Chloride: 98 mmol/L (ref 98–111)
Creatinine, Ser: 1.26 mg/dL — ABNORMAL HIGH (ref 0.61–1.24)
GFR calc Af Amer: >60 mL/min (ref >60)
GFR calc non Af Amer: 58 mL/min — ABNORMAL LOW (ref >60)
Glucose, Bld: 233 mg/dL — ABNORMAL HIGH (ref 70–99)
Potassium: 4.6 mmol/L (ref 3.5–5.1)
Sodium: 134 mmol/L — ABNORMAL LOW (ref 135–145)
Total Bilirubin: 0.5 mg/dL (ref 0.3–1.2)
Total Protein: 7 g/dL (ref 6.5–8.1)

## 2019-01-03 LAB — RESPIRATORY PANEL BY PCR

## 2019-01-03 LAB — ABO/RH: ABO/RH(D): B POS

## 2019-01-03 LAB — GLUCOSE, CAPILLARY
Glucose-Capillary: 348 mg/dL — ABNORMAL HIGH (ref 70–99)
Glucose-Capillary: 204 mg/dL — ABNORMAL HIGH (ref 70–99)
Glucose-Capillary: 335 mg/dL — ABNORMAL HIGH (ref 70–99)

## 2019-01-03 LAB — FERRITIN: Ferritin: 876 ng/mL — ABNORMAL HIGH (ref 24–336)

## 2019-01-03 LAB — ECHOCARDIOGRAM COMPLETE

## 2019-01-03 LAB — HEMOGLOBIN A1C
Hgb A1c MFr Bld: 11.5 % — ABNORMAL HIGH (ref 4.8–5.6)
Mean Plasma Glucose: 283.35 mg/dL

## 2019-01-03 LAB — SEDIMENTATION RATE: Sed Rate: 78 mm/hr — ABNORMAL HIGH (ref 0–16)

## 2019-01-03 LAB — C-REACTIVE PROTEIN: CRP: 4.5 mg/dL — ABNORMAL HIGH (ref <1.0)

## 2019-01-03 LAB — PROCALCITONIN: Procalcitonin: <0.1 ng/mL

## 2019-01-03 LAB — BRAIN NATRIURETIC PEPTIDE: B Natriuretic Peptide: 19.6 pg/mL (ref 0.0–100.0)

## 2019-01-03 LAB — D-DIMER, QUANTITATIVE: D-Dimer, Quant: 2.52 ug/mL-FEU — ABNORMAL HIGH (ref 0.00–0.50)

## 2019-01-03 MED ORDER — INSULIN ASPART 100 UNIT/ML ~~LOC~~ SOLN
0.0000 [IU] | Freq: Every day | SUBCUTANEOUS | Status: DC
  Administered 2019-01-03: 4 [IU] via SUBCUTANEOUS

## 2019-01-03 MED ORDER — ONDANSETRON HCL 4 MG/2ML IJ SOLN: 4.0000 mg | Freq: Four times a day (QID) | INTRAMUSCULAR | Status: DC | PRN

## 2019-01-03 MED ORDER — INSULIN GLARGINE 100 UNIT/ML ~~LOC~~ SOLN
12.0000 [IU] | Freq: Every day | SUBCUTANEOUS | Status: DC
  Administered 2019-01-03: 12 [IU] via SUBCUTANEOUS
  Filled 2019-01-03: qty 0.12

## 2019-01-03 MED ORDER — METHYLPREDNISOLONE SODIUM SUCC 40 MG IJ SOLR
40.0000 mg | Freq: Two times a day (BID) | INTRAMUSCULAR | Status: DC
  Administered 2019-01-03 – 2019-01-05 (×4): 40 mg via INTRAVENOUS
  Filled 2019-01-03 (×4): qty 1

## 2019-01-03 MED ORDER — HYDROCOD POLST-CPM POLST ER 10-8 MG/5ML PO SUER: 5.0000 mL | Freq: Two times a day (BID) | ORAL | Status: DC | PRN

## 2019-01-03 MED ORDER — SODIUM CHLORIDE 0.9% FLUSH
3.0000 mL | INTRAVENOUS | Status: DC | PRN
  Administered 2019-01-05: 3 mL via INTRAVENOUS
  Filled 2019-01-03: qty 3

## 2019-01-03 MED ORDER — SODIUM CHLORIDE 0.9 % IV SOLN: 250.0000 mL | INTRAVENOUS | Status: DC | PRN

## 2019-01-03 MED ORDER — INSULIN ASPART 100 UNIT/ML ~~LOC~~ SOLN
4.0000 [IU] | Freq: Three times a day (TID) | SUBCUTANEOUS | Status: DC
  Administered 2019-01-03 – 2019-01-04 (×2): 4 [IU] via SUBCUTANEOUS

## 2019-01-03 MED ORDER — ALBUTEROL SULFATE HFA 108 (90 BASE) MCG/ACT IN AERS
2.0000 | INHALATION_SPRAY | Freq: Four times a day (QID) | RESPIRATORY_TRACT | Status: DC | PRN
  Filled 2019-01-03: qty 6.7

## 2019-01-03 MED ORDER — SODIUM CHLORIDE 0.9% FLUSH
3.0000 mL | Freq: Two times a day (BID) | INTRAVENOUS | Status: DC
  Administered 2019-01-03 – 2019-01-07 (×8): 3 mL via INTRAVENOUS

## 2019-01-03 MED ORDER — POLYETHYLENE GLYCOL 3350 17 G PO PACK: 17.0000 g | PACK | Freq: Every day | ORAL | Status: DC | PRN

## 2019-01-03 MED ORDER — ACETAMINOPHEN 325 MG PO TABS: 650.0000 mg | ORAL_TABLET | Freq: Four times a day (QID) | ORAL | Status: DC | PRN

## 2019-01-03 MED ORDER — INSULIN ASPART 100 UNIT/ML ~~LOC~~ SOLN
8.0000 [IU] | Freq: Once | SUBCUTANEOUS | Status: AC
  Administered 2019-01-03: 8 [IU] via INTRAVENOUS
  Filled 2019-01-03: qty 1

## 2019-01-03 MED ORDER — GUAIFENESIN-DM 100-10 MG/5ML PO SYRP: 10.0000 mL | ORAL_SOLUTION | ORAL | Status: DC | PRN

## 2019-01-03 MED ORDER — ENOXAPARIN SODIUM 40 MG/0.4ML ~~LOC~~ SOLN
40.0000 mg | SUBCUTANEOUS | Status: DC
  Administered 2019-01-03 – 2019-01-07 (×5): 40 mg via SUBCUTANEOUS
  Filled 2019-01-03 (×5): qty 0.4

## 2019-01-03 MED ORDER — INSULIN ASPART 100 UNIT/ML ~~LOC~~ SOLN: 0.0000 [IU] | Freq: Three times a day (TID) | SUBCUTANEOUS | Status: DC

## 2019-01-03 MED ORDER — ONDANSETRON HCL 4 MG PO TABS: 4.0000 mg | ORAL_TABLET | Freq: Four times a day (QID) | ORAL | Status: DC | PRN

## 2019-01-03 MED ORDER — INSULIN ASPART 100 UNIT/ML ~~LOC~~ SOLN
0.0000 [IU] | Freq: Three times a day (TID) | SUBCUTANEOUS | Status: DC
  Administered 2019-01-03: 13:00:00 5 [IU] via SUBCUTANEOUS
  Administered 2019-01-03 – 2019-01-04 (×2): 11 [IU] via SUBCUTANEOUS

## 2019-01-03 NOTE — ED Notes (Addendum)
Pt given crackers and applesauce and water at this time, pt resting comfortably, informed waiting to hear back from [facility]. Pt understands and is cooperative,. Pt denies any further needs

## 2019-01-03 NOTE — H&P (Addendum)
HISTORY AND PHYSICAL       PATIENT DETAILS Name: Bryan Oconnor Age: 68 y.o. Sex: male Date of Birth: Jan 25, 1951 Admit Date: 01/03/2019 YKD:[id number], No Pcp Per   Patient coming from: [facility] emergency room   CHIEF COMPLAINT:  Exertional dyspnea for 4 days  HPI: Bryan Oconnor is a 68 y.o. male with medical history significant of DM-2, HTN, CKD stage III-recent hospital stay at [facility] from 10/12-10/17-treated for Covid pneumonia with steroids and remdesivir-presented to [facility] ED with exertional dyspnea that started Sunday evening/Monday morning.  Per patient, since his discharge from the hospital-he was doing well-and was able to walk around his house.  He noticed that on Sunday evening/Monday morning then he was getting short of breath when he was moving around.  This shortness of been gradually worsened to the extent where he was only able to walk a few feet before having to rest.  He denied any orthopnea, PND or lower extremity edema.  Patient denied any chest pain.  He denies any swelling of his lower extremities.  Patient continues to have mostly a dry cough.  There is no fever.  He denies any nausea, vomiting or diarrhea.  ED Course: Found to be hypoxic requiring O2 supplementation 2-3 L of oxygen at [facility] ED-chest x-ray showed significant worsening of his infiltrates.  Subsequently referred to the hospitalist service at [facility].  Note: Lives at: Home Mobility:Independent Chronic Indwelling Foley:no   REVIEW OF SYSTEMS:  Constitutional:   No  weight loss, night sweats,  Fevers, chills  HEENT:    No headaches, Dysphagia,Tooth/dental problems,Sore throat  Cardio-vascular: No chest pain, PND,lower extremity edema, anasarca, palpitations  GI:  No heartburn, indigestion, abdominal pain, nausea, vomiting, diarrhea, melena or hematochezia  Resp: No  hemoptysis,plueritic chest pain.   Skin:  No rash or lesions.  GU:  No  dysuria, change in color of urine, no urgency or frequency.  No flank pain.  Musculoskeletal: No joint pain or swelling.  No decreased range of motion.  No back pain.  Endocrine: No heat intolerance, no cold intolerance, no polyuria, no polydipsia  Psych: No change in mood or affect. No depression or anxiety.  No memory loss.   ALLERGIES:   Allergies  Allergen Reactions  . Pollen Extract Cough    PAST MEDICAL HISTORY: Past Medical History:  Diagnosis Date  . Diabetes mellitus without complication ([place])   . Hypertension     PAST SURGICAL HISTORY: No past surgical history on file.  MEDICATIONS AT HOME: Prior to Admission medications   Medication Sig Start Date End Date Taking? Authorizing Provider  aspirin EC 81 MG tablet Take 81 mg by mouth daily.    [provider]  atorvastatin (LIPITOR) 40 MG tablet Take 40 mg by mouth daily. 11/26/18   [provider]  benzonatate (TESSALON) 200 MG capsule Take 1 capsule (200 mg total) by mouth 3 (three) times daily. 12/22/18   Bonnielee Haff, MD  dexamethasone (DECADRON) 2 MG tablet Take 2 tablets once daily for 3 days, then 2 tablets once daily for 3 days, then 1 tablet once daily for 3 days, then STOP. Patient not taking: Reported on 01/02/2019 12/22/18   Bonnielee Haff, MD  famotidine (PEPCID) 20 MG tablet Take 1 tablet (20 mg total) by mouth daily for 10 days. 12/23/18 01/02/19  Bonnielee Haff, MD  finasteride (PROSCAR) 5 MG tablet Take 5 mg by mouth daily. 11/26/18   [provider]  gabapentin (NEURONTIN) 300 MG capsule Take 600 mg by mouth at bedtime. 12/10/18   [provider]  glipiZIDE (GLUCOTROL XL) 10 MG 24 hr tablet Take 10 mg by mouth daily. 11/26/18   [provider]  metFORMIN (GLUCOPHAGE) 1000 MG tablet Take 1,000 mg by mouth 2 (two) times daily with a meal. 11/26/18   [provider]    FAMILY HISTORY: No family history on file.  SOCIAL HISTORY:  reports that he  has never smoked. He has never used smokeless tobacco. He reports that he does not drink alcohol. No history on file for drug.  PHYSICAL EXAM: Blood pressure 125/89, pulse 86, temperature 98 F (36.7 C), temperature source Oral, resp. rate (!) 21, SpO2 91 %.  General appearance :Awake, alert, not in any distress.  Eyes:, pupils equally reactive to light and accomodation,no scleral icterus.Pink conjunctiva HEENT: Atraumatic and Normocephalic Neck: supple, no JVD.  Resp:Good air entry bilaterally, few bibasilar rales CVS: S1 S2 regular, no murmurs.  GI: Bowel sounds present, Non tender and not distended with no gaurding, rigidity or rebound. Extremities: B/L Lower Ext shows no edema, both legs are warm to touch Neurology:  speech clear,Non focal, sensation is grossly intact. Psychiatric: Normal judgment and insight. Alert and oriented x 3. Normal mood. Musculoskeletal:No digital cyanosis Skin:No Rash, warm and dry Wounds:N/A  LABS ON ADMISSION:  I have personally reviewed following labs and imaging studies  CBC: Recent Labs  Lab 01/02/19 1718 01/03/19 1255  WBC 6.6 7.9  NEUTROABS 5.1 4.8  HGB 12.3* 12.3*  HCT 36.6* 37.0*  MCV 83.9 85.1  PLT 428* 430*    Basic Metabolic Panel: Recent Labs  Lab 01/02/19 1718 01/02/19 1955 01/03/19 1255  NA 130*  --  134*  K 4.9  --  4.6  CL 94*  --  98  CO2 23  --  27  GLUCOSE 420*  --  233*  BUN 31*  --  32*  CREATININE 1.23  --  1.26*  CALCIUM 9.3  --  9.1  MG  --  1.6*  --   PHOS  --  4.8*  --     GFR: Estimated Creatinine Clearance: 45 mL/min (A) (by C-G formula based on SCr of 1.26 mg/dL (H)).  Liver Function Tests: Recent Labs  Lab 01/03/19 1255  AST 9*  ALT 23  ALKPHOS 61  BILITOT 0.5  PROT 7.0  ALBUMIN 2.9*   No results for input(s): LIPASE, AMYLASE in the last 168 hours. No results for input(s): AMMONIA in the last 168 hours.  Coagulation Profile: No results for input(s): INR, PROTIME in the last 168 hours.   Cardiac Enzymes: No results for input(s): CKTOTAL, CKMB, CKMBINDEX, TROPONINI in the last 168 hours.  BNP (last 3 results) No results for input(s): PROBNP in the last 8760 hours.  HbA1C: Recent Labs    01/03/19 1255  HGBA1C 11.5*    CBG: Recent Labs  Lab 01/03/19 1036 01/03/19 1243  GLUCAP 348* 204*    Lipid Profile: No results for input(s): CHOL, HDL, LDLCALC, TRIG, CHOLHDL, LDLDIRECT in the last 72 hours.  Thyroid Function Tests: No results for input(s): TSH, T4TOTAL, FREET4, T3FREE, THYROIDAB in the last 72 hours.  Anemia Panel: Recent Labs    01/02/19 1718 01/03/19 1255  FERRITIN 678* 876*    Urine analysis:    Component Value Date/Time   COLORURINE YELLOW 12/17/2018 0731   APPEARANCEUR HAZY (A) 12/17/2018 0731   LABSPEC 1.013 12/17/2018 4287  PHURINE 5.0 12/17/2018 0731   GLUCOSEU NEGATIVE 12/17/2018 0731   HGBUR SMALL (A) 12/17/2018 0731   BILIRUBINUR NEGATIVE 12/17/2018 0731   KETONESUR NEGATIVE 12/17/2018 0731   PROTEINUR 30 (A) 12/17/2018 0731   NITRITE NEGATIVE 12/17/2018 0731   LEUKOCYTESUR NEGATIVE 12/17/2018 0731    Sepsis Labs: Lactic Acid, Venous No results found for: [place]   Microbiology: Recent Results (from the past 240 hour(s))  SARS Coronavirus 2 by RT PCR (hospital order, performed in [place] hospital lab) Nasopharyngeal Nasopharyngeal Swab     Status: Abnormal   Collection Time: 01/02/19  7:55 PM   Specimen: Nasopharyngeal Swab  Result Value Ref Range Status   SARS Coronavirus 2 POSITIVE (A) NEGATIVE Final    Comment: RESULT CALLED TO, READ BACK BY AND VERIFIED WITH: GRACIE WINEMAN @2135  01/02/19 MJU (NOTE) If result is NEGATIVE SARS-CoV-2 target nucleic acids are NOT DETECTED. The SARS-CoV-2 RNA is generally detectable in upper and lower  respiratory specimens during the acute phase of infection. The lowest  concentration of SARS-CoV-2 viral copies this assay can detect is 250  copies / mL. A negative result  does not preclude SARS-CoV-2 infection  and should not be used as the sole basis for treatment or other  patient management decisions.  A negative result may occur with  improper specimen collection / handling, submission of specimen other  than nasopharyngeal swab, presence of viral mutation(s) within the  areas targeted by this assay, and inadequate number of viral copies  (<250 copies / mL). A negative result must be combined with clinical  observations, patient history, and epidemiological information. If result is POSITIVE SARS-CoV-2 target nucleic acids are DETECTED. The  SARS-CoV-2 RNA is generally detectable in upper and lower  respiratory specimens during the acute phase of infection.  Positive  results are indicative of active infection with SARS-CoV-2.  Clinical  correlation with patient history and other diagnostic information is  necessary to determine patient infection status.  Positive results do  not rule out bacterial infection or co-infection with other viruses. If result is PRESUMPTIVE POSTIVE SARS-CoV-2 nucleic acids MAY BE PRESENT.   A presumptive positive result was obtained on the submitted specimen  and confirmed on repeat testing.  While 2019 novel coronavirus  (SARS-CoV-2) nucleic acids may be present in the submitted sample  additional confirmatory testing may be necessary for epidemiological  and / or clinical management purposes  to differentiate between  SARS-CoV-2 and other Sarbecovirus currently known to infect humans.  If clinically indicated additional testing with an alternate test  methodology [phone]) is a dvised. The SARS-CoV-2 RNA is generally  detectable in upper and lower respiratory specimens during the acute  phase of infection. The expected result is Negative. Fact Sheet for Patients:  StrictlyIdeas.no Fact Sheet for Healthcare Providers: BankingDealers.co.za This test is not yet approved or  cleared by the [place] FDA and has been authorized for detection and/or diagnosis of SARS-CoV-2 by FDA under an Emergency Use Authorization (EUA).  This EUA will remain in effect (meaning this test can be used) for the duration of the COVID-19 declaration under Section 564(b)(1) of the Act, 21 U.S.C. section 360bbb-3(b)(1), unless the authorization is terminated or revoked sooner. Performed at [facility], [address] [place],  [address]       RADIOLOGIC STUDIES ON ADMISSION: Dg Chest Portable 1 View  Result Date: 01/02/2019 CLINICAL DATA:  Short of breath EXAM: PORTABLE CHEST 1 VIEW COMPARISON:  12/17/2018 FINDINGS: Diffuse bilateral interstitial and ground-glass  opacity with peripheral consolidations on the right. No pleural effusion. Normal heart size. No pneumothorax. IMPRESSION: Development of fairly extensive interstitial and ground-glass opacities, consistent with multifocal pneumonia and provided clinical history of COVID-19 positivity. Electronically Signed   By: Donavan Foil M.D.   On: 01/02/2019 18:05    I have personally reviewed images of chest xray-significant bilateral infiltrates  EKG:  Personally reviewed.  Normal sinus rhythm  ASSESSMENT AND PLAN: Acute hypoxic respiratory failure secondary to significant worsening of bilateral lung infiltrates in setting of recent COVID-19 viral pneumonia: Presents with exertional dyspnea-requiring O2 supplementation of approximately 2-3 L-etiology unclear at this point-no signs of volume overload suggestive of CHF-no fever-normal procalcitonin argues against a bacterial superinfection.  Doubt PE given significant lung infiltrates that probably explain worsening hypoxemia.  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  Check ESR-obtain CT chest, echo and respiratory virus panel.  In the meantime, I will place him on steroids and monitor him closely.  May need pulmonary consultation once work-up is  complete.  HTN: Blood pressure currently controlled-monitor without the use of antihypertensives  DM-2 with uncontrolled hyperglycemia (A1c 11.5 on 01/03/2019): Anticipate hyperglycemia secondary to steroids-start Lantus 12 units daily, NovoLog 4 units with meals and SSI.  Hold all oral hypoglycemics.  Follow CBGs and adjust  CKD stage III: Creatinine close to usual baseline-follow.  Further plan will depend as patient's clinical course evolves and further radiologic and laboratory data become available. Patient will be monitored closely.  Above noted plan was discussed with patient face to face at bedside, he was in agreement.   CONSULTS: None   DVT Prophylaxis: Prophylactic Lovenox  Code Status: Full Code  Disposition Plan:  Discharge back home vs SNF possibly in 3-4 days, depending on clinical course  Admission status:  Inpatient  going to tele  The medical decision making on this patient was of high complexity and the patient is at high risk for clinical deterioration, therefore this is a level 3 visit.   Total time spent  55 minutes.Greater than 50% of this time was spent in counseling, explanation of diagnosis, planning of further management, and coordination of care.  Severity of illness:  The appropriate patient status for this patient is INPATIENT. Inpatient status is judged to be reasonable and necessary in order to provide the required intensity of service to ensure the patient's safety. The patient's presenting symptoms, physical exam findings, and initial radiographic and laboratory data in the context of their chronic comorbidities is felt to place them at high risk for further clinical deterioration. Furthermore, it is not anticipated that the patient will be medically stable for discharge from the hospital within 2 midnights of admission. The following factors support the patient status of inpatient.   " The patient's presenting symptoms include exertional dyspnea "  The worrisome physical exam findings include hypoxemia " The initial radiographic and laboratory data are worrisome because of worsening lung infiltrates on chest x-ray " The chronic co-morbidities include recent Covid infection, DM, HTN   * I certify that at the point of admission it is my clinical judgment that the patient will require inpatient hospital care spanning beyond 2 midnights from the point of admission due to high intensity of service, high risk for further deterioration and high frequency of surveillance required.  Oren Binet [facility] Pager [phone]  If 7PM-7AM, please contact night-coverage  Please page via [email or web address]  Go to [email or web address] and use [place]'s universal password to access. If you do  not have the password, please contact the hospital operator.  Locate the [facility] provider you are looking for under [facility] and page to a number that you can be directly reached. If you still have difficulty reaching the provider, please page the [facility] (Director on Call) for the Hospitalists listed on amion for assistance.  01/03/2019, 2:38 PM

## 2019-01-03 NOTE — ED Notes (Signed)
Pt asleep, resting comfortably in bed, VSS.

## 2019-01-03 NOTE — ED Notes (Addendum)
Diabetes coordinator contacted this RN regarding new consult note placed, requested that EDP look over note and use hyperglycemia protocol. EDP, Dr. Jacqualine Code notified in person and verbalizes that he will look over consult note and order appropriately for patient.

## 2019-01-03 NOTE — Progress Notes (Addendum)
Inpatient Diabetes Program Recommendations  AACE/ADA: New Consensus Statement on Inpatient Glycemic Control (2015)  Target Ranges:  Prepandial:   less than 140 mg/dL      Peak postprandial:   less than 180 mg/dL (1-2 hours)      Critically ill patients:  140 - 180 mg/dL   Lab Results  Component Value Date   GLUCAP 157 (H) 12/22/2018    Review of Glycemic Control  Diabetes history: DM 2 Outpatient Diabetes medications: Glipizide 10 mg Daily, Metformin 1000 mg bid Current orders for Inpatient glycemic control: No orders currently waiting transport to [place] on Decadron outpatient. Glucose currently 420  Noted Decadron 10 given to patient in ED  Inpatient Diabetes Program Recommendations:    While waiting in ED. Consider COVID glycemic control order set: Levemir 9 units bid Novolog 0-20 units tid + hs scale  Rob Hickman, RN to discuss plan of care.  Thanks,  Tama Headings RN, MSN, BC-ADM Inpatient Diabetes Coordinator Team Pager [phone] (8a-5p)

## 2019-01-03 NOTE — Plan of Care (Signed)
Spoke with wife to give update on patient status and current POC.  Explained she should receive update call 2x/day (every 12 hour shift).  No further questions at this time.

## 2019-01-03 NOTE — ED Provider Notes (Signed)
Patient still waiting for transport to [facility].  Apparently they have rooms but no staff.  Patient's vitals are improving.  Blood pressure is good temperature is good oxygen saturation is 99% on oxygen.  We will try to call [facility] again later this morning.  Will consider admitting him here otherwise.   Nena Polio, MD 01/03/19 [phone]

## 2019-01-03 NOTE — Progress Notes (Signed)
  Echocardiogram 2D Echocardiogram has been performed.  Randa Lynn [name] 01/03/2019, 6:59 PM

## 2019-01-03 NOTE — ED Notes (Signed)
Report to Ashley, RN

## 2019-01-03 NOTE — ED Notes (Signed)
PT GIVES VERBAL CONSENT FOR TRANSFER 

## 2019-01-03 NOTE — ED Provider Notes (Signed)
Reassessment completed.  Patient alert, oriented, tachypneic but otherwise doing well.  Blood sugar has improved, given IV insulin.  Patient agreeable understanding of plan to transfer to [facility].  Stable for transfer.   Delman Kitten, MD 01/03/19 1125

## 2019-01-04 ENCOUNTER — Telehealth: Payer: Self-pay | Admitting: Internal Medicine

## 2019-01-04 ENCOUNTER — Inpatient Hospital Stay (HOSPITAL_COMMUNITY): Payer: PPO

## 2019-01-04 DIAGNOSIS — U071 COVID-19: Secondary | ICD-10-CM | POA: Diagnosis not present

## 2019-01-04 DIAGNOSIS — R7989 Other specified abnormal findings of blood chemistry: Secondary | ICD-10-CM | POA: Diagnosis not present

## 2019-01-04 LAB — CBC WITH DIFFERENTIAL/PLATELET
Abs Immature Granulocytes: 0.11 10*3/uL — ABNORMAL HIGH (ref 0.00–0.07)
Basophils Absolute: 0 10*3/uL (ref 0.0–0.1)
Basophils Relative: 0 %
Eosinophils Absolute: 0 10*3/uL (ref 0.0–0.5)
Eosinophils Relative: 0 %
HCT: 37.1 % — ABNORMAL LOW (ref 39.0–52.0)
Hemoglobin: 12.1 g/dL — ABNORMAL LOW (ref 13.0–17.0)
Immature Granulocytes: 2 %
Lymphocytes Relative: 18 %
Lymphs Abs: 1.3 10*3/uL (ref 0.7–4.0)
MCH: 27.8 pg (ref 26.0–34.0)
MCHC: 32.6 g/dL (ref 30.0–36.0)
MCV: 85.3 fL (ref 80.0–100.0)
Monocytes Absolute: 0.3 10*3/uL (ref 0.1–1.0)
Monocytes Relative: 4 %
Neutro Abs: 5.5 10*3/uL (ref 1.7–7.7)
Neutrophils Relative %: 76 %
Platelets: 440 10*3/uL — ABNORMAL HIGH (ref 150–400)
RBC: 4.35 MIL/uL (ref 4.22–5.81)
RDW: 12.4 % (ref 11.5–15.5)
WBC: 7.2 10*3/uL (ref 4.0–10.5)
nRBC: 0 % (ref 0.0–0.2)

## 2019-01-04 LAB — COMPREHENSIVE METABOLIC PANEL
ALT: 23 U/L (ref 0–44)
AST: 11 U/L — ABNORMAL LOW (ref 15–41)
Albumin: 2.8 g/dL — ABNORMAL LOW (ref 3.5–5.0)
Alkaline Phosphatase: 65 U/L (ref 38–126)
Anion gap: 12 (ref 5–15)
BUN: 39 mg/dL — ABNORMAL HIGH (ref 8–23)
CO2: 24 mmol/L (ref 22–32)
Calcium: 9.2 mg/dL (ref 8.9–10.3)
Chloride: 98 mmol/L (ref 98–111)
Creatinine, Ser: 1.33 mg/dL — ABNORMAL HIGH (ref 0.61–1.24)
GFR calc Af Amer: >60 mL/min (ref >60)
GFR calc non Af Amer: 55 mL/min — ABNORMAL LOW (ref >60)
Glucose, Bld: 226 mg/dL — ABNORMAL HIGH (ref 70–99)
Potassium: 5.1 mmol/L (ref 3.5–5.1)
Sodium: 134 mmol/L — ABNORMAL LOW (ref 135–145)
Total Bilirubin: 0.3 mg/dL (ref 0.3–1.2)
Total Protein: 7 g/dL (ref 6.5–8.1)

## 2019-01-04 LAB — GLUCOSE, CAPILLARY
Glucose-Capillary: 322 mg/dL — ABNORMAL HIGH (ref 70–99)
Glucose-Capillary: 344 mg/dL — ABNORMAL HIGH (ref 70–99)
Glucose-Capillary: 410 mg/dL — ABNORMAL HIGH (ref 70–99)

## 2019-01-04 LAB — FERRITIN: Ferritin: 818 ng/mL — ABNORMAL HIGH (ref 24–336)

## 2019-01-04 LAB — D-DIMER, QUANTITATIVE: D-Dimer, Quant: 1.93 ug/mL-FEU — ABNORMAL HIGH (ref 0.00–0.50)

## 2019-01-04 MED ORDER — INSULIN ASPART 100 UNIT/ML ~~LOC~~ SOLN
0.0000 [IU] | Freq: Three times a day (TID) | SUBCUTANEOUS | Status: DC
  Administered 2019-01-04: 7 [IU] via SUBCUTANEOUS
  Administered 2019-01-04: 13:00:00 20 [IU] via SUBCUTANEOUS
  Administered 2019-01-05 (×2): 15 [IU] via SUBCUTANEOUS
  Administered 2019-01-06 – 2019-01-07 (×3): 4 [IU] via SUBCUTANEOUS

## 2019-01-04 MED ORDER — INSULIN ASPART 100 UNIT/ML ~~LOC~~ SOLN
0.0000 [IU] | Freq: Every day | SUBCUTANEOUS | Status: DC
  Administered 2019-01-04: 2 [IU] via SUBCUTANEOUS
  Administered 2019-01-05: 4 [IU] via SUBCUTANEOUS

## 2019-01-04 MED ORDER — INSULIN ASPART 100 UNIT/ML ~~LOC~~ SOLN
10.0000 [IU] | Freq: Three times a day (TID) | SUBCUTANEOUS | Status: DC
  Administered 2019-01-04 – 2019-01-07 (×9): 10 [IU] via SUBCUTANEOUS

## 2019-01-04 MED ORDER — INSULIN GLARGINE 100 UNIT/ML ~~LOC~~ SOLN
25.0000 [IU] | Freq: Every day | SUBCUTANEOUS | Status: DC
  Administered 2019-01-04 – 2019-01-06 (×3): 25 [IU] via SUBCUTANEOUS
  Filled 2019-01-04 (×4): qty 0.25

## 2019-01-04 MED ORDER — FUROSEMIDE 10 MG/ML IJ SOLN
40.0000 mg | Freq: Once | INTRAMUSCULAR | Status: AC
  Administered 2019-01-04: 13:00:00 40 mg via INTRAVENOUS
  Filled 2019-01-04: qty 4

## 2019-01-04 NOTE — Telephone Encounter (Signed)
Bryan Oconnor   This patient had covid first posiive ttest 12/17/2018 per notes. Then readmitted now with positive test 01/02/2019. Please give patient followup to pulmonary clinic 3 weeks from 01/02/2019 but if he wants sooner then 3 weeks from 12/17/2018

## 2019-01-04 NOTE — Progress Notes (Signed)
Venous duplex lower ext  has been completed. Refer to [facility] under chart review to view preliminary results.   01/04/2019  1:32 PM Edwin Baines, Bonnye Fava

## 2019-01-04 NOTE — Evaluation (Signed)
Physical Therapy Evaluation Patient Details Name: Bryan Oconnor MRN: [id number] DOB: [birth year] Today's Date: 01/04/2019   History of Present Illness  Patient is a 68 y.o. 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  Clinical Impression  The patient  Received on RA, ambulated x 30' on RA with noticeable DOE 4/4 and SPO2 86%( ear probe).  Patient rested, placed on 2 L [place] , then ambulated x 120', HR 101-135, encouraged pursed lip breaths as patient noted to take small shallow breaths. Ambulated again on RA x 60' with SPO2 down to 87%.  Patient instructed and performed IS and Flutter valve. PT will continue ambulation and monitor VS and need for supplemental O2. Pt admitted with above diagnosis.  Pt currently with functional limitations due to the deficits listed below (see PT Problem List). Pt will benefit from skilled PT to increase their independence and safety with mobility to allow discharge to the venue listed below.       Follow Up Recommendations No PT follow up    Equipment Recommendations  None recommended by PT    Recommendations for Other Services       Precautions / Restrictions Precautions Precaution Comments: monitor sats and HR- 130's      Mobility  Bed Mobility Overal bed mobility: Independent                Transfers Overall transfer level: Independent                  Ambulation/Gait Ambulation/Gait assistance: Supervision Gait Distance (Feet): 30 Feet(on RA, with 4/4 dyspnea, SPO2 86%) Assistive device: None Gait Pattern/deviations: Step-through pattern Gait velocity: decr   General Gait Details: then 120' on 2 L  SPO2 88%-95%, then 60' on RA 87%, HR 129-135  Stairs             Wheelchair Mobility    Modified Rankin (Stroke Patients Only)       Balance Overall balance assessment: No apparent balance deficits (not formally assessed)                                           Pertinent Vitals/Pain Pain Assessment: Faces Faces Pain Scale: Hurts little more Pain Location: chest tightness with SOB Pain Descriptors / Indicators: Discomfort Pain Intervention(s): Monitored during session    Home Living Family/patient expects to be discharged to:: Private residence Living Arrangements: Spouse/significant other Available Help at Discharge: Family Type of Home: House Home Access: Stairs to enter   Secretary/administrator of Steps: 3 Home Layout: One level Home Equipment: None      Prior Function Level of Independence: Independent               Hand Dominance   Dominant Hand: Right    Extremity/Trunk Assessment   Upper Extremity Assessment Upper Extremity Assessment: Overall WFL for tasks assessed    Lower Extremity Assessment Lower Extremity Assessment: Overall WFL for tasks assessed    Cervical / Trunk Assessment Cervical / Trunk Assessment: Kyphotic  Communication   Communication: No difficulties  Cognition Arousal/Alertness: Awake/alert Behavior During Therapy: WFL for tasks assessed/performed Overall Cognitive Status: Within Functional Limits for tasks assessed  General Comments      Exercises     Assessment/Plan    PT Assessment Patient needs continued PT services  PT Problem List Decreased activity tolerance;Cardiopulmonary status limiting activity       PT Treatment Interventions Gait training;Functional mobility training;Therapeutic activities;Therapeutic exercise    PT Goals (Current goals can be found in the Care Plan section)  Acute Rehab PT Goals Patient Stated Goal: to get over this virus, breathe, get back to work PT Goal  Formulation: With patient Time For Goal Achievement: 01/18/19 Potential to Achieve Goals: Good    Frequency Min 3X/week   Barriers to discharge        Co-evaluation               AM-PAC PT "6 Clicks" Mobility  Outcome Measure Help needed turning from your back to your side while in a flat bed without using bedrails?: None Help needed moving from lying on your back to sitting on the side of a flat bed without using bedrails?: None Help needed moving to and from a bed to a chair (including a wheelchair)?: None Help needed standing up from a chair using your arms (e.g., wheelchair or bedside chair)?: None Help needed to walk in hospital room?: A Little Help needed climbing 3-5 steps with a railing? : A Little 6 Click Score: 22    End of Session Equipment Utilized During Treatment: Oxygen Activity Tolerance: Patient tolerated treatment well Patient left: in chair;with call bell/phone within reach Nurse Communication: Mobility status PT Visit Diagnosis: Unsteadiness on feet (R26.81);Difficulty in walking, not elsewhere classified (R26.2)    Time: 0981-1914 PT Time Calculation (min) (ACUTE ONLY): 36 min   Charges:   PT Evaluation $PT Eval Moderate Complexity: 1 Mod PT Treatments $Gait Training: 8-22 mins        Tresa Endo PT Acute Rehabilitation Services Pager [phone] Office [phone]    Claretha Cooper 01/04/2019, 1:33 PM

## 2019-01-04 NOTE — Progress Notes (Signed)
PROGRESS NOTE                                                                                                                                                                                                             Patient Demographics:    Bryan Oconnor, is a 68 y.o. male, DOB - [birth year], VVO:[id number]  Outpatient Primary MD for the patient is Patient, No Pcp Per   Admit date - 01/03/2019   LOS - 1  No chief complaint on file.      Brief Narrative: Patient is a 68 y.o. 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   Subjective:    Bryan Oconnor today feels better than yesterday.  He is now on room air.   Assessment  & Plan :   Acute Hypoxic Resp Failure due to post Covid lung inflammation with fibrosis: Improving with IV steroids.  CT chest reviewed with PCCM-recommendations are to continue with steroids, spoke with Dr. Kym Groom will arrange for outpatient follow-up with him.  We will plan to discharge him on steroids till he sees Dr. Marchelle Gearing.  Although he is COVID-19 positive-he does not have active Covid pneumonia at this point.  Fever: afebrile  O2 requirements: On RA  (was on 3L yesterday)  COVID-19 Labs: Recent Labs    01/02/19 1718 01/03/19 1244 01/03/19 1255 01/04/19 0013  DDIMER  --   --  2.52* 1.93*  FERRITIN 678*  --  876* 818*  CRP 5.6* 4.5*  --   --     Lab Results  Component Value Date   SARSCOV2NAA POSITIVE (A) 01/02/2019     COVID-19 Medications: Steroids:10/29>> Remdesivir:10/12>>10/16 Actemra:N/A Convalescent Plasma:N/A Research Studies:N/A  Other medications: Diuretics:Euvolemic- Lasix x1  to maintain negative balance Antibiotics:Not  needed as no evidence of bacterial infection  Prone/Incentive Spirometry: encouraged  incentive spirometry use 3-4/hour.  DVT Prophylaxis  :  Lovenox  HTN: Blood pressure currently controlled-monitor without the use of antihypertensives  DM-2 with uncontrolled hyperglycemia (A1c 11.5 on 01/03/2019):  CBGs remain uncontrolled-increase Lantus to 25 units nightly, increase NovoLog to 10 units with meals, change SSI to resistant scale.  Follow and adjust.  CBG (last 3)  Recent Labs    01/03/19 1647 01/03/19 2114 01/04/19 0737  GLUCAP 322* 335* 344*    CKD stage III: Creatinine close to usual baseline-follow.  ABG: No results found for: PHART, PCO2ART, PO2ART, HCO3, TCO2, ACIDBASEDEF, O2SAT  Vent Settings: N/A  Condition -Stable  Family Communication  :  Spouse updated over the phone  Code Status :  Full Code  Diet :  Diet Order            Diet heart healthy/carb modified Room service appropriate? Yes; Fluid consistency: Thin  Diet effective now               Disposition Plan  :  Remain hospitalized  Barriers to discharge: Hypoxia requiring O2 supplementation  Consults  :  None  Procedures  :  None  Antibiotics  :    Anti-infectives (From admission, onward)   None      Inpatient Medications  Scheduled Meds:  enoxaparin (LOVENOX) injection  40 mg Subcutaneous Q24H   insulin aspart  0-15 Units Subcutaneous TID WC   insulin aspart  0-5 Units Subcutaneous QHS   insulin aspart  4 Units Subcutaneous TID WC   insulin glargine  12 Units Subcutaneous QHS   methylPREDNISolone (SOLU-MEDROL) injection  40 mg Intravenous Q12H   sodium chloride flush  3 mL Intravenous Q12H   Continuous Infusions:  sodium chloride     PRN Meds:.sodium chloride, acetaminophen, albuterol, chlorpheniramine-HYDROcodone, guaiFENesin-dextromethorphan, ondansetron **OR** ondansetron (ZOFRAN) IV, polyethylene glycol, sodium chloride flush   Time Spent in minutes  25  See all  Orders from today for further details   Jeoffrey Massed M.D on 01/04/2019 at 10:27 AM  To page go to [email or web address] - use universal password  [facility] -  Office  [phone]    Objective:   Vitals:   01/03/19 1900 01/04/19 0100 01/04/19 0738 01/04/19 0848  BP: 109/73 123/88 123/83   Pulse:   91   Resp: 20 20 (!) 21   Temp: 98.2 F (36.8 C) 98.3 F (36.8 C) 97.6 F (36.4 C)   TempSrc: Oral Oral Oral   SpO2:   95% 100%  Weight:      Height:        Wt Readings from Last 3 Encounters:  01/03/19 56.7 kg  01/02/19 56.7 kg  12/17/18 70.5 kg     Intake/Output Summary (Last 24 hours) at 01/04/2019 1027 Last data filed at 01/04/2019 0500 Gross per 24 hour  Intake 483 ml  Output 1200 ml  Net -717 ml     Physical Exam Gen Exam:Alert awake-not in any distress HEENT:atraumatic, normocephalic Chest: B/L clear to auscultation anteriorly CVS:S1S2 regular Abdomen:soft non tender, non distended Extremities:no edema Neurology: Non focal Skin: no rash   Data Review:    CBC Recent Labs  Lab 01/02/19 1718 01/03/19 1255 01/04/19 0013  WBC 6.6 7.9 7.2  HGB 12.3* 12.3* 12.1*  HCT 36.6* 37.0* 37.1*  PLT 428* 430* 440*  MCV 83.9 85.1 85.3  MCH 28.2 28.3 27.8  MCHC 33.6 33.2 32.6  RDW 12.3 12.5 12.4  LYMPHSABS 1.1 2.2 1.3  MONOABS 0.2 0.7 0.3  EOSABS 0.0 0.1 0.0  BASOSABS 0.0 0.0 0.0    Chemistries  Recent Labs  Lab 01/02/19 1718 01/02/19 1955 01/03/19 1255 01/04/19 0013  NA 130*  --  134* 134*  K 4.9  --  4.6 5.1  CL 94*  --  98 98  CO2 23  --  27 24  GLUCOSE 420*  --  233* 226*  BUN 31*  --  32* 39*  CREATININE 1.23  --  1.26* 1.33*  CALCIUM 9.3  --  9.1 9.2  MG  --  1.6*  --   --   AST  --   --  9* 11*  ALT  --   --  23 23  ALKPHOS  --   --  61 65  BILITOT  --   --  0.5 0.3   ------------------------------------------------------------------------------------------------------------------ No results for input(s): CHOL, HDL, LDLCALC,  TRIG, CHOLHDL, LDLDIRECT in the last 72 hours.  Lab Results  Component Value Date   HGBA1C 11.5 (H) 01/03/2019   ------------------------------------------------------------------------------------------------------------------ No results for input(s): TSH, T4TOTAL, T3FREE, THYROIDAB in the last 72 hours.  Invalid input(s): FREET3 ------------------------------------------------------------------------------------------------------------------ Recent Labs    01/03/19 1255 01/04/19 0013  FERRITIN 876* 818*    Coagulation profile No results for input(s): INR, PROTIME in the last 168 hours.  Recent Labs    01/03/19 1255 01/04/19 0013  DDIMER 2.52* 1.93*    Cardiac Enzymes No results for input(s): CKMB, TROPONINI, MYOGLOBIN in the last 168 hours.  Invalid input(s): CK ------------------------------------------------------------------------------------------------------------------    Component Value Date/Time   BNP 19.6 01/03/2019 1255    Micro Results Recent Results (from the past 240 hour(s))  SARS Coronavirus 2 by RT PCR (hospital order, performed in [facility] hospital lab) Nasopharyngeal Nasopharyngeal Swab     Status: Abnormal   Collection Time: 01/02/19  7:55 PM   Specimen: Nasopharyngeal Swab  Result Value Ref Range Status   SARS Coronavirus 2 POSITIVE (A) NEGATIVE Final    Comment: RESULT CALLED TO, READ BACK BY AND VERIFIED WITH: GRACIE WINEMAN  01/02/19 MJU (NOTE) If result is NEGATIVE SARS-CoV-2 target nucleic acids are NOT DETECTED. The SARS-CoV-2 RNA is generally detectable in upper and lower  respiratory specimens during the acute phase of infection. The lowest  concentration of SARS-CoV-2 viral copies this assay can detect is 250  copies / mL. A negative result does not preclude SARS-CoV-2 infection  and should not be used as the sole basis for treatment or other  patient management decisions.  A negative result may occur with  improper  specimen collection / handling, submission of specimen other  than nasopharyngeal swab, presence of viral mutation(s) within the  areas targeted by this assay, and inadequate number of viral copies  (<250 copies / mL). A negative result must be combined with clinical  observations, patient history, and epidemiological information. If result is POSITIVE SARS-CoV-2 target nucleic acids are DETECTED. The  SARS-CoV-2 RNA is generally detectable in upper and lower  respiratory specimens during the acute phase of infection.  Positive  results are indicative of active infection with SARS-CoV-2.  Clinical  correlation with patient history and other diagnostic information is  necessary to determine patient infection status.  Positive results do  not rule out bacterial infection or co-infection with other viruses. If result is PRESUMPTIVE POSTIVE SARS-CoV-2 nucleic acids MAY BE PRESENT.   A presumptive positive result was obtained on the submitted specimen  and confirmed on repeat testing.  While 2019 novel coronavirus  (SARS-CoV-2) nucleic acids may be present in the submitted sample  additional confirmatory testing may be necessary for epidemiological  and / or clinical management purposes  to differentiate between  SARS-CoV-2 and other Sarbecovirus currently known to infect humans.  If clinically indicated additional testing with an alternate test  methodology [id number]) is a dvised. The SARS-CoV-2 RNA is  generally  detectable in upper and lower respiratory specimens during the acute  phase of infection. The expected result is Negative. Fact Sheet for Patients:  StrictlyIdeas.no Fact Sheet for Healthcare Providers: BankingDealers.co.za This test is not yet approved or cleared by the [place] FDA and has been authorized for detection and/or diagnosis of SARS-CoV-2 by FDA under an Emergency Use Authorization (EUA).  This EUA will remain in  effect (meaning this test can be used) for the duration of the COVID-19 declaration under Section 564(b)(1) of the Act, 21 U.S.C. section 360bbb-3(b)(1), unless the authorization is terminated or revoked sooner. Performed at [facility], [address] [place], [place] [address]   Respiratory Panel by PCR     Status: None   Collection Time: 01/03/19  1:55 PM   Specimen: Nasopharyngeal Swab; Respiratory  Result Value Ref Range Status   Adenovirus NOT DETECTED NOT DETECTED Final   Coronavirus 229E NOT DETECTED NOT DETECTED Final    Comment: (NOTE) The Coronavirus on the Respiratory Panel, DOES NOT test for the novel  Coronavirus (2019 nCoV)    Coronavirus HKU1 NOT DETECTED NOT DETECTED Final   Coronavirus NL63 NOT DETECTED NOT DETECTED Final   Coronavirus OC43 NOT DETECTED NOT DETECTED Final   Metapneumovirus NOT DETECTED NOT DETECTED Final   Rhinovirus / Enterovirus NOT DETECTED NOT DETECTED Final   Influenza A NOT DETECTED NOT DETECTED Final   Influenza B NOT DETECTED NOT DETECTED Final   Parainfluenza Virus 1 NOT DETECTED NOT DETECTED Final   Parainfluenza Virus 2 NOT DETECTED NOT DETECTED Final   Parainfluenza Virus 3 NOT DETECTED NOT DETECTED Final   Parainfluenza Virus 4 NOT DETECTED NOT DETECTED Final   Respiratory Syncytial Virus NOT DETECTED NOT DETECTED Final   Bordetella pertussis NOT DETECTED NOT DETECTED Final   Chlamydophila pneumoniae NOT DETECTED NOT DETECTED Final   Mycoplasma pneumoniae NOT DETECTED NOT DETECTED Final    Comment: Performed at [facility], [place]. [address] [place], [place] [address]    Radiology Reports Dg Chest 1 View  Result Date: 12/17/2018 CLINICAL DATA:  Fever. EXAM: CHEST  1 VIEW COMPARISON:  December 16, 2018. FINDINGS: The heart size and mediastinal contours are within normal limits. Both lungs are clear. The visualized skeletal structures are unremarkable. IMPRESSION: No active disease. Electronically Signed   By: Marijo Conception M.D.   On: 12/17/2018 08:59   Ct Chest Wo Contrast  Result Date: 01/03/2019 CLINICAL DATA:  Inpatient.  COVID-19 pneumonia. EXAM: CT CHEST WITHOUT CONTRAST TECHNIQUE: Multidetector CT imaging of the chest was performed following the standard protocol without IV contrast. COMPARISON:  Chest radiograph from one day prior. FINDINGS: Motion degraded scan, limiting assessment. Cardiovascular: Normal heart size. No significant pericardial effusion/thickening. Normal course and caliber of the thoracic aorta. Top-normal caliber main pulmonary artery (3.2 cm diameter). Mediastinum/Nodes: No discrete thyroid nodules. Unremarkable esophagus. No axillary adenopathy. Mildly enlarged 1.1 cm subcarinal node (series 2/image 28). No discrete hilar adenopathy on this noncontrast scan. Lungs/Pleura: No pneumothorax. No pleural effusion. Moderate centrilobular and paraseptal emphysema. There is extensive patchy confluent crazy paving opacity throughout both lungs involving all lung lobes with involvement of the peripheral, perilobular and peribronchovascular lungs, without a clear apicobasilar gradient to these findings. No discrete lung masses or significant pulmonary nodules. Suggestion of mild parenchymal distortion and mild traction bronchiectasis throughout both lungs. Upper abdomen: No acute abnormality. Musculoskeletal: No aggressive appearing focal osseous lesions. Mild thoracic spondylosis. IMPRESSION: 1. Limited motion degraded scan. Extensive patchy  confluent crazy paving opacity throughout both lungs with some associated parenchymal distortion and suggestion of mild traction bronchiectasis. Findings are compatible with severe COVID-19 pneumonia with suggestion of superimposed early postinflammatory fibrosis. Consider follow-up high-resolution chest CT study in 3-6 months, as clinically warranted. 2. Mild subcarinal lymphadenopathy, nonspecific, presumably reactive, which can also be reassessed on follow-up  chest CT. Emphysema (ICD10-J43.9). Electronically Signed   By: Delbert PhenixJason A Poff M.D.   On: 01/03/2019 15:54   Dg Chest Portable 1 View  Result Date: 01/02/2019 CLINICAL DATA:  Short of breath EXAM: PORTABLE CHEST 1 VIEW COMPARISON:  12/17/2018 FINDINGS: Diffuse bilateral interstitial and ground-glass opacity with peripheral consolidations on the right. No pleural effusion. Normal heart size. No pneumothorax. IMPRESSION: Development of fairly extensive interstitial and ground-glass opacities, consistent with multifocal pneumonia and provided clinical history of COVID-19 positivity. Electronically Signed   By: Jasmine PangKim  Fujinaga M.D.   On: 01/02/2019 18:05   Dg Chest Portable 1 View  Result Date: 12/16/2018 CLINICAL DATA:  Fever.  COVID-19 positive. EXAM: PORTABLE CHEST 1 VIEW COMPARISON:  None. FINDINGS: Cardiomediastinal silhouette is normal. Mediastinal contours appear intact. There is no evidence of focal airspace consolidation, pleural effusion or pneumothorax. Osseous structures are without acute abnormality. Soft tissues are grossly normal. IMPRESSION: No active disease. Electronically Signed   By: Ted Mcalpineobrinka  Dimitrova M.D.   On: 12/16/2018 16:01

## 2019-01-04 NOTE — Progress Notes (Signed)
Inpatient Diabetes Program Recommendations  AACE/ADA: New Consensus Statement on Inpatient Glycemic Control   Target Ranges:  Prepandial:   less than 140 mg/dL      Peak postprandial:   less than 180 mg/dL (1-2 hours)      Critically ill patients:  140 - 180 mg/dL  Results for Bryan Oconnor, Bryan Oconnor (MRN [id number]) as of 01/04/2019 11:07  Ref. Range 01/03/2019 10:36 01/03/2019 12:43 01/03/2019 16:47 01/03/2019 21:14 01/04/2019 07:37  Glucose-Capillary Latest Ref Range: 70 - 99 mg/dL 348 (H) 204 (H) 322 (H) 335 (H) 344 (H)   Results for Bryan Oconnor, Bryan Oconnor (MRN [id number]) as of 01/04/2019 11:07  Ref. Range 01/03/2019 12:55  Hemoglobin A1C Latest Ref Range: 4.8 - 5.6 % 11.5 (H)    Review of Glycemic Control  Diabetes history: DM2 Outpatient Diabetes medications: Metformin 1000 mg BID, Glipizide 10 mg daily Current orders for Inpatient glycemic control: Lantus 25 units QHS, Novolog 10 units TID with meals, Novolog 0-20 units TID with meals, Novolog 0-5 units QHS; Solumedrol 40 mg Q12H  Inpatient Diabetes Program Recommendations:   HbgA1C: A1C 11.5% on 01/03/19 indicating an average glucose of 283 mg/dl over the past 2-3 months. Patient was inpatient with COVID 12/17/18 to 12/22/18 and was given steroids during hospitalization and discharged on oral Decadron taper. Noted PCP note on 12/10/18 that A1C was 10.4% on 12/10/18 and MD noted "Increasingly elevated, will try to add SGLT2 depending on his insurance coverage. Totally needle averse, discussed using insulin or weekly injections and he is much more interested in pill formulation."  No SGLT2 medications noted on current outpatient medication list.   NOTE:  Noted several insulin changes made today already due to hyperglycemia. Will follow along while inpatient and make further recommendations if needed.  Thanks, Barnie Alderman, RN, MSN, CDE Diabetes Coordinator Inpatient Diabetes Program [phone] (Team Pager from 8am to 5pm)

## 2019-01-04 NOTE — Plan of Care (Signed)
Talked with wife to update on patient's current status and POC.  No further questions at this time.

## 2019-01-05 LAB — COMPREHENSIVE METABOLIC PANEL
ALT: 20 U/L (ref 0–44)
AST: 9 U/L — ABNORMAL LOW (ref 15–41)
Albumin: 2.9 g/dL — ABNORMAL LOW (ref 3.5–5.0)
Alkaline Phosphatase: 61 U/L (ref 38–126)
Anion gap: 12 (ref 5–15)
BUN: 58 mg/dL — ABNORMAL HIGH (ref 8–23)
CO2: 24 mmol/L (ref 22–32)
Calcium: 9.1 mg/dL (ref 8.9–10.3)
Chloride: 99 mmol/L (ref 98–111)
Creatinine, Ser: 1.42 mg/dL — ABNORMAL HIGH (ref 0.61–1.24)
GFR calc Af Amer: 58 mL/min — ABNORMAL LOW (ref >60)
GFR calc non Af Amer: 50 mL/min — ABNORMAL LOW (ref >60)
Glucose, Bld: 147 mg/dL — ABNORMAL HIGH (ref 70–99)
Potassium: 4.5 mmol/L (ref 3.5–5.1)
Sodium: 135 mmol/L (ref 135–145)
Total Bilirubin: 0.7 mg/dL (ref 0.3–1.2)
Total Protein: 7.2 g/dL (ref 6.5–8.1)

## 2019-01-05 LAB — GLUCOSE, CAPILLARY
Glucose-Capillary: 334 mg/dL — ABNORMAL HIGH (ref 70–99)
Glucose-Capillary: 316 mg/dL — ABNORMAL HIGH (ref 70–99)

## 2019-01-05 LAB — CBC WITH DIFFERENTIAL/PLATELET
Abs Immature Granulocytes: 0.09 10*3/uL — ABNORMAL HIGH (ref 0.00–0.07)
Basophils Absolute: 0 10*3/uL (ref 0.0–0.1)
Basophils Relative: 0 %
Eosinophils Absolute: 0.1 10*3/uL (ref 0.0–0.5)
Eosinophils Relative: 1 %
HCT: 38.8 % — ABNORMAL LOW (ref 39.0–52.0)
Hemoglobin: 13 g/dL (ref 13.0–17.0)
Immature Granulocytes: 1 %
Lymphocytes Relative: 28 %
Lymphs Abs: 2 10*3/uL (ref 0.7–4.0)
MCH: 28 pg (ref 26.0–34.0)
MCHC: 33.5 g/dL (ref 30.0–36.0)
MCV: 83.6 fL (ref 80.0–100.0)
Monocytes Absolute: 0.5 10*3/uL (ref 0.1–1.0)
Monocytes Relative: 7 %
Neutro Abs: 4.4 10*3/uL (ref 1.7–7.7)
Neutrophils Relative %: 63 %
Platelets: 448 10*3/uL — ABNORMAL HIGH (ref 150–400)
RBC: 4.64 MIL/uL (ref 4.22–5.81)
RDW: 12.7 % (ref 11.5–15.5)
WBC: 7.1 10*3/uL (ref 4.0–10.5)
nRBC: 0 % (ref 0.0–0.2)

## 2019-01-05 LAB — FERRITIN: Ferritin: 934 ng/mL — ABNORMAL HIGH (ref 24–336)

## 2019-01-05 LAB — D-DIMER, QUANTITATIVE: D-Dimer, Quant: 1.51 ug/mL-FEU — ABNORMAL HIGH (ref 0.00–0.50)

## 2019-01-05 MED ORDER — METHYLPREDNISOLONE SODIUM SUCC 40 MG IJ SOLR
20.0000 mg | Freq: Two times a day (BID) | INTRAMUSCULAR | Status: DC
  Administered 2019-01-05 – 2019-01-06 (×4): 20 mg via INTRAVENOUS
  Filled 2019-01-05 (×4): qty 1

## 2019-01-05 NOTE — Progress Notes (Signed)
PROGRESS NOTE                                                                                                                                                                                                             Patient Demographics:    Bryan Oconnor, is a 68 y.o. male, DOB - 1950-04-25, QBV:[id number]  Outpatient Primary MD for the patient is Patient, No Pcp Per   Admit date - 01/03/2019   LOS - 2  No chief complaint on file.      Brief Narrative: Patient is a 68 y.o. 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   Subjective:    Bryan Oconnor feels better-he is no longer requiring oxygen-claims he is now able to walk around the room with significantly less shortness of breath compared to on admission.   Assessment  & Plan :   Acute Hypoxic Resp Failure due to post Covid lung inflammation with fibrosis: Improving with IV steroids-plan is to encourage him to ambulate today-continue to slowly titrate down steroids.  Spoke with PCCM-Dr. Kym Groom will arrange for outpatient follow-up in his office.  Plan is to keep him on oral steroids till seen by Dr. Marchelle Gearing.   Although he is COVID-19 positive-he does not have active Covid pneumonia at this point.  Fever: afebrile  O2 requirements: On RA   COVID-19 Labs: Recent Labs    01/02/19 1718 01/03/19 1244 01/03/19 1255 01/04/19 0013 01/05/19 0203  DDIMER  --   --  2.52* 1.93* 1.51*  FERRITIN 678*  --  876* 818* 934*  CRP 5.6* 4.5*  --   --   --     Lab Results  Component Value Date   SARSCOV2NAA POSITIVE (A) 01/02/2019     COVID-19 Medications: Steroids:10/29>> Remdesivir:10/12>>10/16 Actemra:N/A Convalescent  Plasma:N/A Research Studies:N/A  Other medications: Diuretics:Euvolemic- Lasix x1  to maintain negative balance Antibiotics:Not needed as no evidence of bacterial infection  Prone/Incentive Spirometry: encouraged  incentive spirometry use 3-4/hour.  DVT Prophylaxis  :  Lovenox  HTN: Blood pressure currently controlled-monitor without the use of antihypertensives  DM-2 with uncontrolled hyperglycemia (A1c 11.5 on 01/03/2019):  CBGs still elevated-but steroid  dosage has been decreased-awaiting CBGs this morning-for now continue with Lantus 25 units nightly, NovoLog 10 units with meals and resistant SSI.  Anticipate sugars improving once steroids are further tapered down.    CBG (last 3)  Recent Labs    01/03/19 2114 01/04/19 0737 01/04/19 1208  GLUCAP 335* 344* 410*    CKD stage III: Creatinine close to usual baseline-follow.  ABG: No results found for: PHART, PCO2ART, PO2ART, HCO3, TCO2, ACIDBASEDEF, O2SAT  Vent Settings: N/A  Condition -Stable  Family Communication  : Voicemail left for spouse.  Code Status :  Full Code  Diet :  Diet Order            Diet heart healthy/carb modified Room service appropriate? Yes; Fluid consistency: Thin  Diet effective now               Disposition Plan  :  Remain hospitalized  Barriers to discharge: Hypoxia requiring O2 supplementation  Consults  :  None  Procedures  :  None  Antibiotics  :    Anti-infectives (From admission, onward)   None      Inpatient Medications  Scheduled Meds:  enoxaparin (LOVENOX) injection  40 mg Subcutaneous Q24H   insulin aspart  0-20 Units Subcutaneous TID WC   insulin aspart  0-5 Units Subcutaneous QHS   insulin aspart  10 Units Subcutaneous TID WC   insulin glargine  25 Units Subcutaneous QHS   methylPREDNISolone (SOLU-MEDROL) injection  20 mg Intravenous Q12H   sodium chloride flush  3 mL Intravenous Q12H   Continuous Infusions:  sodium chloride     PRN  Meds:.sodium chloride, acetaminophen, albuterol, chlorpheniramine-HYDROcodone, guaiFENesin-dextromethorphan, ondansetron **OR** ondansetron (ZOFRAN) IV, polyethylene glycol, sodium chloride flush   Time Spent in minutes  25  See all Orders from today for further details   Jeoffrey Massed M.D on 01/05/2019 at 11:06 AM  To page go to [email or web address] - use universal password  [facility] -  Office  [phone]    Objective:   Vitals:   01/04/19 2200 01/05/19 0000 01/05/19 0428 01/05/19 0838  BP:  118/87 128/81 114/82  Pulse: 96  98 97  Resp:  Temp:  98 F (36.7 C) 98 F (36.7 C) 97.7 F (36.5 C)  TempSrc:  Oral Oral Oral  SpO2:   91% 100%  Weight:      Height:        Wt Readings from Last 3 Encounters:  01/03/19 56.7 kg  01/02/19 56.7 kg  12/17/18 70.5 kg     Intake/Output Summary (Last 24 hours) at 01/05/2019 1106 Last data filed at 01/05/2019 0938 Gross per 24 hour  Intake 366 ml  Output 750 ml  Net -384 ml     Physical Exam Gen Exam:Alert awake-not in any distress HEENT:atraumatic, normocephalic Chest: B/L clear to auscultation anteriorly CVS:S1S2 regular Abdomen:soft non tender, non distended Extremities:no edema Neurology: Non focal Skin: no rash   Data Review:    CBC Recent Labs  Lab 01/02/19 1718 01/03/19 1255 01/04/19 0013 01/05/19 0203  WBC 6.6 7.9 7.2 7.1  HGB 12.3* 12.3* 12.1* 13.0  HCT 36.6* 37.0* 37.1* 38.8*  PLT 428* 430* 440* 448*  MCV 83.9 85.1 85.3 83.6  MCH 28.2 28.3 27.8 28.0  MCHC 33.6 33.2 32.6 33.5  RDW 12.3 12.5 12.4 12.7  LYMPHSABS 1.1 2.2 1.3 2.0  MONOABS 0.2 0.7 0.3 0.5  EOSABS 0.0 0.1 0.0 0.1  BASOSABS 0.0 0.0 0.0 0.0    Chemistries  Recent Labs  Lab 01/02/19 1718 01/02/19 1955 01/03/19 1255 01/04/19 0013 01/05/19 0203  NA 130*  --  134* 134* 135  K 4.9  --  4.6 5.1 4.5  CL 94*  --  98 98 99  CO2 23  --  27 24 24   GLUCOSE 420*  --  233* 226* 147*  BUN 31*  --  32* 39* 58*  CREATININE  1.23  --  1.26* 1.33* 1.42*  CALCIUM 9.3  --  9.1 9.2 9.1  MG  --  1.6*  --   --   --   AST  --   --  9* 11* 9*  ALT  --   --  23 23 20   ALKPHOS  --   --  61 65 61  BILITOT  --   --  0.5 0.3 0.7   ------------------------------------------------------------------------------------------------------------------ No results for input(s): CHOL, HDL, LDLCALC, TRIG, CHOLHDL, LDLDIRECT in the last 72 hours.  Lab Results  Component Value Date   HGBA1C 11.5 (H) 01/03/2019   ------------------------------------------------------------------------------------------------------------------ No results for input(s): TSH, T4TOTAL, T3FREE, THYROIDAB in the last 72 hours.  Invalid input(s): FREET3 ------------------------------------------------------------------------------------------------------------------ Recent Labs    01/04/19 0013 01/05/19 0203  FERRITIN 818* 934*    Coagulation profile No results for input(s): INR, PROTIME in the last 168 hours.  Recent Labs    01/04/19 0013 01/05/19 0203  DDIMER 1.93* 1.51*    Cardiac Enzymes No results for input(s): CKMB, TROPONINI, MYOGLOBIN in the last 168 hours.  Invalid input(s): CK ------------------------------------------------------------------------------------------------------------------    Component Value Date/Time   BNP 19.6 01/03/2019 1255    Micro Results Recent Results (from the past 240 hour(s))  SARS Coronavirus 2 by RT PCR (hospital order, performed in [facility] hospital lab) Nasopharyngeal Nasopharyngeal Swab     Status: Abnormal   Collection Time: 01/02/19  7:55 PM   Specimen: Nasopharyngeal Swab  Result Value Ref Range Status   SARS Coronavirus 2 POSITIVE (A) NEGATIVE Final    Comment: RESULT CALLED TO, READ BACK BY AND VERIFIED WITH: GRACIE WINEMAN @2135  01/02/19 MJU (NOTE) If result is NEGATIVE SARS-CoV-2 target nucleic acids are NOT DETECTED. The SARS-CoV-2 RNA is generally detectable in upper and lower   respiratory specimens during the acute phase of infection. The lowest  concentration of SARS-CoV-2 viral copies this assay can detect is 250  copies / mL. A negative result does not preclude SARS-CoV-2 infection  and should not be used as the sole basis for treatment or other  patient management decisions.  A negative result may occur with  improper specimen collection / handling, submission of specimen other  than nasopharyngeal swab, presence of viral mutation(s) within the  areas targeted by this assay, and inadequate number of viral copies  (<250 copies / mL). A negative result must be combined with clinical  observations, patient history, and epidemiological information. If result is POSITIVE SARS-CoV-2 target nucleic acids are DETECTED. The  SARS-CoV-2 RNA is generally detectable in upper and lower  respiratory specimens during the acute phase of infection.  Positive  results are indicative of active infection with SARS-CoV-2.  Clinical  correlation with patient history and other diagnostic information is  necessary to determine patient infection status.  Positive results do  not rule out bacterial infection or co-infection with other viruses. If result is PRESUMPTIVE POSTIVE SARS-CoV-2 nucleic acids MAY BE PRESENT.   A presumptive positive result was obtained on the submitted specimen  and confirmed on repeat testing.  While 2019 novel  coronavirus  (SARS-CoV-2) nucleic acids may be present in the submitted sample  additional confirmatory testing may be necessary for epidemiological  and / or clinical management purposes  to differentiate between  SARS-CoV-2 and other Sarbecovirus currently known to infect humans.  If clinically indicated additional testing with an alternate test  methodology [phone]) is a dvised. The SARS-CoV-2 RNA is generally  detectable in upper and lower respiratory specimens during the acute  phase of infection. The expected result is Negative. Fact  Sheet for Patients:  BoilerBrush.com.cy Fact Sheet for Healthcare Providers: https://pope.com/ This test is not yet approved or cleared by the [place] FDA and has been authorized for detection and/or diagnosis of SARS-CoV-2 by FDA under an Emergency Use Authorization (EUA).  This EUA will remain in effect (meaning this test can be used) for the duration of the COVID-19 declaration under Section 564(b)(1) of the Act, 21 U.S.C. section 360bbb-3(b)(1), unless the authorization is terminated or revoked sooner. Performed at [facility], [address] [place], [place] [address]   Respiratory Panel by PCR     Status: None   Collection Time: 01/03/19  1:55 PM   Specimen: Nasopharyngeal Swab; Respiratory  Result Value Ref Range Status   Adenovirus NOT DETECTED NOT DETECTED Final   Coronavirus 229E NOT DETECTED NOT DETECTED Final    Comment: (NOTE) The Coronavirus on the Respiratory Panel, DOES NOT test for the novel  Coronavirus (2019 nCoV)    Coronavirus HKU1 NOT DETECTED NOT DETECTED Final   Coronavirus NL63 NOT DETECTED NOT DETECTED Final   Coronavirus OC43 NOT DETECTED NOT DETECTED Final   Metapneumovirus NOT DETECTED NOT DETECTED Final   Rhinovirus / Enterovirus NOT DETECTED NOT DETECTED Final   Influenza A NOT DETECTED NOT DETECTED Final   Influenza B NOT DETECTED NOT DETECTED Final   Parainfluenza Virus 1 NOT DETECTED NOT DETECTED Final   Parainfluenza Virus 2 NOT DETECTED NOT DETECTED Final   Parainfluenza Virus 3 NOT DETECTED NOT DETECTED Final   Parainfluenza Virus 4 NOT DETECTED NOT DETECTED Final   Respiratory Syncytial Virus NOT DETECTED NOT DETECTED Final   Bordetella pertussis NOT DETECTED NOT DETECTED Final   Chlamydophila pneumoniae NOT DETECTED NOT DETECTED Final   Mycoplasma pneumoniae NOT DETECTED NOT DETECTED Final    Comment: Performed at [facility], [address], [place], [place] [address]      Radiology Reports Dg Chest 1 View  Result Date: 12/17/2018 CLINICAL DATA:  Fever. EXAM: CHEST  1 VIEW COMPARISON:  December 16, 2018. FINDINGS: The heart size and mediastinal contours are within normal limits. Both lungs are clear. The visualized skeletal structures are unremarkable. IMPRESSION: No active disease. Electronically Signed   By: Lupita Raider M.D.   On: 12/17/2018 08:59   Ct Chest Wo Contrast  Result Date: 01/03/2019 CLINICAL DATA:  Inpatient.  COVID-19 pneumonia. EXAM: CT CHEST WITHOUT CONTRAST TECHNIQUE: Multidetector CT imaging of the chest was performed following the standard protocol without IV contrast. COMPARISON:  Chest radiograph from one day prior. FINDINGS: Motion degraded scan, limiting assessment. Cardiovascular: Normal heart size. No significant pericardial effusion/thickening. Normal course and caliber of the thoracic aorta. Top-normal caliber main pulmonary artery (3.2 cm diameter). Mediastinum/Nodes: No discrete thyroid nodules. Unremarkable esophagus. No axillary adenopathy. Mildly enlarged 1.1 cm subcarinal node (series 2/image 28). No discrete hilar adenopathy on this noncontrast scan. Lungs/Pleura: No pneumothorax. No pleural effusion. Moderate centrilobular and paraseptal emphysema. There is extensive patchy confluent crazy paving opacity throughout both lungs involving  all lung lobes with involvement of the peripheral, perilobular and peribronchovascular lungs, without a clear apicobasilar gradient to these findings. No discrete lung masses or significant pulmonary nodules. Suggestion of mild parenchymal distortion and mild traction bronchiectasis throughout both lungs. Upper abdomen: No acute abnormality. Musculoskeletal: No aggressive appearing focal osseous lesions. Mild thoracic spondylosis. IMPRESSION: 1. Limited motion degraded scan. Extensive patchy confluent crazy paving opacity throughout both lungs with some associated parenchymal distortion and  suggestion of mild traction bronchiectasis. Findings are compatible with severe COVID-19 pneumonia with suggestion of superimposed early postinflammatory fibrosis. Consider follow-up high-resolution chest CT study in 3-6 months, as clinically warranted. 2. Mild subcarinal lymphadenopathy, nonspecific, presumably reactive, which can also be reassessed on follow-up chest CT. Emphysema (ICD10-J43.9). Electronically Signed   By: Delbert Phenix M.D.   On: 01/03/2019 15:54   Dg Chest Portable 1 View  Result Date: 01/02/2019 CLINICAL DATA:  Short of breath EXAM: PORTABLE CHEST 1 VIEW COMPARISON:  12/17/2018 FINDINGS: Diffuse bilateral interstitial and ground-glass opacity with peripheral consolidations on the right. No pleural effusion. Normal heart size. No pneumothorax. IMPRESSION: Development of fairly extensive interstitial and ground-glass opacities, consistent with multifocal pneumonia and provided clinical history of COVID-19 positivity. Electronically Signed   By: Jasmine Pang M.D.   On: 01/02/2019 18:05   Dg Chest Portable 1 View  Result Date: 12/16/2018 CLINICAL DATA:  Fever.  COVID-19 positive. EXAM: PORTABLE CHEST 1 VIEW COMPARISON:  None. FINDINGS: Cardiomediastinal silhouette is normal. Mediastinal contours appear intact. There is no evidence of focal airspace consolidation, pleural effusion or pneumothorax. Osseous structures are without acute abnormality. Soft tissues are grossly normal. IMPRESSION: No active disease. Electronically Signed   By: Ted Mcalpine M.D.   On: 12/16/2018 16:01   Vas [place] Lower Extremity Venous (dvt)  Result Date: 01/04/2019  Lower Venous Study Indications: Covid with elevated d-dimer.  Comparison Study: No prior. Performing Technologist: Marilynne Halsted RDMS, RVT  Examination Guidelines: A complete evaluation includes B-mode imaging, spectral Doppler, color Doppler, and power Doppler as needed of all accessible portions of each vessel. Bilateral testing is  considered an integral part of a complete examination. Limited examinations for reoccurring indications may be performed as noted.  +---------+---------------+---------+-----------+----------+--------------+  RIGHT     Compressibility Phasicity Spontaneity Properties Thrombus Aging  +---------+---------------+---------+-----------+----------+--------------+  CFV       Full            Yes       Yes                                    +---------+---------------+---------+-----------+----------+--------------+  SFJ       Full                                                             +---------+---------------+---------+-----------+----------+--------------+  FV Prox   Full                                                             +---------+---------------+---------+-----------+----------+--------------+  FV Mid    Full                                                             +---------+---------------+---------+-----------+----------+--------------+  FV Distal Full                                                             +---------+---------------+---------+-----------+----------+--------------+  PFV       Full                                                             +---------+---------------+---------+-----------+----------+--------------+  POP       Full            Yes       Yes                                    +---------+---------------+---------+-----------+----------+--------------+  PTV       Full                                                             +---------+---------------+---------+-----------+----------+--------------+  PERO      Full                                                             +---------+---------------+---------+-----------+----------+--------------+   +---------+---------------+---------+-----------+----------+--------------+  LEFT      Compressibility Phasicity Spontaneity Properties Thrombus Aging   +---------+---------------+---------+-----------+----------+--------------+  CFV       Full            Yes       Yes                                    +---------+---------------+---------+-----------+----------+--------------+  SFJ       Full                                                             +---------+---------------+---------+-----------+----------+--------------+  FV Prox   Full                                                             +---------+---------------+---------+-----------+----------+--------------+  FV Mid    Full                                                             +---------+---------------+---------+-----------+----------+--------------+  FV Distal Full                                                             +---------+---------------+---------+-----------+----------+--------------+  PFV       Full                                                             +---------+---------------+---------+-----------+----------+--------------+  POP       Full            Yes       Yes                                    +---------+---------------+---------+-----------+----------+--------------+  PTV       Full                                                             +---------+---------------+---------+-----------+----------+--------------+  PERO      Full                                                             +---------+---------------+---------+-----------+----------+--------------+     Summary: Right: There is no evidence of deep vein thrombosis in the lower extremity. No cystic structure found in the popliteal fossa. Left: There is no evidence of deep vein thrombosis in the lower extremity. No cystic structure found in the popliteal fossa.  *See table(s) above for measurements and observations. Electronically signed by Waverly Ferrarihristopher Dickson MD on 01/04/2019 at 2:19:24 PM.    Final

## 2019-01-06 LAB — COMPREHENSIVE METABOLIC PANEL
ALT: 17 U/L (ref 0–44)
AST: 9 U/L — ABNORMAL LOW (ref 15–41)
Albumin: 2.8 g/dL — ABNORMAL LOW (ref 3.5–5.0)
Alkaline Phosphatase: 57 U/L (ref 38–126)
Anion gap: 10 (ref 5–15)
BUN: 58 mg/dL — ABNORMAL HIGH (ref 8–23)
CO2: 24 mmol/L (ref 22–32)
Calcium: 8.9 mg/dL (ref 8.9–10.3)
Chloride: 101 mmol/L (ref 98–111)
Creatinine, Ser: 1.42 mg/dL — ABNORMAL HIGH (ref 0.61–1.24)
GFR calc Af Amer: 58 mL/min — ABNORMAL LOW (ref >60)
GFR calc non Af Amer: 50 mL/min — ABNORMAL LOW (ref >60)
Glucose, Bld: 139 mg/dL — ABNORMAL HIGH (ref 70–99)
Potassium: 4.8 mmol/L (ref 3.5–5.1)
Sodium: 135 mmol/L (ref 135–145)
Total Bilirubin: 0.2 mg/dL — ABNORMAL LOW (ref 0.3–1.2)
Total Protein: 6.9 g/dL (ref 6.5–8.1)

## 2019-01-06 LAB — CBC WITH DIFFERENTIAL/PLATELET
Abs Immature Granulocytes: 0.1 10*3/uL — ABNORMAL HIGH (ref 0.00–0.07)
Basophils Absolute: 0 10*3/uL (ref 0.0–0.1)
Basophils Relative: 0 %
Eosinophils Absolute: 0.1 10*3/uL (ref 0.0–0.5)
Eosinophils Relative: 1 %
HCT: 38.7 % — ABNORMAL LOW (ref 39.0–52.0)
Hemoglobin: 12.5 g/dL — ABNORMAL LOW (ref 13.0–17.0)
Immature Granulocytes: 1 %
Lymphocytes Relative: 22 %
Lymphs Abs: 1.5 10*3/uL (ref 0.7–4.0)
MCH: 27.6 pg (ref 26.0–34.0)
MCHC: 32.3 g/dL (ref 30.0–36.0)
MCV: 85.4 fL (ref 80.0–100.0)
Monocytes Absolute: 0.4 10*3/uL (ref 0.1–1.0)
Monocytes Relative: 6 %
Neutro Abs: 4.8 10*3/uL (ref 1.7–7.7)
Neutrophils Relative %: 70 %
Platelets: 425 10*3/uL — ABNORMAL HIGH (ref 150–400)
RBC: 4.53 MIL/uL (ref 4.22–5.81)
RDW: 12.8 % (ref 11.5–15.5)
WBC: 6.9 10*3/uL (ref 4.0–10.5)
nRBC: 0 % (ref 0.0–0.2)

## 2019-01-06 LAB — GLUCOSE, CAPILLARY
Glucose-Capillary: 184 mg/dL — ABNORMAL HIGH (ref 70–99)
Glucose-Capillary: 157 mg/dL — ABNORMAL HIGH (ref 70–99)
Glucose-Capillary: 471 mg/dL — ABNORMAL HIGH (ref 70–99)

## 2019-01-06 LAB — D-DIMER, QUANTITATIVE: D-Dimer, Quant: 1.5 ug/mL-FEU — ABNORMAL HIGH (ref 0.00–0.50)

## 2019-01-06 LAB — FERRITIN: Ferritin: 856 ng/mL — ABNORMAL HIGH (ref 24–336)

## 2019-01-06 MED ORDER — GABAPENTIN 300 MG PO CAPS
600.0000 mg | ORAL_CAPSULE | Freq: Every day | ORAL | Status: DC
  Administered 2019-01-06: 600 mg via ORAL
  Filled 2019-01-06: qty 2

## 2019-01-06 MED ORDER — INSULIN ASPART 100 UNIT/ML ~~LOC~~ SOLN
20.0000 [IU] | Freq: Once | SUBCUTANEOUS | Status: AC
  Administered 2019-01-06: 22:00:00 20 [IU] via SUBCUTANEOUS

## 2019-01-06 MED ORDER — GLIPIZIDE ER 10 MG PO TB24
10.0000 mg | ORAL_TABLET | Freq: Every day | ORAL | Status: DC
  Administered 2019-01-06 – 2019-01-07 (×2): 10 mg via ORAL
  Filled 2019-01-06 (×3): qty 1

## 2019-01-06 MED ORDER — ATORVASTATIN CALCIUM 40 MG PO TABS
40.0000 mg | ORAL_TABLET | Freq: Every day | ORAL | Status: DC
  Administered 2019-01-06 – 2019-01-07 (×2): 40 mg via ORAL
  Filled 2019-01-06 (×2): qty 1

## 2019-01-06 MED ORDER — FINASTERIDE 5 MG PO TABS
5.0000 mg | ORAL_TABLET | Freq: Every day | ORAL | Status: DC
  Administered 2019-01-06 – 2019-01-07 (×2): 5 mg via ORAL
  Filled 2019-01-06 (×3): qty 1

## 2019-01-06 MED ORDER — ASPIRIN EC 81 MG PO TBEC
81.0000 mg | DELAYED_RELEASE_TABLET | Freq: Every day | ORAL | Status: DC
  Administered 2019-01-06 – 2019-01-07 (×2): 81 mg via ORAL
  Filled 2019-01-06 (×2): qty 1

## 2019-01-06 NOTE — Progress Notes (Signed)
PROGRESS NOTE                                                                                                                                                                                                             Patient Demographics:    Bryan Oconnor, is a 68 y.o. male, DOB - [birth year], BJS:[id number]  Outpatient Primary MD for the patient is Patient, No Pcp Per   Admit date - 01/03/2019   LOS - 3  No chief complaint on file.      Brief Narrative: Patient is a 68 y.o. 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   Subjective:    Bryan Oconnor feels better-but still SOB with exertion-requires oxygen supplementation.   Assessment  & Plan :   Acute Hypoxic Resp Failure due to post Covid lung inflammation with fibrosis: Remains stable-still gets short of breath with exertion-continue IV steroids-continue ambulation with PT/nursing staff.  We will plan on transitioning to oral steroids in the next few days. Spoke with PCCM-Dr. Kym Groom will arrange for outpatient follow-up in his office.  Plan is to keep him on oral steroids till seen by Dr. Marchelle Gearing.   Although he is COVID-19 positive-he does not have active Covid pneumonia at this point.  Fever: afebrile  O2 requirements: On RA   COVID-19 Labs: Recent Labs    01/03/19 1244  01/04/19 0013 01/05/19 0203 01/06/19 0116  DDIMER  --    < > 1.93* 1.51* 1.50*  FERRITIN  --    < > 818* 934* 856*  CRP 4.5*  --   --   --   --    < > = values in this interval not displayed.    Lab Results  Component Value Date   SARSCOV2NAA POSITIVE (A) 01/02/2019     COVID-19  Medications: Steroids:10/29>> Remdesivir:10/12>>10/16 Actemra:N/A Convalescent Plasma:N/A Research Studies:N/A  Other medications: Diuretics:Euvolemic-hold Lasix today Antibiotics:Not needed as no evidence of bacterial infection  Prone/Incentive Spirometry: encouraged  incentive spirometry use 3-4/hour.  DVT Prophylaxis  :  Lovenox  HTN: Blood pressure currently controlled-monitor without the use of antihypertensives  DM-2 with uncontrolled hyperglycemia (A1c 11.5 on 01/03/2019):  CBGs better controlled this morning-continue Lantus 25 units nightly, 10 units of NovoLog with meals and SSI.  Suspect insulin requirements are slowly going to come down as steroids are being tapered down.  CBG (last 3)  Recent Labs    01/05/19 0926 01/05/19 2022 01/06/19 0726  GLUCAP 334* 316* 184*    CKD stage III: Creatinine close to usual baseline-follow.  ABG: No results found for: PHART, PCO2ART, PO2ART, HCO3, TCO2, ACIDBASEDEF, O2SAT  Vent Settings: N/A  Condition -Stable  Family Communication  : Spoke with spouse on 10/31-we will talk to family later.  Code Status :  Full Code  Diet :  Diet Order            Diet heart healthy/carb modified Room service appropriate? Yes; Fluid consistency: Thin  Diet effective now               Disposition Plan  :  Remain hospitalized  Barriers to discharge: Hypoxia requiring O2 supplementation  Consults  :  None  Procedures  :  None  Antibiotics  :    Anti-infectives (From admission, onward)   None      Inpatient Medications  Scheduled Meds:  aspirin EC  81 mg Oral Daily   atorvastatin  40 mg Oral Daily   enoxaparin (LOVENOX) injection  40 mg Subcutaneous Q24H   finasteride  5 mg Oral Daily   gabapentin  600 mg Oral QHS   glipiZIDE  10 mg Oral Daily   insulin aspart  0-20 Units Subcutaneous TID WC   insulin aspart  0-5 Units Subcutaneous QHS   insulin aspart  10 Units Subcutaneous TID WC   insulin glargine  25  Units Subcutaneous QHS   methylPREDNISolone (SOLU-MEDROL) injection  20 mg Intravenous Q12H   sodium chloride flush  3 mL Intravenous Q12H   Continuous Infusions:  sodium chloride     PRN Meds:.sodium chloride, acetaminophen, albuterol, chlorpheniramine-HYDROcodone, guaiFENesin-dextromethorphan, ondansetron **OR** ondansetron (ZOFRAN) IV, polyethylene glycol, sodium chloride flush   Time Spent in minutes  25  See all Orders from today for further details   Jeoffrey Massed M.D on 01/06/2019 at 11:16 AM  To page go to [email or web address] - use universal password  [facility] -  Office  [phone]    Objective:   Vitals:   01/06/19 0832 01/06/19 0900 01/06/19 0915 01/06/19 0920  BP:      Pulse: 99 95 99 (!) 102  Resp:      Temp:      TempSrc:      SpO2: (!) 87% 90% (!) 88% 90%  Weight:      Height:        Wt Readings from Last 3 Encounters:  01/03/19 56.7 kg  01/02/19 56.7 kg  12/17/18 70.5 kg     Intake/Output Summary (Last 24 hours) at 01/06/2019 1116 Last data filed at 01/06/2019 0500 Gross per 24 hour  Intake 960 ml  Output 1200 ml  Net -240 ml     Physical Exam Gen Exam:Alert awake-not in any distress HEENT:atraumatic, normocephalic Chest: B/L clear to auscultation anteriorly CVS:S1S2 regular Abdomen:soft non tender, non distended Extremities:no edema Neurology: Non focal Skin: no rash   Data Review:    CBC Recent Labs  Lab 01/02/19 1718 01/03/19 1255 01/04/19 0013 01/05/19 0203 01/06/19 0116  WBC 6.6 7.9 7.2 7.1 6.9  HGB 12.3* 12.3* 12.1* 13.0 12.5*  HCT 36.6* 37.0* 37.1* 38.8* 38.7*  PLT 428* 430* 440* 448* 425*  MCV 83.9 85.1 85.3 83.6  85.4  MCH 28.2 28.3 27.8 28.0 27.6  MCHC 33.6 33.2 32.6 33.5 32.3  RDW 12.3 12.5 12.4 12.7 12.8  LYMPHSABS 1.1 2.2 1.3 2.0 1.5  MONOABS 0.2 0.7 0.3 0.5 0.4  EOSABS 0.0 0.1 0.0 0.1 0.1  BASOSABS 0.0 0.0 0.0 0.0 0.0    Chemistries  Recent Labs  Lab 01/02/19 1718 01/02/19 1955 01/03/19 1255  01/04/19 0013 01/05/19 0203 01/06/19 0116  NA 130*  --  134* 134* 135 135  K 4.9  --  4.6 5.1 4.5 4.8  CL 94*  --  98 98 99 101  CO2 23  --  27 24 24 24   GLUCOSE 420*  --  233* 226* 147* 139*  BUN 31*  --  32* 39* 58* 58*  CREATININE 1.23  --  1.26* 1.33* 1.42* 1.42*  CALCIUM 9.3  --  9.1 9.2 9.1 8.9  MG  --  1.6*  --   --   --   --   AST  --   --  9* 11* 9* 9*  ALT  --   --  23 23 20 17   ALKPHOS  --   --  61 65 61 57  BILITOT  --   --  0.5 0.3 0.7 0.2*   ------------------------------------------------------------------------------------------------------------------ No results for input(s): CHOL, HDL, LDLCALC, TRIG, CHOLHDL, LDLDIRECT in the last 72 hours.  Lab Results  Component Value Date   HGBA1C 11.5 (H) 01/03/2019   ------------------------------------------------------------------------------------------------------------------ No results for input(s): TSH, T4TOTAL, T3FREE, THYROIDAB in the last 72 hours.  Invalid input(s): FREET3 ------------------------------------------------------------------------------------------------------------------ Recent Labs    01/05/19 0203 01/06/19 0116  FERRITIN 934* 856*    Coagulation profile No results for input(s): INR, PROTIME in the last 168 hours.  Recent Labs    01/05/19 0203 01/06/19 0116  DDIMER 1.51* 1.50*    Cardiac Enzymes No results for input(s): CKMB, TROPONINI, MYOGLOBIN in the last 168 hours.  Invalid input(s): CK ------------------------------------------------------------------------------------------------------------------    Component Value Date/Time   BNP 19.6 01/03/2019 1255    Micro Results Recent Results (from the past 240 hour(s))  SARS Coronavirus 2 by RT PCR (hospital order[facility] Nasopharyngeal Nasopharyngeal Swab     Status: Abnormal   Collection Time: 01/02/19  7:55 PM   Specimen: Nasopharyngeal Swab  Result Value Ref Range Status   SARS Coronavirus  2 POSITIVE (A) NEGATIVE Final    Comment: RESULT CALLED TO, READ BACK BY AND VERIFIED WITH: GRACIE WINEMAN @2135  01/02/19 MJU (NOTE) If result is NEGATIVE SARS-CoV-2 target nucleic acids are NOT DETECTED. The SARS-CoV-2 RNA is generally detectable in upper and lower  respiratory specimens during the acute phase of infection. The lowest  concentration of SARS-CoV-2 viral copies this assay can detect is 250  copies / mL. A negative result does not preclude SARS-CoV-2 infection  and should not be used as the sole basis for treatment or other  patient management decisions.  A negative result may occur with  improper specimen collection / handling, submission of specimen other  than nasopharyngeal swab, presence of viral mutation(s) within the  areas targeted by this assay, and inadequate number of viral copies  (<250 copies / mL). A negative result must be combined with clinical  observations, patient history, and epidemiological information. If result is POSITIVE SARS-CoV-2 target nucleic acids are DETECTED. The  SARS-CoV-2 RNA is generally detectable in upper and lower  respiratory specimens during the acute phase of infection.  Positive  results are indicative  of active infection with SARS-CoV-2.  Clinical  correlation with patient history and other diagnostic information is  necessary to determine patient infection status.  Positive results do  not rule out bacterial infection or co-infection with other viruses. If result is PRESUMPTIVE POSTIVE SARS-CoV-2 nucleic acids MAY BE PRESENT.   A presumptive positive result was obtained on the submitted specimen  and confirmed on repeat testing.  While 2019 novel coronavirus  (SARS-CoV-2) nucleic acids may be present in the submitted sample  additional confirmatory testing may be necessary for epidemiological  and / or clinical management purposes  to differentiate between  SARS-CoV-2 and other Sarbecovirus currently known to infect humans.   If clinically indicated additional testing with an alternate test  methodology [phone]) is a dvised. The SARS-CoV-2 RNA is generally  detectable in upper and lower respiratory specimens during the acute  phase of infection. The expected result is Negative. Fact Sheet for Patients:  StrictlyIdeas.no Fact Sheet for Healthcare Providers: BankingDealers.co.za This test is not yet approved or cleared by the [place] FDA and has been authorized for detection and/or diagnosis of SARS-CoV-2 by FDA under an Emergency Use Authorization (EUA).  This EUA will remain in effect (meaning this test can be used) for the duration of the COVID-19 declaration under Section 564(b)(1) of the Act, 21 U.S.C. section 360bbb-3(b)(1), unless the authorization is terminated or revoked sooner. Performed at [facility], [address] [place], [place] [address]   Respiratory Panel by PCR     Status: None   Collection Time: 01/03/19  1:55 PM   Specimen: Nasopharyngeal Swab; Respiratory  Result Value Ref Range Status   Adenovirus NOT DETECTED NOT DETECTED Final   Coronavirus 229E NOT DETECTED NOT DETECTED Final    Comment: (NOTE) The Coronavirus on the Respiratory Panel, DOES NOT test for the novel  Coronavirus (2019 nCoV)    Coronavirus HKU1 NOT DETECTED NOT DETECTED Final   Coronavirus NL63 NOT DETECTED NOT DETECTED Final   Coronavirus OC43 NOT DETECTED NOT DETECTED Final   Metapneumovirus NOT DETECTED NOT DETECTED Final   Rhinovirus / Enterovirus NOT DETECTED NOT DETECTED Final   Influenza A NOT DETECTED NOT DETECTED Final   Influenza B NOT DETECTED NOT DETECTED Final   Parainfluenza Virus 1 NOT DETECTED NOT DETECTED Final   Parainfluenza Virus 2 NOT DETECTED NOT DETECTED Final   Parainfluenza Virus 3 NOT DETECTED NOT DETECTED Final   Parainfluenza Virus 4 NOT DETECTED NOT DETECTED Final   Respiratory Syncytial Virus NOT DETECTED NOT  DETECTED Final   Bordetella pertussis NOT DETECTED NOT DETECTED Final   Chlamydophila pneumoniae NOT DETECTED NOT DETECTED Final   Mycoplasma pneumoniae NOT DETECTED NOT DETECTED Final    Comment: Performed at [facility], [place]. [address] [place], [place] [address]    Radiology Reports Dg Chest 1 View  Result Date: 12/17/2018 CLINICAL DATA:  Fever. EXAM: CHEST  1 VIEW COMPARISON:  December 16, 2018. FINDINGS: The heart size and mediastinal contours are within normal limits. Both lungs are clear. The visualized skeletal structures are unremarkable. IMPRESSION: No active disease. Electronically Signed   By: Marijo Conception M.D.   On: 12/17/2018 08:59   Ct Chest Wo Contrast  Result Date: 01/03/2019 CLINICAL DATA:  Inpatient.  COVID-19 pneumonia. EXAM: CT CHEST WITHOUT CONTRAST TECHNIQUE: Multidetector CT imaging of the chest was performed following the standard protocol without IV contrast. COMPARISON:  Chest radiograph from one day prior. FINDINGS: Motion degraded scan, limiting assessment. Cardiovascular: Normal heart  size. No significant pericardial effusion/thickening. Normal course and caliber of the thoracic aorta. Top-normal caliber main pulmonary artery (3.2 cm diameter). Mediastinum/Nodes: No discrete thyroid nodules. Unremarkable esophagus. No axillary adenopathy. Mildly enlarged 1.1 cm subcarinal node (series 2/image 28). No discrete hilar adenopathy on this noncontrast scan. Lungs/Pleura: No pneumothorax. No pleural effusion. Moderate centrilobular and paraseptal emphysema. There is extensive patchy confluent crazy paving opacity throughout both lungs involving all lung lobes with involvement of the peripheral, perilobular and peribronchovascular lungs, without a clear apicobasilar gradient to these findings. No discrete lung masses or significant pulmonary nodules. Suggestion of mild parenchymal distortion and mild traction bronchiectasis throughout both lungs. Upper abdomen: No acute  abnormality. Musculoskeletal: No aggressive appearing focal osseous lesions. Mild thoracic spondylosis. IMPRESSION: 1. Limited motion degraded scan. Extensive patchy confluent crazy paving opacity throughout both lungs with some associated parenchymal distortion and suggestion of mild traction bronchiectasis. Findings are compatible with severe COVID-19 pneumonia with suggestion of superimposed early postinflammatory fibrosis. Consider follow-up high-resolution chest CT study in 3-6 months, as clinically warranted. 2. Mild subcarinal lymphadenopathy, nonspecific, presumably reactive, which can also be reassessed on follow-up chest CT. Emphysema (ICD10-J43.9). Electronically Signed   By: Delbert Phenix M.D.   On: 01/03/2019 15:54   Dg Chest Portable 1 View  Result Date: 01/02/2019 CLINICAL DATA:  Short of breath EXAM: PORTABLE CHEST 1 VIEW COMPARISON:  12/17/2018 FINDINGS: Diffuse bilateral interstitial and ground-glass opacity with peripheral consolidations on the right. No pleural effusion. Normal heart size. No pneumothorax. IMPRESSION: Development of fairly extensive interstitial and ground-glass opacities, consistent with multifocal pneumonia and provided clinical history of COVID-19 positivity. Electronically Signed   By: Jasmine Pang M.D.   On: 01/02/2019 18:05   Dg Chest Portable 1 View  Result Date: 12/16/2018 CLINICAL DATA:  Fever.  COVID-19 positive. EXAM: PORTABLE CHEST 1 VIEW COMPARISON:  None. FINDINGS: Cardiomediastinal silhouette is normal. Mediastinal contours appear intact. There is no evidence of focal airspace consolidation, pleural effusion or pneumothorax. Osseous structures are without acute abnormality. Soft tissues are grossly normal. IMPRESSION: No active disease. Electronically Signed   By: Ted Mcalpine M.D.   On: 12/16/2018 16:01   Vas [place] Lower Extremity Venous (dvt)  Result Date: 01/04/2019  Lower Venous Study Indications: Covid with elevated d-dimer.  Comparison  Study: No prior. Performing Technologist: Marilynne Halsted RDMS, RVT  Examination Guidelines: A complete evaluation includes B-mode imaging, spectral Doppler, color Doppler, and power Doppler as needed of all accessible portions of each vessel. Bilateral testing is considered an integral part of a complete examination. Limited examinations for reoccurring indications may be performed as noted.  +---------+---------------+---------+-----------+----------+--------------+  RIGHT     Compressibility Phasicity Spontaneity Properties Thrombus Aging  +---------+---------------+---------+-----------+----------+--------------+  CFV       Full            Yes       Yes                                    +---------+---------------+---------+-----------+----------+--------------+  SFJ       Full                                                             +---------+---------------+---------+-----------+----------+--------------+  FV Prox  Full                                                             +---------+---------------+---------+-----------+----------+--------------+  FV Mid    Full                                                             +---------+---------------+---------+-----------+----------+--------------+  FV Distal Full                                                             +---------+---------------+---------+-----------+----------+--------------+  PFV       Full                                                             +---------+---------------+---------+-----------+----------+--------------+  POP       Full            Yes       Yes                                    +---------+---------------+---------+-----------+----------+--------------+  PTV       Full                                                             +---------+---------------+---------+-----------+----------+--------------+  PERO      Full                                                              +---------+---------------+---------+-----------+----------+--------------+   +---------+---------------+---------+-----------+----------+--------------+  LEFT      Compressibility Phasicity Spontaneity Properties Thrombus Aging  +---------+---------------+---------+-----------+----------+--------------+  CFV       Full            Yes       Yes                                    +---------+---------------+---------+-----------+----------+--------------+  SFJ       Full                                                             +---------+---------------+---------+-----------+----------+--------------+  FV Prox   Full                                                             +---------+---------------+---------+-----------+----------+--------------+  FV Mid    Full                                                             +---------+---------------+---------+-----------+----------+--------------+  FV Distal Full                                                             +---------+---------------+---------+-----------+----------+--------------+  PFV       Full                                                             +---------+---------------+---------+-----------+----------+--------------+  POP       Full            Yes       Yes                                    +---------+---------------+---------+-----------+----------+--------------+  PTV       Full                                                             +---------+---------------+---------+-----------+----------+--------------+  PERO      Full                                                             +---------+---------------+---------+-----------+----------+--------------+     Summary: Right: There is no evidence of deep vein thrombosis in the lower extremity. No cystic structure found in the popliteal fossa. Left: There is no evidence of deep vein thrombosis in the lower extremity. No cystic structure found in the popliteal fossa.  *See  table(s) above for measurements and observations. Electronically signed by Waverly Ferrari MD on 01/04/2019 at 2:19:24 PM.    Final

## 2019-01-06 NOTE — Plan of Care (Signed)
Spoke with Wife about current POC and patient status.  Understands patient may need to stay here for few more days and even possibly go to rehab, before coming home - We shall see?? No further questions for now.

## 2019-01-06 NOTE — Discharge Planning (Addendum)
Patient SPO2 maintaining 90% or hiher on 2LNC at rest Ambulated in hall on [facility], SPO2 fell to mid 80s. Ambulated in hall on 4LNC and maintained low SPO2 low 90s. If Discharged will need O2 for discharge.

## 2019-01-07 LAB — COMPREHENSIVE METABOLIC PANEL
ALT: 16 U/L (ref 0–44)
AST: 11 U/L — ABNORMAL LOW (ref 15–41)
Albumin: 3.2 g/dL — ABNORMAL LOW (ref 3.5–5.0)
Alkaline Phosphatase: 60 U/L (ref 38–126)
Anion gap: 16 — ABNORMAL HIGH (ref 5–15)
BUN: 52 mg/dL — ABNORMAL HIGH (ref 8–23)
CO2: 22 mmol/L (ref 22–32)
Calcium: 8.6 mg/dL — ABNORMAL LOW (ref 8.9–10.3)
Chloride: 96 mmol/L — ABNORMAL LOW (ref 98–111)
Creatinine, Ser: 1.42 mg/dL — ABNORMAL HIGH (ref 0.61–1.24)
GFR calc Af Amer: 58 mL/min — ABNORMAL LOW (ref >60)
GFR calc non Af Amer: 50 mL/min — ABNORMAL LOW (ref >60)
Glucose, Bld: 52 mg/dL — ABNORMAL LOW (ref 70–99)
Potassium: 4.6 mmol/L (ref 3.5–5.1)
Sodium: 134 mmol/L — ABNORMAL LOW (ref 135–145)
Total Bilirubin: 0.7 mg/dL (ref 0.3–1.2)
Total Protein: 7.4 g/dL (ref 6.5–8.1)

## 2019-01-07 LAB — GLUCOSE, CAPILLARY
Glucose-Capillary: 239 mg/dL — ABNORMAL HIGH (ref 70–99)
Glucose-Capillary: 221 mg/dL — ABNORMAL HIGH (ref 70–99)
Glucose-Capillary: 313 mg/dL — ABNORMAL HIGH (ref 70–99)
Glucose-Capillary: 118 mg/dL — ABNORMAL HIGH (ref 70–99)
Glucose-Capillary: 98 mg/dL (ref 70–99)
Glucose-Capillary: 299 mg/dL — ABNORMAL HIGH (ref 70–99)
Glucose-Capillary: 106 mg/dL — ABNORMAL HIGH (ref 70–99)
Glucose-Capillary: 187 mg/dL — ABNORMAL HIGH (ref 70–99)

## 2019-01-07 LAB — CBC WITH DIFFERENTIAL/PLATELET
Abs Immature Granulocytes: 0.12 10*3/uL — ABNORMAL HIGH (ref 0.00–0.07)
Basophils Absolute: 0 10*3/uL (ref 0.0–0.1)
Basophils Relative: 0 %
Eosinophils Absolute: 0 10*3/uL (ref 0.0–0.5)
Eosinophils Relative: 0 %
HCT: 43.3 % (ref 39.0–52.0)
Hemoglobin: 14.2 g/dL (ref 13.0–17.0)
Immature Granulocytes: 1 %
Lymphocytes Relative: 31 %
Lymphs Abs: 2.7 10*3/uL (ref 0.7–4.0)
MCH: 28 pg (ref 26.0–34.0)
MCHC: 32.8 g/dL (ref 30.0–36.0)
MCV: 85.4 fL (ref 80.0–100.0)
Monocytes Absolute: 0.5 10*3/uL (ref 0.1–1.0)
Monocytes Relative: 6 %
Neutro Abs: 5.2 10*3/uL (ref 1.7–7.7)
Neutrophils Relative %: 62 %
Platelets: 496 10*3/uL — ABNORMAL HIGH (ref 150–400)
RBC: 5.07 MIL/uL (ref 4.22–5.81)
RDW: 12.8 % (ref 11.5–15.5)
WBC: 8.6 10*3/uL (ref 4.0–10.5)
nRBC: 0 % (ref 0.0–0.2)

## 2019-01-07 LAB — FERRITIN: Ferritin: 1022 ng/mL — ABNORMAL HIGH (ref 24–336)

## 2019-01-07 LAB — D-DIMER, QUANTITATIVE: D-Dimer, Quant: 1.46 ug/mL-FEU — ABNORMAL HIGH (ref 0.00–0.50)

## 2019-01-07 MED ORDER — METHYLPREDNISOLONE SODIUM SUCC 40 MG IJ SOLR
10.0000 mg | Freq: Every day | INTRAMUSCULAR | Status: DC
  Administered 2019-01-07: 10 mg via INTRAVENOUS
  Filled 2019-01-07: qty 1

## 2019-01-07 MED ORDER — INSULIN DETEMIR 100 UNIT/ML ~~LOC~~ SOLN: 12.0000 [IU] | Freq: Every day | SUBCUTANEOUS | 0 refills | Status: DC

## 2019-01-07 MED ORDER — INSULIN ASPART 100 UNIT/ML ~~LOC~~ SOLN: SUBCUTANEOUS | 0 refills | Status: AC

## 2019-01-07 MED ORDER — METHYLPREDNISOLONE 4 MG PO TBPK: ORAL_TABLET | ORAL | 0 refills | Status: DC

## 2019-01-07 MED ORDER — INSULIN DETEMIR 100 UNIT/ML ~~LOC~~ SOLN: 12.0000 [IU] | Freq: Every day | SUBCUTANEOUS | 0 refills | Status: AC

## 2019-01-07 MED ORDER — PREDNISONE 5 MG PO TABS: ORAL_TABLET | ORAL | 0 refills | Status: DC

## 2019-01-07 NOTE — TOC Transition Note (Signed)
Transition of Care [facility]) - CM/SW Discharge Note   Patient Details  Name: Bryan Oconnor MRN: [id number] Date of Birth: 30-Mar-1950  Transition of Care [facility]) CM/SW Contact:  Weston Anna, LCSW Phone Number: 01/07/2019, 10:57 AM   Clinical Narrative:     Patient set to discharge home today- o2 will be provided by [facility]. All DME orders placed by MD. Portable tank provided for transportation.   Final next level of care: Home/Self Care Barriers to Discharge: No Barriers Identified   Patient Goals and CMS Choice        Discharge Placement                       Discharge Plan and Services                DME Arranged: Oxygen DME Agency: [place] Date DME Agency Contacted: 01/07/19 Time DME Agency Contacted: [phone] Representative spoke with at DME Agency: Magda Paganini [facility] Arranged: NA          Social Determinants of Health ([place]) Interventions     Readmission Risk Interventions No flowsheet data found.

## 2019-01-07 NOTE — Discharge Instructions (Signed)
Follow with Primary MD in 7 days   Get CBC, CMP, 2 view Chest X ray -  checked next visit within 1 week by Primary MD   Activity: As tolerated with Full fall precautions use walker/cane & assistance as needed  Disposition Home    Diet: Heart Healthy Low Carb   Accuchecks 4 times/day, Once in AM empty stomach and then before each meal. Log in all results and show them to your Prim.MD in 3 days. If any glucose reading is under 80 or above 300 call your Prim MD immidiately. Follow Low glucose instructions for glucose under 80 as instructed.   Special Instructions: If you have smoked or chewed Tobacco  in the last 2 yrs please stop smoking, stop any regular Alcohol  and or any Recreational drug use.  On your next visit with your primary care physician please Get Medicines reviewed and adjusted.  Please request your Prim.MD to go over all Hospital Tests and Procedure/Radiological results at the follow up, please get all Hospital records sent to your Prim MD by signing hospital release before you go home.  If you experience worsening of your admission symptoms, develop shortness of breath, life threatening emergency, suicidal or homicidal thoughts you must seek medical attention immediately by calling 911 or calling your MD immediately  if symptoms less severe.  You Must read complete instructions/literature along with all the possible adverse reactions/side effects for all the Medicines you take and that have been prescribed to you. Take any new Medicines after you have completely understood and accpet all the possible adverse reactions/side effects.       Person Under Monitoring Name: Bryan Oconnor  Location: [facility] [place] [address]   Infection Prevention Recommendations for Individuals Confirmed to have, or Being Evaluated for, 2019 Novel Coronavirus (COVID-19) Infection Who Receive Care at Home  Individuals who are confirmed to have, or are being evaluated  for, COVID-19 should follow the prevention steps below until a healthcare provider or local or state health department says they can return to normal activities.  Stay home except to get medical care You should restrict activities outside your home, except for getting medical care. Do not go to work, school, or public areas, and do not use public transportation or taxis.  Call ahead before visiting your doctor Before your medical appointment, call the healthcare provider and tell them that you have, or are being evaluated for, COVID-19 infection. This will help the healthcare providers office take steps to keep other people from getting infected. Ask your healthcare provider to call the local or state health department.  Monitor your symptoms Seek prompt medical attention if your illness is worsening (e.g., difficulty breathing). Before going to your medical appointment, call the healthcare provider and tell them that you have, or are being evaluated for, COVID-19 infection. Ask your healthcare provider to call the local or state health department.  Wear a facemask You should wear a facemask that covers your nose and mouth when you are in the same room with other people and when you visit a healthcare provider. People who live with or visit you should also wear a facemask while they are in the same room with you.  Separate yourself from other people in your home As much as possible, you should stay in a different room from other people in your home. Also, you should use a separate bathroom, if available.  Avoid sharing household items You should not share dishes, drinking glasses, cups,  eating utensils, towels, bedding, or other items with other people in your home. After using these items, you should wash them thoroughly with soap and water.  Cover your coughs and sneezes Cover your mouth and nose with a tissue when you cough or sneeze, or you can cough or sneeze into your sleeve.  Throw used tissues in a lined trash can, and immediately wash your hands with soap and water for at least 20 seconds or use an alcohol-based hand rub.  Wash your [employer]hands Wash your hands often and thoroughly with soap and water for at least 20 seconds. You can use an alcohol-based hand sanitizer if soap and water are not available and if your hands are not visibly dirty. Avoid touching your eyes, nose, and mouth with unwashed hands.   Prevention Steps for Caregivers and Household Members of Individuals Confirmed to have, or Being Evaluated for, COVID-19 Infection Being Cared for in the Home  If you live with, or provide care at home for, a person confirmed to have, or being evaluated for, COVID-19 infection please follow these guidelines to prevent infection:  Follow healthcare providers instructions Make sure that you understand and can help the patient follow any healthcare provider instructions for all care.  Provide for the patients basic needs You should help the patient with basic needs in the home and provide support for getting groceries, prescriptions, and other personal needs.  Monitor the patients symptoms If they are getting sicker, call his or her medical provider and tell them that the patient has, or is being evaluated for, COVID-19 infection. This will help the healthcare providers office take steps to keep other people from getting infected. Ask the healthcare provider to call the local or state health department.  Limit the number of people who have contact with the patient  If possible, have only one caregiver for the patient.  Other household members should stay in another home or place of residence. If this is not possible, they should stay  in another room, or be separated from the patient as much as possible. Use a separate bathroom, if available.  Restrict visitors who do not have an essential need to be in the home.  Keep older adults, very young  children, and other sick people away from the patient Keep older adults, very young children, and those who have compromised immune systems or chronic health conditions away from the patient. This includes people with chronic heart, lung, or kidney conditions, diabetes, and cancer.  Ensure good ventilation Make sure that shared spaces in the home have good air flow, such as from an air conditioner or an opened window, weather permitting.  Wash your hands often  Wash your hands often and thoroughly with soap and water for at least 20 seconds. You can use an alcohol based hand sanitizer if soap and water are not available and if your hands are not visibly dirty.  Avoid touching your eyes, nose, and mouth with unwashed hands.  Use disposable paper towels to dry your hands. If not available, use dedicated cloth towels and replace them when they become wet.  Wear a facemask and gloves  Wear a disposable facemask at all times in the room and gloves when you touch or have contact with the patients blood, body fluids, and/or secretions or excretions, such as sweat, saliva, sputum, nasal mucus, vomit, urine, or feces.  Ensure the mask fits over your nose and mouth tightly, and do not touch it during use.  Throw out  disposable facemasks and gloves after using them. Do not reuse.  Wash your hands immediately after removing your facemask and gloves.  If your personal clothing becomes contaminated, carefully remove clothing and launder. Wash your hands after handling contaminated clothing.  Place all used disposable facemasks, gloves, and other waste in a lined container before disposing them with other household waste.  Remove gloves and wash your hands immediately after handling these items.  Do not share dishes, glasses, or other household items with the patient  Avoid sharing household items. You should not share dishes, drinking glasses, cups, eating utensils, towels, bedding, or other items  with a patient who is confirmed to have, or being evaluated for, COVID-19 infection.  After the person uses these items, you should wash them thoroughly with soap and water.  Wash laundry thoroughly  Immediately remove and wash clothes or bedding that have blood, body fluids, and/or secretions or excretions, such as sweat, saliva, sputum, nasal mucus, vomit, urine, or feces, on them.  Wear gloves when handling laundry from the patient.  Read and follow directions on labels of laundry or clothing items and detergent. In general, wash and dry with the warmest temperatures recommended on the label.  Clean all areas the individual has used often  Clean all touchable surfaces, such as counters, tabletops, doorknobs, bathroom fixtures, toilets, phones, keyboards, tablets, and bedside tables, every day. Also, clean any surfaces that may have blood, body fluids, and/or secretions or excretions on them.  Wear gloves when cleaning surfaces the patient has come in contact with.  Use a diluted bleach solution (e.g., dilute bleach with 1 part bleach and 10 parts water) or a household disinfectant with a label that says EPA-registered for coronaviruses. To make a bleach solution at home, add 1 tablespoon of bleach to 1 quart (4 cups) of water. For a larger supply, add  cup of bleach to 1 gallon (16 cups) of water.  Read labels of cleaning products and follow recommendations provided on product labels. Labels contain instructions for safe and effective use of the cleaning product including precautions you should take when applying the product, such as wearing gloves or eye protection and making sure you have good ventilation during use of the product.  Remove gloves and wash hands immediately after cleaning.  Monitor yourself for signs and symptoms of illness Caregivers and household members are considered close contacts, should monitor their health, and will be asked to limit movement outside of the  home to the extent possible. Follow the monitoring steps for close contacts listed on the symptom monitoring form.   ? If you have additional questions, contact your local health department or call the epidemiologist on call at [phone] (available 24/7). ? This guidance is subject to change. For the most up-to-date guidance from [facility], please refer to their website: [email or web address]

## 2019-01-07 NOTE — Progress Notes (Signed)
Inpatient Diabetes Program Recommendations  AACE/ADA: New Consensus Statement on Inpatient Glycemic Control (2015)  Target Ranges:  Prepandial:   less than 140 mg/dL      Peak postprandial:   less than 180 mg/dL (1-2 hours)      Critically ill patients:  140 - 180 mg/dL   Lab Results  Component Value Date   GLUCAP 106 (H) 01/07/2019   HGBA1C 11.5 (H) 01/03/2019    Spoke with patient over the phone regarding A1c % and glucose control at home. Main teaching focused on insulin at time of d/c. Discussed Levemir and Novolog insulins. When to take them and how they work. Discussed hypoglycemia s/s and treatment for it. Discussed glucose goals. Attached DM control information to AVS. Also called wife for email to send videos on how to operate insulin pen, how to check CBG, and what to do for hypoglycemia.  Thanks,  Tama Headings RN, MSN, BC-ADM Inpatient Diabetes Coordinator Team Pager [phone] (8a-5p)

## 2019-01-07 NOTE — Care Management Important Message (Signed)
Important Message  Patient Details  Name: Bryan Oconnor MRN: [id number] Date of Birth: [birth year]   Medicare Important Message Given:  Yes - Important Message mailed due to current National Emergency  Verbal consent obtained due to current National Emergency  Relationship to patient: Spouse/Significant Other Contact Name: [name] Call Date: 01/07/19  Time: 1142 Phone: [phone]   Important Message mailed to: Other (must enter comment)(Patients wife declined physical copy. She is aware copy is available if needed.)    [name] 01/07/2019, 11:43 AM

## 2019-01-07 NOTE — Discharge Planning (Signed)
Patient IV removed.  RN assessment and VS revealed stability for DC to home with O2.  O2 delivered to home and hospital room, prior to DC.  Discharge papers given, explained and educated.  Educated on need to self isolate for two weeks beyond DC and continue to monitor s/sx of self and anyone else in the home.  Used teach back to explain/teach proper O2 use and insulin administration.  Used practice pen and also discussed "what if" senarios.  Scripts printed and given.  PCP FU appt made and pulmonologist will contact patient to set up patent. DC contract signed and placed on chart.  Once ready, will be wheeled to front and family transporting home via car.

## 2019-01-07 NOTE — Discharge Summary (Signed)
Bryan Oconnor WUJ:[id number] DOB: February 24, 1951 DOA: 01/03/2019  PCP: Patient, No Pcp Per  Admit date: 01/03/2019  Discharge date: 01/07/2019  Admitted From: Home  Disposition:  Home   Recommendations for Outpatient Follow-up:   Follow up with PCP in 1-2 weeks  PCP Please obtain BMP/CBC, 2 view CXR in 1week,  (see Discharge instructions)   PCP Please follow up on the following pending results:    Home Health: None   Equipment/Devices: 2lit o2  Consultations: None  Discharge Condition: Stable    CODE STATUS: Full    Diet Recommendation: Heart Healthy Low Carb  CC - SOB   Brief history of present illness from the day of admission and additional interim summary    Patient is a 68 y.o. 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                                                                  Hospital Course    Acute Hypoxic Resp Failure due to post Covid lung inflammation with fibrosis: Remains stable-still gets short of breath with exertion-continue IV steroids-continue ambulation with PT/nursing staff.  We will plan on transitioning to oral steroids in the next few days. Spoke with PCCM-Dr. Kym Groom will arrange for outpatient follow-up in his office.     Although he is COVID-19 positive-he does not have active Covid pneumonia at this point.  NFA:OZHYQ pressure currently controlled-monitor without the use of antihypertensives  DM-2 with uncontrolled hyperglycemia (A1c 11.5 on 01/03/2019): Poor outpt control due to Hyperglycemia - Home Rx given + ISS  Lab Results    Component Value Date   HGBA1C 11.5 (H) 01/03/2019   CBG (last 3)  Recent Labs    01/06/19 2055 01/06/19 2259 01/07/19 0715  GLUCAP 471* 299* 106*   .  CKD stage MVH:QIONGEXBMW close to usual baseline-follow.  Discharge diagnosis     Principal Problem:   Acute hypoxemic respiratory failure (HCC) Active Problems:   Essential hypertension   Uncontrolled diabetes mellitus with hyperglycemia (HCC)   CKD stage 3 secondary to diabetes [facility])    Discharge instructions    Discharge Instructions    Discharge instructions   Complete by: As directed    Follow with Primary MD in 7 days   Get CBC, CMP, 2 view Chest X ray -  checked next visit within 1 week by Primary MD   Activity: As tolerated with Full fall precautions use walker/cane & assistance as needed  Disposition Home    Diet: Heart Healthy Low Carb   Accuchecks 4 times/day, Once in AM empty stomach and then before  each meal. Log in all results and show them to your Prim.MD in 3 days. If any glucose reading is under 80 or above 300 call your Prim MD immidiately. Follow Low glucose instructions for glucose under 80 as instructed.   Special Instructions: If you have smoked or chewed Tobacco  in the last 2 yrs please stop smoking, stop any regular Alcohol  and or any Recreational drug use.  On your next visit with your primary care physician please Get Medicines reviewed and adjusted.  Please request your Prim.MD to go over all Hospital Tests and Procedure/Radiological results at the follow up, please get all Hospital records sent to your Prim MD by signing hospital release before you go home.  If you experience worsening of your admission symptoms, develop shortness of breath, life threatening emergency, suicidal or homicidal thoughts you must seek medical attention immediately by calling 911 or calling your MD immediately  if symptoms less severe.  You Must read complete instructions/literature along with all the  possible adverse reactions/side effects for all the Medicines you take and that have been prescribed to you. Take any new Medicines after you have completely understood and accpet all the possible adverse reactions/side effects.   Increase activity slowly   Complete by: As directed    MyChart COVID-19 home monitoring program   Complete by: Jan 07, 2019    Is the patient willing to use the MyChart Mobile App for home monitoring?: Yes   Temperature monitoring   Complete by: Jan 07, 2019    After how many days would you like to receive a notification of this patient's flowsheet entries?: 1      Discharge Medications   Allergies as of 01/07/2019      Reactions   Pollen Extract Cough      Medication List    STOP taking these medications   dexamethasone 2 MG tablet Commonly known as: DECADRON     TAKE these medications   acetaminophen 500 MG tablet Commonly known as: TYLENOL Take 500 mg by mouth every 6 (six) hours as needed for mild pain.   aspirin EC 81 MG tablet Take 81 mg by mouth daily.   atorvastatin 40 MG tablet Commonly known as: LIPITOR Take 40 mg by mouth daily.   benzonatate 200 MG capsule Commonly known as: TESSALON Take 1 capsule (200 mg total) by mouth 3 (three) times daily.   famotidine 20 MG tablet Commonly known as: PEPCID Take 1 tablet (20 mg total) by mouth daily for 10 days.   finasteride 5 MG tablet Commonly known as: PROSCAR Take 5 mg by mouth daily.   gabapentin 300 MG capsule Commonly known as: NEURONTIN Take 600 mg by mouth at bedtime.   glipiZIDE 10 MG 24 hr tablet Commonly known as: GLUCOTROL XL Take 10 mg by mouth daily.   insulin aspart 100 UNIT/ML injection Commonly known as: NovoLOG Can switch to any other cheaper alternative.. Before each meal 3 times a day , 140-199 - 2 units, 200-250 - 4 units, 251-299 - 6 units,  300-349 - 8 units,  350 or above 10 units. Insulin PEN if approved, provide syringes and needles if needed.     insulin detemir 100 UNIT/ML injection Commonly known as: Levemir Inject 0.12 mLs (12 Units total) into the skin daily.   metFORMIN 1000 MG tablet Commonly known as: GLUCOPHAGE Take 1,000 mg by mouth 2 (two) times daily with a meal.   predniSONE 5 MG tablet Commonly known as: DELTASONE Label  &  dispense according to the schedule below. take 8 Pills PO for 3 days, 6 Pills PO for 3 days, 4 Pills PO for 3 days, 2 Pills PO for 3 days, 1 Pills PO for 3 days, 1/2 Pill  PO for 3 days then STOP. Total 65 pills.            Durable Medical Equipment  (From admission, onward)         Start     Ordered   01/04/19 1718  For home use only DME oxygen  Once    Question Answer Comment  Length of Need 6 Months   Mode or (Route) Nasal cannula   Liters per Minute 2   Frequency Continuous (stationary and portable oxygen unit needed)   Oxygen conserving device Yes   Oxygen delivery system Gas      01/04/19 1718          Follow-up Information    Kalman Shan, MD. Schedule an appointment as soon as possible for a visit in 2 week(s).   Specialty: Pulmonary Disease Why: office will call you, if you do not hear from them in a few days, please give them a call. Contact information: [address] [place] [address] [phone]        [facility]. Schedule an appointment as soon as possible for a visit in 1 week(s).   Contact information: [address] [place] [address] [phone]          Major procedures and Radiology Reports - PLEASE review detailed and final reports thoroughly  -        Dg Chest 1 View  Result Date: 12/17/2018 CLINICAL DATA:  Fever. EXAM: CHEST  1 VIEW COMPARISON:  December 16, 2018. FINDINGS: The heart size and mediastinal contours are within normal limits. Both lungs are clear. The visualized skeletal structures are unremarkable. IMPRESSION: No active disease. Electronically Signed    By: Lupita Raider M.D.   On: 12/17/2018 08:59   Ct Chest Wo Contrast  Result Date: 01/03/2019 CLINICAL DATA:  Inpatient.  COVID-19 pneumonia. EXAM: CT CHEST WITHOUT CONTRAST TECHNIQUE: Multidetector CT imaging of the chest was performed following the standard protocol without IV contrast. COMPARISON:  Chest radiograph from one day prior. FINDINGS: Motion degraded scan, limiting assessment. Cardiovascular: Normal heart size. No significant pericardial effusion/thickening. Normal course and caliber of the thoracic aorta. Top-normal caliber main pulmonary artery (3.2 cm diameter). Mediastinum/Nodes: No discrete thyroid nodules. Unremarkable esophagus. No axillary adenopathy. Mildly enlarged 1.1 cm subcarinal node (series 2/image 28). No discrete hilar adenopathy on this noncontrast scan. Lungs/Pleura: No pneumothorax. No pleural effusion. Moderate centrilobular and paraseptal emphysema. There is extensive patchy confluent crazy paving opacity throughout both lungs involving all lung lobes with involvement of the peripheral, perilobular and peribronchovascular lungs, without a clear apicobasilar gradient to these findings. No discrete lung masses or significant pulmonary nodules. Suggestion of mild parenchymal distortion and mild traction bronchiectasis throughout both lungs. Upper abdomen: No acute abnormality. Musculoskeletal: No aggressive appearing focal osseous lesions. Mild thoracic spondylosis. IMPRESSION: 1. Limited motion degraded scan. Extensive patchy confluent crazy paving opacity throughout both lungs with some associated parenchymal distortion and suggestion of mild traction bronchiectasis. Findings are compatible with severe COVID-19 pneumonia with suggestion of superimposed early postinflammatory fibrosis. Consider follow-up high-resolution chest CT study in 3-6 months, as clinically warranted. 2. Mild subcarinal lymphadenopathy, nonspecific, presumably reactive, which can also be reassessed on  follow-up chest CT. Emphysema (  ICD10-J43.9). Electronically Signed   By: Delbert Phenix M.D.   On: 01/03/2019 15:54   Dg Chest Portable 1 View  Result Date: 01/02/2019 CLINICAL DATA:  Short of breath EXAM: PORTABLE CHEST 1 VIEW COMPARISON:  12/17/2018 FINDINGS: Diffuse bilateral interstitial and ground-glass opacity with peripheral consolidations on the right. No pleural effusion. Normal heart size. No pneumothorax. IMPRESSION: Development of fairly extensive interstitial and ground-glass opacities, consistent with multifocal pneumonia and provided clinical history of COVID-19 positivity. Electronically Signed   By: Jasmine Pang M.D.   On: 01/02/2019 18:05   Dg Chest Portable 1 View  Result Date: 12/16/2018 CLINICAL DATA:  Fever.  COVID-19 positive. EXAM: PORTABLE CHEST 1 VIEW COMPARISON:  None. FINDINGS: Cardiomediastinal silhouette is normal. Mediastinal contours appear intact. There is no evidence of focal airspace consolidation, pleural effusion or pneumothorax. Osseous structures are without acute abnormality. Soft tissues are grossly normal. IMPRESSION: No active disease. Electronically Signed   By: Ted Mcalpine M.D.   On: 12/16/2018 16:01   Vas [place] Lower Extremity Venous (dvt)  Result Date: 01/04/2019  Lower Venous Study Indications: Covid with elevated d-dimer.  Comparison Study: No prior. Performing Technologist: Marilynne Halsted RDMS, RVT  Examination Guidelines: A complete evaluation includes B-mode imaging, spectral Doppler, color Doppler, and power Doppler as needed of all accessible portions of each vessel. Bilateral testing is considered an integral part of a complete examination. Limited examinations for reoccurring indications may be performed as noted.  +---------+---------------+---------+-----------+----------+--------------+  RIGHT     Compressibility Phasicity Spontaneity Properties Thrombus Aging  +---------+---------------+---------+-----------+----------+--------------+   CFV       Full            Yes       Yes                                    +---------+---------------+---------+-----------+----------+--------------+  SFJ       Full                                                             +---------+---------------+---------+-----------+----------+--------------+  FV Prox   Full                                                             +---------+---------------+---------+-----------+----------+--------------+  FV Mid    Full                                                             +---------+---------------+---------+-----------+----------+--------------+  FV Distal Full                                                             +---------+---------------+---------+-----------+----------+--------------+  PFV       Full                                                             +---------+---------------+---------+-----------+----------+--------------+  POP       Full            Yes       Yes                                    +---------+---------------+---------+-----------+----------+--------------+  PTV       Full                                                             +---------+---------------+---------+-----------+----------+--------------+  PERO      Full                                                             +---------+---------------+---------+-----------+----------+--------------+   +---------+---------------+---------+-----------+----------+--------------+  LEFT      Compressibility Phasicity Spontaneity Properties Thrombus Aging  +---------+---------------+---------+-----------+----------+--------------+  CFV       Full            Yes       Yes                                    +---------+---------------+---------+-----------+----------+--------------+  SFJ       Full                                                             +---------+---------------+---------+-----------+----------+--------------+  FV Prox   Full                                                              +---------+---------------+---------+-----------+----------+--------------+  FV Mid    Full                                                             +---------+---------------+---------+-----------+----------+--------------+  FV Distal Full                                                             +---------+---------------+---------+-----------+----------+--------------+  PFV       Full                                                             +---------+---------------+---------+-----------+----------+--------------+  POP       Full            Yes       Yes                                    +---------+---------------+---------+-----------+----------+--------------+  PTV       Full                                                             +---------+---------------+---------+-----------+----------+--------------+  PERO      Full                                                             +---------+---------------+---------+-----------+----------+--------------+     Summary: Right: There is no evidence of deep vein thrombosis in the lower extremity. No cystic structure found in the popliteal fossa. Left: There is no evidence of deep vein thrombosis in the lower extremity. No cystic structure found in the popliteal fossa.  *See table(s) above for measurements and observations. Electronically signed by Deitra Mayo MD on 01/04/2019 at 2:19:24 PM.    Final     Micro Results     Recent Results (from the past 240 hour(s))  SARS Coronavirus 2 by RT PCR (hospital order, performed in [facility] hospital lab) Nasopharyngeal Nasopharyngeal Swab     Status: Abnormal   Collection Time: 01/02/19  7:55 PM   Specimen: Nasopharyngeal Swab  Result Value Ref Range Status   SARS Coronavirus 2 POSITIVE (A) NEGATIVE Final    Comment: RESULT CALLED TO, READ BACK BY AND VERIFIED WITH: GRACIE WINEMAN @2135  01/02/19 MJU (NOTE) If result is NEGATIVE SARS-CoV-2 target nucleic  acids are NOT DETECTED. The SARS-CoV-2 RNA is generally detectable in upper and lower  respiratory specimens during the acute phase of infection. The lowest  concentration of SARS-CoV-2 viral copies this assay can detect is 250  copies / mL. A negative result does not preclude SARS-CoV-2 infection  and should not be used as the sole basis for treatment or other  patient management decisions.  A negative result may occur with  improper specimen collection / handling, submission of specimen other  than nasopharyngeal swab, presence of viral mutation(s) within the  areas targeted by this assay, and inadequate number of viral copies  (<250 copies / mL). A negative result must be combined with clinical  observations, patient history, and epidemiological information. If result is POSITIVE SARS-CoV-2 target nucleic acids are DETECTED. The  SARS-CoV-2 RNA is generally detectable in upper and lower  respiratory specimens during the acute phase of infection.  Positive  results are indicative  of active infection with SARS-CoV-2.  Clinical  correlation with patient history and other diagnostic information is  necessary to determine patient infection status.  Positive results do  not rule out bacterial infection or co-infection with other viruses. If result is PRESUMPTIVE POSTIVE SARS-CoV-2 nucleic acids MAY BE PRESENT.   A presumptive positive result was obtained on the submitted specimen  and confirmed on repeat testing.  While 2019 novel coronavirus  (SARS-CoV-2) nucleic acids may be present in the submitted sample  additional confirmatory testing may be necessary for epidemiological  and / or clinical management purposes  to differentiate between  SARS-CoV-2 and other Sarbecovirus currently known to infect humans.  If clinically indicated additional testing with an alternate test  methodology [phone]) is a dvised. The SARS-CoV-2 RNA is generally  detectable in upper and lower respiratory  specimens during the acute  phase of infection. The expected result is Negative. Fact Sheet for Patients:  BoilerBrush.com.cy Fact Sheet for Healthcare Providers: https://pope.com/ This test is not yet approved or cleared by the [place] FDA and has been authorized for detection and/or diagnosis of SARS-CoV-2 by FDA under an Emergency Use Authorization (EUA).  This EUA will remain in effect (meaning this test can be used) for the duration of the COVID-19 declaration under Section 564(b)(1) of the Act, 21 U.S.C. section 360bbb-3(b)(1), unless the authorization is terminated or revoked sooner. Performed at [facility], [address] [place], [place] [address]   Respiratory Panel by PCR     Status: None   Collection Time: 01/03/19  1:55 PM   Specimen: Nasopharyngeal Swab; Respiratory  Result Value Ref Range Status   Adenovirus NOT DETECTED NOT DETECTED Final   Coronavirus 229E NOT DETECTED NOT DETECTED Final    Comment: (NOTE) The Coronavirus on the Respiratory Panel, DOES NOT test for the novel  Coronavirus (2019 nCoV)    Coronavirus HKU1 NOT DETECTED NOT DETECTED Final   Coronavirus NL63 NOT DETECTED NOT DETECTED Final   Coronavirus OC43 NOT DETECTED NOT DETECTED Final   Metapneumovirus NOT DETECTED NOT DETECTED Final   Rhinovirus / Enterovirus NOT DETECTED NOT DETECTED Final   Influenza A NOT DETECTED NOT DETECTED Final   Influenza B NOT DETECTED NOT DETECTED Final   Parainfluenza Virus 1 NOT DETECTED NOT DETECTED Final   Parainfluenza Virus 2 NOT DETECTED NOT DETECTED Final   Parainfluenza Virus 3 NOT DETECTED NOT DETECTED Final   Parainfluenza Virus 4 NOT DETECTED NOT DETECTED Final   Respiratory Syncytial Virus NOT DETECTED NOT DETECTED Final   Bordetella pertussis NOT DETECTED NOT DETECTED Final   Chlamydophila pneumoniae NOT DETECTED NOT DETECTED Final   Mycoplasma pneumoniae NOT DETECTED NOT DETECTED Final      Comment: Performed at [facility], [address] [place], [place] [address]    Today   Subjective    Bryan Oconnor today has no headache,no chest abdominal pain,no new weakness tingling or numbness, feels much better wants to go home today.     Objective   Blood pressure (!) 123/91, pulse 94, temperature 98.1 F (36.7 C), temperature source Oral, resp. rate 20, height 5\' 6"  (1.676 m), weight 56.7 kg, SpO2 90 %.   Intake/Output Summary (Last 24 hours) at 01/07/2019 0939 Last data filed at 01/07/2019 0352 Gross per 24 hour  Intake 840 ml  Output 650 ml  Net 190 ml    Exam Awake Alert, Oriented x 3, No new F.N deficits, Normal affect [place].AT,PERRAL Supple Neck,No JVD, No cervical lymphadenopathy  appriciated.  Symmetrical Chest wall movement, Good air movement bilaterally, CTAB RRR,No Gallops,Rubs or new Murmurs, No Parasternal Heave +ve B.Sounds, Abd Soft, Non tender, No organomegaly appriciated, No rebound -guarding or rigidity. No Cyanosis, Clubbing or edema, No new Rash or bruise   Data Review   CBC w Diff:  Lab Results  Component Value Date   WBC 8.6 01/07/2019   HGB 14.2 01/07/2019   HCT 43.3 01/07/2019   PLT 496 (H) 01/07/2019   LYMPHOPCT 31 01/07/2019   MONOPCT 6 01/07/2019   EOSPCT 0 01/07/2019   BASOPCT 0 01/07/2019    CMP:  Lab Results  Component Value Date   NA 134 (L) 01/07/2019   K 4.6 01/07/2019   CL 96 (L) 01/07/2019   CO2 22 01/07/2019   BUN 52 (H) 01/07/2019   CREATININE 1.42 (H) 01/07/2019   PROT 7.4 01/07/2019   ALBUMIN 3.2 (L) 01/07/2019   BILITOT 0.7 01/07/2019   ALKPHOS 60 01/07/2019   AST 11 (L) 01/07/2019   ALT 16 01/07/2019  .  Lab Results  Component Value Date   HGBA1C 11.5 (H) 01/03/2019     Total Time in preparing paper work, data evaluation and todays exam - 35 minutes  Susa RaringPrashant Andres Bantz M.D on 01/07/2019 at 9:39 AM  [facility]   Office  [phone]

## 2019-01-08 ENCOUNTER — Other Ambulatory Visit: Payer: Self-pay

## 2019-01-08 ENCOUNTER — Ambulatory Visit (INDEPENDENT_AMBULATORY_CARE_PROVIDER_SITE_OTHER): Payer: PPO | Admitting: Primary Care

## 2019-01-08 DIAGNOSIS — U071 COVID-19: Secondary | ICD-10-CM

## 2019-01-08 DIAGNOSIS — J9601 Acute respiratory failure with hypoxia: Secondary | ICD-10-CM

## 2019-01-08 DIAGNOSIS — Z09 Encounter for follow-up examination after completed treatment for conditions other than malignant neoplasm: Secondary | ICD-10-CM | POA: Diagnosis not present

## 2019-01-08 NOTE — Progress Notes (Signed)
Virtual Visit via Telephone Note  I connected with Bryan Oconnor on 01/08/19 at  1:50 PM EST by telephone and verified that I am speaking with the correct person using two identifiers.   I discussed the limitations, risks, security and privacy concerns of performing an evaluation and management service by telephone and the availability of in person appointments. I also discussed with the patient that there may be a patient responsible charge related to this service. The patient expressed understanding and agreed to proceed.   History of Present Illness: Bryan Oconnor is having a tele visit for a hospital follow up for pneumonia /COVID.  He presented to [facility] with exertional dyspnea. Bryan Oconnor  was admitted to [facility] from 10/12-10/17-treated for Covid pneumonia and treated with steroids and remdesivir.  He noticed that on Sunday evening/Monday morning then he was getting short of breath when he was moving around.  This shortness of been gradually worsened to the extent where he was only able to walk a few feet before having to rest.  He denied any orthopnea, PND or lower extremity edema.  Patient denied any chest pain.  He denies any swelling of his lower extremities.  Past Medical History:  Diagnosis Date  . Diabetes mellitus without complication ([place])   . Hypertension       Observations/Objective: Review of Systems  Respiratory: Positive for shortness of breath.   Genitourinary: Positive for frequency.  All other systems reviewed and are negative.   Assessment and Plan: Bryan Oconnor was seen today for hospitalization follow-up.  Diagnoses and all orders for this visit:  Hospital discharge follow-up Patient tested positive for COVID-19 on 12/11/2018 presents to the emergency department for difficulty sleeping, body aches and fever. Admitted to [facility] from 10/12-10/17-treated for Covid pneumonia with steroids and remdesivir.Marland Kitchen Discharge recommended  follow up with PCP and repeat blood work.  COVID-19 virus infection Test for COVID-19/ pneumonia was positive, meaning that you were infected with the novel coronavirus and could give the germ to others.  Please continue isolation at home, for at least 10 days since the start of your fever/cough/breathlessness and until you have had 3 consecutive days without fever (without taking a fever reducer) and with cough/breathlessness improving. Please continue good preventive care measures, including:  frequent hand-washing, avoid touching your face, cover coughs/sneezes, stay out of crowds and keep a 6 foot distance from others.  Recheck or go to the nearest hospital ED tent for re-assessment if fever/cough/breathlessness return.  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 Ivery Quale-.    Follow Up Instructions:    I discussed the assessment and treatment plan with the patient. The patient was provided an opportunity to ask questions and all were answered. The patient agreed with the plan and demonstrated an understanding of the instructions.   The patient was advised to call back or seek an in-person evaluation if the symptoms worsen or if the condition fails to improve as anticipated.  I provided   minutes of  25 non-face-to-face time during this encounter. Includes reviewing encounters labs and imaging   Kerin Perna, NP

## 2019-01-08 NOTE — Progress Notes (Signed)
Will need 3 month follow up for DM

## 2019-01-09 ENCOUNTER — Emergency Department: Payer: PPO

## 2019-01-09 ENCOUNTER — Emergency Department
Admission: EM | Admit: 2019-01-09 | Discharge: 2019-01-10 | Disposition: A | Discharge status: Home / Self Care | Payer: PPO | Attending: Emergency Medicine | Admitting: Emergency Medicine

## 2019-01-09 ENCOUNTER — Encounter: Payer: Self-pay | Admitting: *Deleted

## 2019-01-09 ENCOUNTER — Other Ambulatory Visit: Payer: Self-pay

## 2019-01-09 DIAGNOSIS — Z794 Long term (current) use of insulin: Secondary | ICD-10-CM | POA: Diagnosis not present

## 2019-01-09 DIAGNOSIS — I129 Hypertensive chronic kidney disease with stage 1 through stage 4 chronic kidney disease, or unspecified chronic kidney disease: Secondary | ICD-10-CM | POA: Diagnosis not present

## 2019-01-09 DIAGNOSIS — Z7982 Long term (current) use of aspirin: Secondary | ICD-10-CM | POA: Insufficient documentation

## 2019-01-09 DIAGNOSIS — R739 Hyperglycemia, unspecified: Secondary | ICD-10-CM

## 2019-01-09 DIAGNOSIS — N183 Chronic kidney disease, stage 3 unspecified: Secondary | ICD-10-CM | POA: Diagnosis not present

## 2019-01-09 DIAGNOSIS — Z79899 Other long term (current) drug therapy: Secondary | ICD-10-CM | POA: Diagnosis not present

## 2019-01-09 DIAGNOSIS — E1165 Type 2 diabetes mellitus with hyperglycemia: Secondary | ICD-10-CM | POA: Diagnosis not present

## 2019-01-09 DIAGNOSIS — R059 Cough, unspecified: Secondary | ICD-10-CM

## 2019-01-09 DIAGNOSIS — R05 Cough: Secondary | ICD-10-CM

## 2019-01-09 DIAGNOSIS — R000 Tachycardia, unspecified: Secondary | ICD-10-CM | POA: Diagnosis not present

## 2019-01-09 LAB — URINALYSIS, COMPLETE (UACMP) WITH MICROSCOPIC
Bacteria, UA: NONE SEEN
Bilirubin Urine: NEGATIVE
Glucose, UA: >=500 mg/dL — AB
Hgb urine dipstick: NEGATIVE
Ketones, ur: NEGATIVE mg/dL
Leukocytes,Ua: NEGATIVE
Nitrite: NEGATIVE
Protein, ur: NEGATIVE mg/dL
Specific Gravity, Urine: 1.021 (ref 1.005–1.030)
Squamous Epithelial / HPF: NONE SEEN (ref 0–5)
pH: 5 (ref 5.0–8.0)

## 2019-01-09 LAB — CBC
HCT: 35.9 % — ABNORMAL LOW (ref 39.0–52.0)
Hemoglobin: 11.8 g/dL — ABNORMAL LOW (ref 13.0–17.0)
MCH: 27.7 pg (ref 26.0–34.0)
MCHC: 32.9 g/dL (ref 30.0–36.0)
MCV: 84.3 fL (ref 80.0–100.0)
Platelets: 420 10*3/uL — ABNORMAL HIGH (ref 150–400)
RBC: 4.26 MIL/uL (ref 4.22–5.81)
RDW: 13.1 % (ref 11.5–15.5)
WBC: 8.6 10*3/uL (ref 4.0–10.5)
nRBC: 0 % (ref 0.0–0.2)

## 2019-01-09 LAB — BASIC METABOLIC PANEL
Anion gap: 12 (ref 5–15)
BUN: 54 mg/dL — ABNORMAL HIGH (ref 8–23)
CO2: 22 mmol/L (ref 22–32)
Calcium: 8.8 mg/dL — ABNORMAL LOW (ref 8.9–10.3)
Chloride: 99 mmol/L (ref 98–111)
Creatinine, Ser: 1.69 mg/dL — ABNORMAL HIGH (ref 0.61–1.24)
GFR calc Af Amer: 47 mL/min — ABNORMAL LOW (ref >60)
GFR calc non Af Amer: 41 mL/min — ABNORMAL LOW (ref >60)
Glucose, Bld: 403 mg/dL — ABNORMAL HIGH (ref 70–99)
Potassium: 6 mmol/L — ABNORMAL HIGH (ref 3.5–5.1)
Sodium: 133 mmol/L — ABNORMAL LOW (ref 135–145)

## 2019-01-09 MED ORDER — SODIUM CHLORIDE 0.9 % IV BOLUS
1000.0000 mL | Freq: Once | INTRAVENOUS | Status: AC
  Administered 2019-01-09: 1000 mL via INTRAVENOUS

## 2019-01-09 MED ORDER — INSULIN ASPART 100 UNIT/ML ~~LOC~~ SOLN
8.0000 [IU] | Freq: Once | SUBCUTANEOUS | Status: AC
  Administered 2019-01-09: 8 [IU] via INTRAVENOUS
  Filled 2019-01-09: qty 1

## 2019-01-09 NOTE — Progress Notes (Signed)
Rhonda RN from [employer] was doing a hospital follow up and BS was 468 she called at 4:50  Requesting guidance- not comfortable changing sliding scale and blood sugar increases or decreases. He is on steroids which will cause blood sugars to be elevated. Advise nurse and patient to proceed to emergency room for evaluation- he is experiencing increase thirst and urination (shortness of breath with exertion)  no other symptoms present.   Suanne Marker [phone]

## 2019-01-09 NOTE — ED Triage Notes (Addendum)
Pt to triage via wheelchair.  Pt reports high blood sugar today.  Pt also reports urinary frequency and thirsty.  No n/v/d.  Pt was covid positive Dec 07, 2018.  Pt released from [facility] this week.  No sob or chest pain.   pt alert  Speech clear.

## 2019-01-09 NOTE — ED Provider Notes (Signed)
[facility] Emergency Department Provider Note ______   First MD Initiated Contact with Patient 01/09/19 2320     (approximate)  I have reviewed the triage vital signs and the nursing notes.   HISTORY  Chief Complaint Hyperglycemia   HPI Bryan Oconnor is a 68 y.o. male with below list of previous medical conditions including recently diagnosed COVID-19 infection on October 2 recently discharged from [facility] as well as diabetes mellitus presents to the emergency department secondary to hyperglycemia at home.  Patient states currently taking prednisone.  Patient states dyspnea is improved as well as cough.  Patient denies any fever afebrile on presentation.  Patient was currently taking Metformin glipizide and insulin at home.       Past Medical History:  Diagnosis Date  . Diabetes mellitus without complication (HCC)   . Hypertension     Patient Active Problem List   Diagnosis Date Noted  . Acute hypoxemic respiratory failure (HCC) 01/03/2019  . CKD stage 3 secondary to diabetes (HCC) 01/03/2019  . COVID-19 virus infection 12/17/2018  . Hyponatremia 12/17/2018  . Essential hypertension 12/17/2018  . Uncontrolled diabetes mellitus with hyperglycemia (HCC) 12/17/2018  . Viral pneumonia 12/17/2018  . Pneumonia due to COVID-19 virus 12/17/2018  . Acute renal failure superimposed on stage 3 chronic kidney disease (HCC) 12/16/2018    No past surgical history on file.  Prior to Admission medications   Medication Sig Start Date End Date Taking? Authorizing Provider  acetaminophen (TYLENOL) 500 MG tablet Take 500 mg by mouth every 6 (six) hours as needed for mild pain.    [provider]  aspirin EC 81 MG tablet Take 81 mg by mouth daily.    [provider]  atorvastatin (LIPITOR) 40 MG tablet Take 40 mg by mouth daily. 11/26/18   [provider]  benzonatate (TESSALON) 200 MG capsule Take 1 capsule (200 mg  total) by mouth 3 (three) times daily. 12/22/18   [name], MD  famotidine (PEPCID) 20 MG tablet Take 1 tablet (20 mg total) by mouth daily for 10 days. 12/23/18 01/02/19  [name], MD  finasteride (PROSCAR) 5 MG tablet Take 5 mg by mouth daily. 11/26/18   [provider]  gabapentin (NEURONTIN) 300 MG capsule Take 600 mg by mouth at bedtime. 12/10/18   [provider]  glipiZIDE (GLUCOTROL XL) 10 MG 24 hr tablet Take 10 mg by mouth daily. 11/26/18   [provider]  insulin aspart (NOVOLOG) 100 UNIT/ML injection Can switch to any other cheaper alternative.. Before each meal 3 times a day , 140-199 - 2 units, 200-250 - 4 units, 251-299 - 6 units,  300-349 - 8 units,  350 or above 10 units. Insulin PEN if approved, provide syringes and needles if needed. 01/07/19   [name], MD  insulin detemir (LEVEMIR) 100 UNIT/ML injection Inject 0.12 mLs (12 Units total) into the skin daily. Can switch to any long acting and same dose 01/07/19   [name], MD  metFORMIN (GLUCOPHAGE) 1000 MG tablet Take 1,000 mg by mouth 2 (two) times daily with a meal. 11/26/18   [provider]  predniSONE (DELTASONE) 5 MG tablet Label  & dispense according to the schedule below. take 8 Pills PO for 3 days, 6 Pills PO for 3 days, 4 Pills PO for 3 days, 2 Pills PO for 3 days, 1 Pills PO for 3 days, 1/2 Pill  PO for 3 days then STOP. Total  65 pills. 01/07/19   Leroy SeaSingh, Prashant K, MD    Allergies Pollen extract  No family history on file.  Social History Social History   Tobacco Use  . Smoking status: Never Smoker  . Smokeless tobacco: Never Used  Substance Use Topics  . Alcohol use: Never    Frequency: Never  . Drug use: Not on file    Review of Systems Constitutional: No fever/chills Eyes: No visual changes. ENT: No sore throat. Cardiovascular: Denies chest pain. Respiratory: Denies shortness of breath. Gastrointestinal: No abdominal pain.  No nausea, no  vomiting.  No diarrhea.  No constipation. Genitourinary: Negative for dysuria. Musculoskeletal: Negative for neck pain.  Negative for back pain. Integumentary: Negative for rash. Neurological: Negative for headaches, focal weakness or numbness.   ____________________________________________   PHYSICAL EXAM:  VITAL SIGNS: ED Triage Vitals  Enc Vitals Group     BP 01/09/19 1800 (!) 145/74     Pulse Rate 01/09/19 1800 (!) 124     Resp 01/09/19 1800 (!) 32     Temp 01/09/19 1800 97.8 F (36.6 C)     Temp Source 01/09/19 1800 Oral     SpO2 01/09/19 1800 91 %     Weight 01/09/19 1802 56.7 kg (125 lb)     Height 01/09/19 1802 1.676 m (5\' 6" )     Head Circumference --      Peak Flow --      Pain Score 01/09/19 1807 0     Pain Loc --      Pain Edu? --      Excl. in GC? --     Constitutional: Alert and oriented.  Eyes: Conjunctivae are normal.  Head: Atraumatic. Mouth/Throat: Patient is wearing a mask. Neck: No stridor.  No meningeal signs.   Cardiovascular: Normal rate, regular rhythm. Good peripheral circulation. Grossly normal heart sounds. Respiratory: Normal respiratory effort.  No retractions.  Diffuse rhonchi Gastrointestinal: Soft and nontender. No distention.   Musculoskeletal: No lower extremity tenderness nor edema. No gross deformities of extremities. Neurologic:  Normal speech and language. No gross focal neurologic deficits are appreciated.  Skin:  Skin is warm, dry and intact. Psychiatric: Mood and affect are normal. Speech and behavior are normal.  ____________________________________________   LABS (all labs ordered are listed, but only abnormal results are displayed)  Labs Reviewed  BASIC METABOLIC PANEL - Abnormal; Notable for the following components:      Result Value   Sodium 133 (*)    Potassium 6.0 (*)    Glucose, Bld 403 (*)    BUN 54 (*)    Creatinine, Ser 1.69 (*)    Calcium 8.8 (*)    GFR calc non Af Amer 41 (*)    GFR calc Af Amer 47 (*)     All other components within normal limits  CBC - Abnormal; Notable for the following components:   Hemoglobin 11.8 (*)    HCT 35.9 (*)    Platelets 420 (*)    All other components within normal limits  URINALYSIS, COMPLETE (UACMP) WITH MICROSCOPIC - Abnormal; Notable for the following components:   Color, Urine STRAW (*)    APPearance CLEAR (*)    Glucose, UA >=500 (*)    All other components within normal limits  GLUCOSE, CAPILLARY - Abnormal; Notable for the following components:   Glucose-Capillary 233 (*)    All other components within normal limits  BASIC METABOLIC PANEL - Abnormal; Notable for the following components:   Glucose, Bld  154 (*)    BUN 49 (*)    Calcium 8.1 (*)    GFR calc non Af Amer 59 (*)    All other components within normal limits  CBG MONITORING, ED   ____________________________________________  EKG  ED ECG REPORT I, Cobden N BROWN, the attending physician, personally viewed and interpreted this ECG.   Date: 01/09/2019  EKG Time: 6:06 PM  Rate: 120  Rhythm: Sinus tachycardia  Axis: Normal  Intervals: Normal  ST&T Change: None ____________________________________________  RADIOLOGY I,  N BROWN, personally viewed and evaluated these images (plain radiographs) as part of my medical decision making, as well as reviewing the written report by the radiologist.  ED MD interpretation: Chest x-ray unchanged since prior study per radiologist revealing severe patchy opacities and interstitial prominence.  Official radiology report(s): Dg Chest 1 View  Result Date: 01/09/2019 CLINICAL DATA:  Cough, hyperglycemia EXAM: CHEST  1 VIEW COMPARISON:  01/02/2019 FINDINGS: Diffuse patchy opacities and interstitial prominence throughout the lungs, stable since prior study. Heart is normal size. No visible effusions or acute bony abnormality. IMPRESSION: Severe patchy opacities and interstitial prominence, unchanged since prior study. Electronically  Signed   By: Rolm Baptise M.D.   On: 01/09/2019 23:44     Procedures   ____________________________________________   INITIAL IMPRESSION / MDM / [place] / ED COURSE  As part of my medical decision making, I reviewed the following data within the electronic MEDICAL RECORD NUMBER  68 year old male presenting to the emergency department secondary to hyperglycemia.  Patient's hyperglycemia most likely secondary to infection and prednisone use.  Patient given 2 L IV normal saline and 8 units of IV insulin with resulting glucose of 154 noted on BMP.  Patient initial BMP glucose was 403.  In addition's initial BMP revealed a potassium of 6.0 current potassium is 4.8.  Regarding patient's respiratory status he states that he feels much better since he left the hospital.  Patient is on oxygen at home with outpatient follow-up with pulmonology scheduled.  I emphasized the importance to the patient to return to the emergency department immediately if you experience any difficulty breathing.   ____________________________________________  FINAL CLINICAL IMPRESSION(S) / ED DIAGNOSES  Final diagnoses:  Hyperglycemia     MEDICATIONS GIVEN DURING THIS VISIT:  Medications  sodium chloride 0.9 % bolus 1,000 mL (0 mLs Intravenous Stopped 01/10/19 0231)  sodium chloride 0.9 % bolus 1,000 mL (0 mLs Intravenous Stopped 01/10/19 0231)  insulin aspart (novoLOG) injection 8 Units (8 Units Intravenous Given 01/09/19 2356)     ED Discharge Orders    None      *Please note:  Bryan Oconnor was evaluated in Emergency Department on 01/10/2019 for the symptoms described in the history of present illness. He was evaluated in the context of the global COVID-19 pandemic, which necessitated consideration that the patient might be at risk for infection with the SARS-CoV-2 virus that causes COVID-19. Institutional protocols and algorithms that pertain to the evaluation of patients at risk for COVID-19 are in  a state of rapid change based on information released by regulatory bodies including the CDC and federal and state organizations. These policies and algorithms were followed during the patient's care in the ED.  Some ED evaluations and interventions may be delayed as a result of limited staffing during the pandemic.*  Note:  This document was prepared using Dragon voice recognition software and may include unintentional dictation errors.   Gregor Hams, MD 01/10/19 [phone]

## 2019-01-10 LAB — BASIC METABOLIC PANEL
Anion gap: 7 (ref 5–15)
BUN: 49 mg/dL — ABNORMAL HIGH (ref 8–23)
CO2: 22 mmol/L (ref 22–32)
Calcium: 8.1 mg/dL — ABNORMAL LOW (ref 8.9–10.3)
Chloride: 109 mmol/L (ref 98–111)
Creatinine, Ser: 1.24 mg/dL (ref 0.61–1.24)
GFR calc Af Amer: >60 mL/min (ref >60)
GFR calc non Af Amer: 59 mL/min — ABNORMAL LOW (ref >60)
Glucose, Bld: 154 mg/dL — ABNORMAL HIGH (ref 70–99)
Potassium: 4.8 mmol/L (ref 3.5–5.1)
Sodium: 138 mmol/L (ref 135–145)

## 2019-01-10 LAB — GLUCOSE, CAPILLARY: Glucose-Capillary: 233 mg/dL — ABNORMAL HIGH (ref 70–99)

## 2019-01-14 ENCOUNTER — Other Ambulatory Visit: Payer: Self-pay

## 2019-01-14 NOTE — Patient Outreach (Signed)
[place] [facility]) Care Management  01/14/2019  SHERMAR FRIEDLAND [birth year] [id number]   High priority Social Work referral received from [facility] to outreach patient for financial resources. Successful outreach to patient today. Patient had back to back hospital admissions due to [place].  Patient behind on payment of utility bill due to this.   Informed patient of HOPE Program through [place] [address] that provides rent and utility assistance to individual experience financial hardship due to effects of COVID19. Informed patient to dial 211 to be connected to representative that can assist with application process. Patient reported that his son gave him an application for energy assistance with [place] [employer].  Encouraged patient to complete/submit application. Also provided patient with contact information for [facility] and [employer]. Will follow up with patient within the next two days to determine if he is eligible for assistance through any of these resources.  Ronn Melena, BSW Social Worker [phone]

## 2019-01-14 NOTE — Telephone Encounter (Signed)
Pt has follow up appt scheduled for 11/20. nothing further needed.

## 2019-01-16 ENCOUNTER — Other Ambulatory Visit: Payer: Self-pay

## 2019-01-16 NOTE — Patient Outreach (Signed)
[name]) Care Management  01/16/2019  Bryan Oconnor [birth year] [id number]   Follow up call to patient regarding social work referral for assistance with paying utility bill.   Patient stated that he has attempted to contact [employer] multiple times but has not been able to get through. Attempted to call several times after speaking to patient; rang twice then got a busy signal. Patient is submitting application to [place] today. Patient consented to referral being submitted to [facility].  Referral sent to [facility] and [place].  Will follow up with patient when response is received.   Ronn Melena, BSW Social Worker [phone]

## 2019-01-17 ENCOUNTER — Other Ambulatory Visit: Payer: Self-pay

## 2019-01-17 NOTE — Patient Outreach (Signed)
[name]) Care Management  01/17/2019  Bryan Oconnor 09/05/1950 [id number]   Follow up call to patient today.  Patient stated that he was able to get through to [facility] yesterday but is awaiting a return call.  Informed patient that [facility] may be able to assist but they need a copy of the utility bill in order to process the request.  Patient stated that he can go to [employer] or PCP office tomorrow morning and have invoice faxed.  Provided him with fax number for [name], Foundation Associate.  Will follow up with patient again tomorrow.   Ronn Melena, BSW Social Worker [phone]

## 2019-01-18 ENCOUNTER — Other Ambulatory Visit: Payer: Self-pay

## 2019-01-18 NOTE — Patient Outreach (Signed)
[place] [facility]) Care Management  01/18/2019  Bryan Oconnor 1950-08-08 [id number]  Follow up call to patient today.  He reported that his spouse and daughter were faxing copy of electricity bill to [facility] today so that request for assistance can be processed.  Messaged [name] to inform her that requested bill is being faxed today.  Requested follow up when received.  Ronn Melena, BSW Social Worker [phone]

## 2019-01-22 ENCOUNTER — Other Ambulatory Visit: Payer: Self-pay

## 2019-01-22 ENCOUNTER — Telehealth: Payer: Self-pay | Admitting: Internal Medicine

## 2019-01-22 NOTE — Patient Outreach (Signed)
[place] [facility]) Care Management  01/22/2019  Bryan Oconnor 1950/09/06 [id number]   Communicated with [name] today from [facility].  Requested utility bill was received.  She inquired about what amount of bill, if any, patient can pay.  Patient reports that he can pay $200 of bill.  [name] confirmed that the foundation will cover the remaining balance.  Informed patient of this.  Closing case but did encourage patient to call if additional needs arise.  Ronn Melena, BSW Social Worker [phone]

## 2019-01-22 NOTE — Telephone Encounter (Signed)
Pt does have a hospital follow up appt scheduled with MR 11/20 per phone note from MR 10/30   Oxygen levels have dropped down to 62% without oxygen. Asked pt if he was sent home on oxygen when he left the hospital and he said that he was. Stated to pt to make sure he is wearing the oxygen at all times and also stated to him that we would reevaluate at upcoming [place] with MR. Pt verbalized understanding. Nothing further needed.

## 2019-01-23 DIAGNOSIS — U071 COVID-19: Secondary | ICD-10-CM | POA: Diagnosis not present

## 2019-01-23 DIAGNOSIS — E1142 Type 2 diabetes mellitus with diabetic polyneuropathy: Secondary | ICD-10-CM | POA: Diagnosis not present

## 2019-01-24 ENCOUNTER — Inpatient Hospital Stay
Admission: EM | Admit: 2019-01-24 | Discharge: 2019-02-07 | DRG: 196 | Disposition: A | Discharge status: Home Health | Payer: PPO | Attending: Family Medicine | Admitting: Family Medicine

## 2019-01-24 ENCOUNTER — Emergency Department: Payer: PPO

## 2019-01-24 ENCOUNTER — Encounter: Payer: Self-pay | Admitting: Emergency Medicine

## 2019-01-24 ENCOUNTER — Other Ambulatory Visit: Payer: Self-pay

## 2019-01-24 DIAGNOSIS — J479 Bronchiectasis, uncomplicated: Secondary | ICD-10-CM

## 2019-01-24 DIAGNOSIS — Z8619 Personal history of other infectious and parasitic diseases: Secondary | ICD-10-CM | POA: Diagnosis not present

## 2019-01-24 DIAGNOSIS — R000 Tachycardia, unspecified: Secondary | ICD-10-CM

## 2019-01-24 DIAGNOSIS — Z7952 Long term (current) use of systemic steroids: Secondary | ICD-10-CM

## 2019-01-24 DIAGNOSIS — Z209 Contact with and (suspected) exposure to unspecified communicable disease: Secondary | ICD-10-CM | POA: Diagnosis not present

## 2019-01-24 DIAGNOSIS — N179 Acute kidney failure, unspecified: Secondary | ICD-10-CM | POA: Diagnosis not present

## 2019-01-24 DIAGNOSIS — Z794 Long term (current) use of insulin: Secondary | ICD-10-CM | POA: Diagnosis not present

## 2019-01-24 DIAGNOSIS — U071 COVID-19: Secondary | ICD-10-CM | POA: Diagnosis not present

## 2019-01-24 DIAGNOSIS — R0602 Shortness of breath: Secondary | ICD-10-CM | POA: Diagnosis not present

## 2019-01-24 DIAGNOSIS — I214 Non-ST elevation (NSTEMI) myocardial infarction: Secondary | ICD-10-CM | POA: Diagnosis not present

## 2019-01-24 DIAGNOSIS — J9811 Atelectasis: Secondary | ICD-10-CM | POA: Diagnosis not present

## 2019-01-24 DIAGNOSIS — I10 Essential (primary) hypertension: Secondary | ICD-10-CM | POA: Diagnosis present

## 2019-01-24 DIAGNOSIS — I471 Supraventricular tachycardia: Secondary | ICD-10-CM | POA: Diagnosis not present

## 2019-01-24 DIAGNOSIS — R778 Other specified abnormalities of plasma proteins: Secondary | ICD-10-CM | POA: Diagnosis present

## 2019-01-24 DIAGNOSIS — E1165 Type 2 diabetes mellitus with hyperglycemia: Secondary | ICD-10-CM | POA: Diagnosis not present

## 2019-01-24 DIAGNOSIS — E1122 Type 2 diabetes mellitus with diabetic chronic kidney disease: Secondary | ICD-10-CM | POA: Diagnosis present

## 2019-01-24 DIAGNOSIS — J432 Centrilobular emphysema: Secondary | ICD-10-CM | POA: Diagnosis not present

## 2019-01-24 DIAGNOSIS — N400 Enlarged prostate without lower urinary tract symptoms: Secondary | ICD-10-CM | POA: Diagnosis present

## 2019-01-24 DIAGNOSIS — F064 Anxiety disorder due to known physiological condition: Secondary | ICD-10-CM | POA: Diagnosis not present

## 2019-01-24 DIAGNOSIS — I959 Hypotension, unspecified: Secondary | ICD-10-CM | POA: Diagnosis not present

## 2019-01-24 DIAGNOSIS — J8410 Pulmonary fibrosis, unspecified: Principal | ICD-10-CM | POA: Diagnosis present

## 2019-01-24 DIAGNOSIS — E785 Hyperlipidemia, unspecified: Secondary | ICD-10-CM | POA: Diagnosis present

## 2019-01-24 DIAGNOSIS — R0689 Other abnormalities of breathing: Secondary | ICD-10-CM | POA: Diagnosis not present

## 2019-01-24 DIAGNOSIS — R0902 Hypoxemia: Secondary | ICD-10-CM

## 2019-01-24 DIAGNOSIS — Z9981 Dependence on supplemental oxygen: Secondary | ICD-10-CM

## 2019-01-24 DIAGNOSIS — Z8701 Personal history of pneumonia (recurrent): Secondary | ICD-10-CM | POA: Diagnosis not present

## 2019-01-24 DIAGNOSIS — R069 Unspecified abnormalities of breathing: Secondary | ICD-10-CM | POA: Diagnosis not present

## 2019-01-24 DIAGNOSIS — I129 Hypertensive chronic kidney disease with stage 1 through stage 4 chronic kidney disease, or unspecified chronic kidney disease: Secondary | ICD-10-CM | POA: Diagnosis not present

## 2019-01-24 DIAGNOSIS — E1169 Type 2 diabetes mellitus with other specified complication: Secondary | ICD-10-CM

## 2019-01-24 DIAGNOSIS — K449 Diaphragmatic hernia without obstruction or gangrene: Secondary | ICD-10-CM | POA: Diagnosis present

## 2019-01-24 DIAGNOSIS — N183 Chronic kidney disease, stage 3 unspecified: Secondary | ICD-10-CM | POA: Diagnosis not present

## 2019-01-24 DIAGNOSIS — I248 Other forms of acute ischemic heart disease: Secondary | ICD-10-CM | POA: Diagnosis not present

## 2019-01-24 DIAGNOSIS — J9601 Acute respiratory failure with hypoxia: Secondary | ICD-10-CM | POA: Diagnosis present

## 2019-01-24 DIAGNOSIS — K219 Gastro-esophageal reflux disease without esophagitis: Secondary | ICD-10-CM | POA: Diagnosis not present

## 2019-01-24 DIAGNOSIS — J439 Emphysema, unspecified: Secondary | ICD-10-CM

## 2019-01-24 DIAGNOSIS — T502X5A Adverse effect of carbonic-anhydrase inhibitors, benzothiadiazides and other diuretics, initial encounter: Secondary | ICD-10-CM | POA: Diagnosis present

## 2019-01-24 DIAGNOSIS — T380X5A Adverse effect of glucocorticoids and synthetic analogues, initial encounter: Secondary | ICD-10-CM | POA: Diagnosis present

## 2019-01-24 DIAGNOSIS — J9621 Acute and chronic respiratory failure with hypoxia: Secondary | ICD-10-CM | POA: Diagnosis not present

## 2019-01-24 DIAGNOSIS — R7989 Other specified abnormal findings of blood chemistry: Secondary | ICD-10-CM | POA: Diagnosis not present

## 2019-01-24 DIAGNOSIS — Z7982 Long term (current) use of aspirin: Secondary | ICD-10-CM | POA: Diagnosis not present

## 2019-01-24 DIAGNOSIS — Z79899 Other long term (current) drug therapy: Secondary | ICD-10-CM

## 2019-01-24 LAB — URINALYSIS, COMPLETE (UACMP) WITH MICROSCOPIC
Bacteria, UA: NONE SEEN
Bilirubin Urine: NEGATIVE
Glucose, UA: NEGATIVE mg/dL
Hgb urine dipstick: NEGATIVE
Ketones, ur: 20 mg/dL — AB
Leukocytes,Ua: NEGATIVE
Nitrite: NEGATIVE
Protein, ur: 100 mg/dL — AB
Specific Gravity, Urine: 1.035 — ABNORMAL HIGH (ref 1.005–1.030)
pH: 5 (ref 5.0–8.0)

## 2019-01-24 LAB — CBC WITH DIFFERENTIAL/PLATELET
Abs Immature Granulocytes: 0.03 10*3/uL (ref 0.00–0.07)
Basophils Absolute: 0 10*3/uL (ref 0.0–0.1)
Basophils Relative: 0 %
Eosinophils Absolute: 0.2 10*3/uL (ref 0.0–0.5)
Eosinophils Relative: 3 %
HCT: 34.5 % — ABNORMAL LOW (ref 39.0–52.0)
Hemoglobin: 11.1 g/dL — ABNORMAL LOW (ref 13.0–17.0)
Immature Granulocytes: 0 %
Lymphocytes Relative: 14 %
Lymphs Abs: 1.1 10*3/uL (ref 0.7–4.0)
MCH: 27.8 pg (ref 26.0–34.0)
MCHC: 32.2 g/dL (ref 30.0–36.0)
MCV: 86.5 fL (ref 80.0–100.0)
Monocytes Absolute: 0.4 10*3/uL (ref 0.1–1.0)
Monocytes Relative: 6 %
Neutro Abs: 5.9 10*3/uL (ref 1.7–7.7)
Neutrophils Relative %: 77 %
Platelets: 231 10*3/uL (ref 150–400)
RBC: 3.99 MIL/uL — ABNORMAL LOW (ref 4.22–5.81)
RDW: 14.5 % (ref 11.5–15.5)
WBC: 7.7 10*3/uL (ref 4.0–10.5)
nRBC: 0 % (ref 0.0–0.2)

## 2019-01-24 LAB — PROTIME-INR
INR: 1 (ref 0.8–1.2)
Prothrombin Time: 13.4 seconds (ref 11.4–15.2)

## 2019-01-24 LAB — FERRITIN: Ferritin: 1601 ng/mL — ABNORMAL HIGH (ref 24–336)

## 2019-01-24 LAB — SARS CORONAVIRUS 2 (TAT 6-24 HRS): SARS Coronavirus 2: POSITIVE — AB

## 2019-01-24 LAB — COMPREHENSIVE METABOLIC PANEL
ALT: 21 U/L (ref 0–44)
AST: 21 U/L (ref 15–41)
Albumin: 2.7 g/dL — ABNORMAL LOW (ref 3.5–5.0)
Alkaline Phosphatase: 54 U/L (ref 38–126)
Anion gap: 15 (ref 5–15)
BUN: 21 mg/dL (ref 8–23)
CO2: 22 mmol/L (ref 22–32)
Calcium: 8.2 mg/dL — ABNORMAL LOW (ref 8.9–10.3)
Chloride: 99 mmol/L (ref 98–111)
Creatinine, Ser: 1.21 mg/dL (ref 0.61–1.24)
GFR calc Af Amer: >60 mL/min (ref >60)
GFR calc non Af Amer: >60 mL/min (ref >60)
Glucose, Bld: 153 mg/dL — ABNORMAL HIGH (ref 70–99)
Potassium: 4 mmol/L (ref 3.5–5.1)
Sodium: 136 mmol/L (ref 135–145)
Total Bilirubin: 0.8 mg/dL (ref 0.3–1.2)
Total Protein: 7.2 g/dL (ref 6.5–8.1)

## 2019-01-24 LAB — APTT: aPTT: 33 seconds (ref 24–36)

## 2019-01-24 LAB — TROPONIN I (HIGH SENSITIVITY)
Troponin I (High Sensitivity): 262 ng/L (ref <18)
Troponin I (High Sensitivity): 135 ng/L (ref <18)

## 2019-01-24 LAB — GLUCOSE, CAPILLARY
Glucose-Capillary: 107 mg/dL — ABNORMAL HIGH (ref 70–99)
Glucose-Capillary: 217 mg/dL — ABNORMAL HIGH (ref 70–99)

## 2019-01-24 LAB — HEPARIN LEVEL (UNFRACTIONATED): Heparin Unfractionated: 0.19 IU/mL — ABNORMAL LOW (ref 0.30–0.70)

## 2019-01-24 LAB — C-REACTIVE PROTEIN: CRP: 30.8 mg/dL — ABNORMAL HIGH (ref <1.0)

## 2019-01-24 LAB — LACTIC ACID, PLASMA: Lactic Acid, Venous: 1.8 mmol/L (ref 0.5–1.9)

## 2019-01-24 LAB — PROCALCITONIN: Procalcitonin: 0.22 ng/mL

## 2019-01-24 MED ORDER — IOHEXOL 350 MG/ML SOLN
75.0000 mL | Freq: Once | INTRAVENOUS | Status: AC | PRN
  Administered 2019-01-24: 75 mL via INTRAVENOUS

## 2019-01-24 MED ORDER — ALBUTEROL SULFATE HFA 108 (90 BASE) MCG/ACT IN AERS
2.0000 | INHALATION_SPRAY | RESPIRATORY_TRACT | Status: DC | PRN
  Administered 2019-01-24 – 2019-01-25 (×4): 2 via RESPIRATORY_TRACT
  Filled 2019-01-24: qty 6.7

## 2019-01-24 MED ORDER — HEPARIN (PORCINE) 25000 UT/250ML-% IV SOLN
1200.0000 [IU]/h | INTRAVENOUS | Status: DC
  Administered 2019-01-24: 850 [IU]/h via INTRAVENOUS
  Administered 2019-01-25: 1200 [IU]/h via INTRAVENOUS
  Filled 2019-01-24 (×2): qty 250

## 2019-01-24 MED ORDER — FAMOTIDINE 20 MG PO TABS
20.0000 mg | ORAL_TABLET | Freq: Every day | ORAL | Status: DC
  Administered 2019-01-24 – 2019-02-02 (×10): 20 mg via ORAL
  Filled 2019-01-24 (×10): qty 1

## 2019-01-24 MED ORDER — HEPARIN BOLUS VIA INFUSION
3300.0000 [IU] | Freq: Once | INTRAVENOUS | Status: AC
  Administered 2019-01-24: 3300 [IU] via INTRAVENOUS
  Filled 2019-01-24: qty 3300

## 2019-01-24 MED ORDER — INSULIN ASPART 100 UNIT/ML ~~LOC~~ SOLN
0.0000 [IU] | Freq: Every day | SUBCUTANEOUS | Status: DC
  Administered 2019-01-24: 2 [IU] via SUBCUTANEOUS
  Administered 2019-01-25: 3 [IU] via SUBCUTANEOUS
  Filled 2019-01-24 (×2): qty 1

## 2019-01-24 MED ORDER — INSULIN ASPART 100 UNIT/ML ~~LOC~~ SOLN
0.0000 [IU] | Freq: Three times a day (TID) | SUBCUTANEOUS | Status: DC
  Administered 2019-01-25: 2 [IU] via SUBCUTANEOUS
  Administered 2019-01-25: 1 [IU] via SUBCUTANEOUS
  Administered 2019-01-25: 3 [IU] via SUBCUTANEOUS
  Administered 2019-01-26: 7 [IU] via SUBCUTANEOUS
  Filled 2019-01-24 (×4): qty 1

## 2019-01-24 MED ORDER — HEPARIN BOLUS VIA INFUSION
1500.0000 [IU] | Freq: Once | INTRAVENOUS | Status: AC
  Administered 2019-01-24: 1500 [IU] via INTRAVENOUS
  Filled 2019-01-24: qty 1500

## 2019-01-24 MED ORDER — SODIUM CHLORIDE 0.9 % IV BOLUS
500.0000 mL | Freq: Once | INTRAVENOUS | Status: AC
  Administered 2019-01-24: 500 mL via INTRAVENOUS

## 2019-01-24 MED ORDER — ORAL CARE MOUTH RINSE
15.0000 mL | Freq: Two times a day (BID) | OROMUCOSAL | Status: DC
  Administered 2019-01-24 – 2019-02-05 (×12): 15 mL via OROMUCOSAL

## 2019-01-24 MED ORDER — INSULIN DETEMIR 100 UNIT/ML ~~LOC~~ SOLN
12.0000 [IU] | Freq: Every day | SUBCUTANEOUS | Status: DC
  Administered 2019-01-25 – 2019-01-26 (×2): 12 [IU] via SUBCUTANEOUS
  Filled 2019-01-24 (×2): qty 0.12

## 2019-01-24 MED ORDER — GABAPENTIN 300 MG PO CAPS
600.0000 mg | ORAL_CAPSULE | Freq: Every day | ORAL | Status: DC
  Administered 2019-01-25 – 2019-02-06 (×13): 600 mg via ORAL
  Filled 2019-01-24 (×13): qty 2

## 2019-01-24 MED ORDER — ASPIRIN EC 81 MG PO TBEC
81.0000 mg | DELAYED_RELEASE_TABLET | Freq: Every day | ORAL | Status: DC
  Administered 2019-01-25 – 2019-02-07 (×14): 81 mg via ORAL
  Filled 2019-01-24 (×14): qty 1

## 2019-01-24 MED ORDER — ACETAMINOPHEN 650 MG RE SUPP: 650.0000 mg | Freq: Four times a day (QID) | RECTAL | Status: DC | PRN

## 2019-01-24 MED ORDER — ACETAMINOPHEN 325 MG PO TABS: 650.0000 mg | ORAL_TABLET | Freq: Four times a day (QID) | ORAL | Status: DC | PRN

## 2019-01-24 MED ORDER — FINASTERIDE 5 MG PO TABS
5.0000 mg | ORAL_TABLET | Freq: Every day | ORAL | Status: DC
  Administered 2019-01-24 – 2019-02-07 (×15): 5 mg via ORAL
  Filled 2019-01-24 (×15): qty 1

## 2019-01-24 MED ORDER — ASPIRIN 81 MG PO CHEW
CHEWABLE_TABLET | ORAL | Status: AC
  Filled 2019-01-24: qty 4

## 2019-01-24 MED ORDER — ASPIRIN 81 MG PO CHEW
324.0000 mg | CHEWABLE_TABLET | Freq: Once | ORAL | Status: AC
  Administered 2019-01-24: 324 mg via ORAL

## 2019-01-24 MED ORDER — ATORVASTATIN CALCIUM 20 MG PO TABS
40.0000 mg | ORAL_TABLET | Freq: Every day | ORAL | Status: DC
  Administered 2019-01-24 – 2019-02-07 (×15): 40 mg via ORAL
  Filled 2019-01-24 (×15): qty 2

## 2019-01-24 MED ORDER — ACETAMINOPHEN 500 MG PO TABS
1000.0000 mg | ORAL_TABLET | Freq: Once | ORAL | Status: AC
  Administered 2019-01-24: 1000 mg via ORAL
  Filled 2019-01-24: qty 2

## 2019-01-24 NOTE — Progress Notes (Signed)
Patient admitted to unit. Oriented to room, call bell, and staff. Bed in lowest position. Fall safety plan reviewed. Full assessment to Epic. Skin assessment verified with Amy Dalton RN. Telemetry box verification with tele clerk- Box#: [id number]. Will continue to monitor.  Patient initially short of breath and complained of mild chest pain but reported both improved as he was able to sit on the edge of the bed and catch his breath. He denies pain now and does not desire any intervention. Aware that he should remain NPO until we hear from cardiology. Bed alarm on. Agreeable to call before getting up. Belongings within reach.

## 2019-01-24 NOTE — H&P (Addendum)
History and Physical:    Bryan Oconnor   RUE:[id number] DOB: [birth year] DOA: 01/24/2019  Referring MD/provider: Dr. Paschal Dopp PCP: System, Pcp Not In   Patient coming from: Home  Chief Complaint: Shortness of breath  History of Present Illness:   Bryan Oconnor is an 68 y.o. male with medical history significant for hypertension, diabetes mellitus, CKD stage III, recent COVID-19 pneumonia with hospitalization through 12/16/2020 to 12/22/2018, hospitalized again from 01/03/2019 to 01/07/2019, chronic hypoxemic respiratory failure on 3 L/min oxygen via nasal cannula at home.  He presented to the hospital with worsening shortness of breath.  He says he has been short of breath since he was recently discharged from the hospital.  However, shortness of breath worsened over the past 2 days.  He said he had a twinge of chest pain in the left precordial area this morning but it only lasted a few seconds.  Currently he has no chest pain.  When he checked his oxygen saturation this morning it had dropped down to 68%.  No fever, chills, cough,  wheezing, vomiting, diarrhea, abdominal pain, headache, dizziness or any urinary symptoms.  ED Course: In the ED, patient was found to have acute hypoxemic respiratory failure and was requiring 6 L/min oxygen by high flow nasal cannula.  CT of the chest was done but there was no evidence of acute pulmonary embolism or any other acute cardiothoracic abnormality.  His troponin was elevated at 262 so he was started on IV heparin infusion for suspected acute NSTEMI.    ROS:   ROS all other systems reviewed were negative.  Past Medical History:   Past Medical History:  Diagnosis Date  . Diabetes mellitus without complication (HCC)   . Hypertension     Past Surgical History:   History reviewed. No pertinent surgical history.  Social History:   Social History   Socioeconomic History  . Marital status: Married    Spouse name: Not on file  .  Number of children: Not on file  . Years of education: Not on file  . Highest education level: Not on file  Occupational History  . Not on file  Social Needs  . Financial resource strain: Not on file  . Food insecurity    Worry: Not on file    Inability: Not on file  . Transportation needs    Medical: Not on file    Non-medical: Not on file  Tobacco Use  . Smoking status: Never Smoker  . Smokeless tobacco: Never Used  Substance and Sexual Activity  . Alcohol use: Never    Frequency: Never  . Drug use: Not on file  . Sexual activity: Not on file  Lifestyle  . Physical activity    Days per week: Not on file    Minutes per session: Not on file  . Stress: Not on file  Relationships  . Social Musician on phone: Not on file    Gets together: Not on file    Attends religious service: Not on file    Active member of club or organization: Not on file    Attends meetings of clubs or organizations: Not on file    Relationship status: Not on file  . Intimate partner violence    Fear of current or ex partner: Not on file    Emotionally abused: Not on file    Physically abused: Not on file    Forced sexual activity: Not on file  Other  Topics Concern  . Not on file  Social History Narrative  . Not on file    Allergies   Pollen extract  Family history:   History reviewed. No pertinent family history.  Current Medications:   Prior to Admission medications   Medication Sig Start Date End Date Taking? Authorizing Provider  acetaminophen (TYLENOL) 500 MG tablet Take 500 mg by mouth every 6 (six) hours as needed for mild pain.    [provider]  albuterol (VENTOLIN HFA) 108 (90 Base) MCG/ACT inhaler Inhale 2 puffs into the lungs every 4 (four) hours.    [provider]  aspirin EC 81 MG tablet Take 81 mg by mouth daily.    [provider]  atorvastatin (LIPITOR) 40 MG tablet Take 40 mg by mouth daily. 11/26/18   [provider]   benzonatate (TESSALON) 200 MG capsule Take 1 capsule (200 mg total) by mouth 3 (three) times daily. 12/22/18   Osvaldo ShipperKrishnan, Gokul, MD  famotidine (PEPCID) 20 MG tablet Take 1 tablet (20 mg total) by mouth daily for 10 days. 12/23/18 01/02/19  Osvaldo ShipperKrishnan, Gokul, MD  finasteride (PROSCAR) 5 MG tablet Take 5 mg by mouth daily. 11/26/18   [provider]  gabapentin (NEURONTIN) 300 MG capsule Take 600 mg by mouth at bedtime. 12/10/18   [provider]  glipiZIDE (GLUCOTROL XL) 10 MG 24 hr tablet Take 10 mg by mouth daily. 11/26/18   [provider]  insulin aspart (NOVOLOG) 100 UNIT/ML injection Can switch to any other cheaper alternative.. Before each meal 3 times a day , 140-199 - 2 units, 200-250 - 4 units, 251-299 - 6 units,  300-349 - 8 units,  350 or above 10 units. Insulin PEN if approved, provide syringes and needles if needed. 01/07/19   Leroy SeaSingh, Prashant K, MD  insulin detemir (LEVEMIR) 100 UNIT/ML injection Inject 0.12 mLs (12 Units total) into the skin daily. Can switch to any long acting and same dose 01/07/19   Leroy SeaSingh, Prashant K, MD  metFORMIN (GLUCOPHAGE) 1000 MG tablet Take 1,000 mg by mouth 2 (two) times daily with a meal. 11/26/18   [provider]  predniSONE (DELTASONE) 5 MG tablet Label  & dispense according to the schedule below. take 8 Pills PO for 3 days, 6 Pills PO for 3 days, 4 Pills PO for 3 days, 2 Pills PO for 3 days, 1 Pills PO for 3 days, 1/2 Pill  PO for 3 days then STOP. Total 65 pills. 01/07/19   Leroy SeaSingh, Prashant K, MD    Physical Exam:   Vitals:   01/24/19 0930 01/24/19 1030 01/24/19 1100 01/24/19 1130  BP: 98/68 114/74 104/78 107/86  Pulse: (!) 117 (!) 107 (!) 106 (!) 103  Resp: (!) 27 (!) 42 (!) 26 (!) 22  Temp:      TempSrc:      SpO2: 99% 100% 100% 100%  Weight:      Height:         Physical Exam: Blood pressure 107/86, pulse (!) 103, temperature 99.3 F (37.4 C), temperature source Oral, resp. rate (!) 22, height 5\' 6"  (1.676 m),  weight 56.7 kg, SpO2 100 %. Gen: No acute distress. Head: Normocephalic, atraumatic. Eyes: Pupils equal, round and reactive to light. Extraocular movements intact.  Sclerae nonicteric. No lid lag. Mouth: No acute abnormality noted Neck: Supple, no thyromegaly, no lymphadenopathy, no jugular venous distention. Chest: Adequate air entry bilaterally, no wheezing heard.  There are bibasilar fine rales.  CV: Heart sounds  are regular but tachycardic. No murmurs, rubs, clicks, or gallops.  Abdomen: Soft, nontender, nondistended with normal active bowel sounds. No hepatosplenomegaly or palpable masses. Extremities: Extremities are without clubbing, or cyanosis. No edema. Pedal pulses 2+.  Skin: Warm and dry. No rashes, lesions or wounds. Neuro: Alert and oriented times 3; grossly nonfocal.  Psych: Insight is good and judgment is appropriate. Mood and affect normal.   Data Review:    Labs: Basic Metabolic Panel: Recent Labs  Lab 01/24/19 0816  NA 136  K 4.0  CL 99  CO2 22  GLUCOSE 153*  BUN 21  CREATININE 1.21  CALCIUM 8.2*   Liver Function Tests: Recent Labs  Lab 01/24/19 0816  AST 21  ALT 21  ALKPHOS 54  BILITOT 0.8  PROT 7.2  ALBUMIN 2.7*   No results for input(s): LIPASE, AMYLASE in the last 168 hours. No results for input(s): AMMONIA in the last 168 hours. CBC: Recent Labs  Lab 01/24/19 0720  WBC 7.7  NEUTROABS 5.9  HGB 11.1*  HCT 34.5*  MCV 86.5  PLT 231   Cardiac Enzymes: No results for input(s): CKTOTAL, CKMB, CKMBINDEX, TROPONINI in the last 168 hours.  BNP (last 3 results) No results for input(s): PROBNP in the last 8760 hours. CBG: No results for input(s): GLUCAP in the last 168 hours.  Urinalysis    Component Value Date/Time   COLORURINE STRAW (A) 01/09/2019 1809   APPEARANCEUR CLEAR (A) 01/09/2019 1809   LABSPEC 1.021 01/09/2019 1809   PHURINE 5.0 01/09/2019 1809   GLUCOSEU >=500 (A) 01/09/2019 1809   HGBUR NEGATIVE 01/09/2019 1809    BILIRUBINUR NEGATIVE 01/09/2019 1809   KETONESUR NEGATIVE 01/09/2019 1809   PROTEINUR NEGATIVE 01/09/2019 1809   NITRITE NEGATIVE 01/09/2019 1809   LEUKOCYTESUR NEGATIVE 01/09/2019 1809      Radiographic Studies: Ct Angio Chest Pe W/cm &/or Wo Cm  Result Date: 01/24/2019 CLINICAL DATA:  Shortness of breath.  Recent COVID-19 pneumonia EXAM: CT ANGIOGRAPHY CHEST WITH CONTRAST TECHNIQUE: Multidetector CT imaging of the chest was performed using the standard protocol during bolus administration of intravenous contrast. Multiplanar CT image reconstructions and MIPs were obtained to evaluate the vascular anatomy. CONTRAST:  41mL OMNIPAQUE IOHEXOL 350 MG/ML SOLN COMPARISON:  Chest radiograph January 24, 2019 and chest CT January 03, 2019 FINDINGS: Cardiovascular: There is no demonstrable pulmonary embolus. There is no thoracic aortic aneurysm or dissection. Visualized great vessels appear unremarkable. Note that the right innominate and left common carotid arteries arise as a common trunk, an anatomic variant. No evident pericardial effusion or pericardial thickening. Main pulmonary artery measures 3.1 cm, borderline for pulmonary arterial hypertension. Mediastinum/Nodes: Thyroid appears unremarkable. There is a subcarinal lymph node measuring 1.7 x 1.3 cm, slightly increased in size compared to the previous study. There are scattered subcentimeter mediastinal lymph nodes elsewhere. There is a small hiatal hernia. Lungs/Pleura: There is underlying emphysematous change. There is extensive fibrosis throughout the lungs bilaterally. There are areas of associated atelectatic change. There is no frank airspace consolidation evident currently. There are no pleural effusions. There is a degree of lower lobe traction bronchiectasis. Upper Abdomen: There is mild reflux of contrast into the inferior vena cava and hepatic veins. Visualized upper abdominal structures otherwise appear unremarkable. Musculoskeletal: No  blastic or lytic bone lesions. No chest wall lesions are evident. Review of the MIP images confirms the above findings. IMPRESSION: 1. No demonstrable pulmonary embolus. No thoracic aortic aneurysm or dissection. 2. Underlying emphysematous change with extensive fibrosis throughout the  lungs bilaterally. Areas of atelectatic change noted. No well-defined airspace consolidation. A degree of residual viral pneumonitis is possible intermingled with this extensive fibrosis. There is also lower lobe bronchiectatic change bilaterally. 3. Prominent subcarinal lymph node which potentially may be due to the parenchymal lung changes. No other frank adenopathy. 4. Borderline prominent main pulmonary outflow tract, a finding that may be indicative of a degree of underlying pulmonary arterial hypertension. 5. Mild reflux of contrast into the inferior vena cava and hepatic veins, a finding that may be indicative of a degree of increased right heart pressure. 6.  Small hiatal hernia. Emphysema (ICD10-J43.9). Electronically Signed   By: Bretta Bang III M.D.   On: 01/24/2019 10:15   Dg Chest Port 1 View  Result Date: 01/24/2019 CLINICAL DATA:  Shortness of breath. COVID-19 positive last month. EXAM: PORTABLE CHEST 1 VIEW COMPARISON:  01/09/2019 FINDINGS: The cardiomediastinal silhouette is unchanged. The lungs are hypoinflated. Widespread coarse interstitial and patchy airspace opacities throughout both lungs are unchanged. No sizable pleural effusion or pneumothorax is identified. No acute osseous abnormality is seen. IMPRESSION: Unchanged extensive interstitial and patchy airspace opacities bilaterally. Electronically Signed   By: Sebastian Ache M.D.   On: 01/24/2019 08:10    EKG: Independently reviewed.  Sinus tachycardia, T wave inversions in leads III, aVF and V3   Assessment/Plan:   Principal Problem:   NSTEMI (non-ST elevated myocardial infarction) (HCC) Active Problems:   Acute respiratory failure with  hypoxemia (HCC)   Elevated troponin   Body mass index is 20.18 kg/m.   Acute on chronic hypoxemic respiratory failure: Continue oxygen via high flow nasal cannula.  Taper down oxygen as able.  Probable acute non-ST elevation MI: He had shortness of breath with brief chest pain.  Repeat troponin.  Continue IV heparin infusion and monitor PTT per protocol for now.  Continue aspirin and Lipitor.  Consulted cardiologist for further management.  2D echo on January 03, 2019 showed EF estimated at 60 to 65%, grade 1 diastolic dysfunction  Extensive pulmonary fibrosis/emphysema: Follow-up with pulmonologist as inpatient versus outpatient depending on clinical progress in the hospital.  Insulin-dependent diabetes mellitus: Continue Levemir.  Hold glipizide and Metformin.  Humalog as needed for hypoglycemia.  Hypertension: BP is soft.  No beta-blockers for now.  BPH: Continue finasteride.   Other information:   DVT prophylaxis: Lovenox ordered. Code Status: Full code. Family Communication: Plan discussed with the patient Disposition Plan: Possible discharge to home in 3 to 4 days Consults called: Cardiologist, Dr. Gwen Pounds Admission status: Inpatient   The medical decision making is of moderate complexity, therefore this is a level 2 visit.  Time spent 50 minutes  Tnya Ades [facility] Pager [phone]   How to contact the [facility] Attending or Consulting provider 7A - 7P or covering provider during after hours 7P -7A, for this patient?   1. Check the care team in [facility] and look for a) attending/consulting [facility] provider listed and b) the [facility] team listed 2. Log into [email or web address] and use [facility] universal password to access. If you do not have the password, please contact the hospital operator. 3. Locate the [facility] provider you are looking for under [facility] and page to a number that you can be directly reached. 4. If you still have difficulty reaching the  provider, please page the [facility] (Director on Call) for the Hospitalists listed on amion for assistance.  01/24/2019, 12:14 PM

## 2019-01-24 NOTE — Progress Notes (Signed)
Patient can have diet per Dr. Nehemiah Massed. No interventions today.

## 2019-01-24 NOTE — Consult Note (Signed)
[facility] Consult[facility]  Patient ID: Bryan ParadiseCharles E Presti, MRN: [id number], DOB/AGE: 68/07/1950 68 y.o. Admit date: 01/24/2019   Date of Consult: 01/24/2019 Primary Physician: System, Pcp Not In Primary Cardiologist: None  Chief Complaint:  Chief Complaint  Patient presents with  . Shortness of Breath   Reason for Consult: Shortness of breath respiratory failure with elevated troponin  HPI: 68 y.o. male with known diabetes hypertension hyperlipidemia previously on appropriate medication management for prevention measures who has had COVID-19 in October.  At that time the patient was hospitalized for approximately a week with respiratory failure.  Patient has had chronic obstructive pulmonary disease prior to this and has had some difficulty with lung disease and chronic oxygen use.  With this the patient has had chest x-rays consistent with emphysema and pulmonary fibrosis.  Recently the patient had a new onset of chest discomfort for a few seconds with severe shortness of breath progressive for the last several days.  CAT scan shows patchy infiltrate with emphysema and other fibrosis.  The patient does have chronic kidney disease stage III stable at this time with hypertension as well.  Currently there is no evidence of chest discomfort or anginal symptoms.  The patient has had an EKG showing sinus tachycardia with nonspecific ST and T wave changes.  Patient is requiring significant amount of oxygenation to keep his O2 sat elevated.  The patient also has had a troponin level of 262 most consistent with demand ischemia rather than acute coronary syndrome  Past Medical History:  Diagnosis Date  . Diabetes mellitus without complication (HCC)   . Hypertension       Surgical History: History reviewed. No pertinent surgical history.   Home Meds: Prior to Admission medications   Medication Sig Start Date End Date Taking? Authorizing Provider  aspirin EC 81 MG tablet Take 81 mg  by mouth daily.   Yes [provider]  atorvastatin (LIPITOR) 40 MG tablet Take 40 mg by mouth daily. 11/26/18  Yes [provider]  finasteride (PROSCAR) 5 MG tablet Take 5 mg by mouth daily. 11/26/18  Yes [provider]  gabapentin (NEURONTIN) 300 MG capsule Take 600 mg by mouth at bedtime. 12/10/18  Yes [provider]  glipiZIDE (GLUCOTROL XL) 10 MG 24 hr tablet Take 10 mg by mouth daily. 11/26/18  Yes [provider]  insulin aspart (NOVOLOG) 100 UNIT/ML injection Can switch to any other cheaper alternative.. Before each meal 3 times a day , 140-199 - 2 units, 200-250 - 4 units, 251-299 - 6 units,  300-349 - 8 units,  350 or above 10 units. Insulin PEN if approved, provide syringes and needles if needed. 01/07/19  Yes Leroy SeaSingh, Prashant K, MD  metFORMIN (GLUCOPHAGE) 1000 MG tablet Take 1,000 mg by mouth 2 (two) times daily with a meal. 11/26/18  Yes [provider]  acetaminophen (TYLENOL) 500 MG tablet Take 500 mg by mouth every 6 (six) hours as needed for mild pain.    [provider]  albuterol (VENTOLIN HFA) 108 (90 Base) MCG/ACT inhaler Inhale 2 puffs into the lungs every 4 (four) hours.    [provider]  benzonatate (TESSALON) 200 MG capsule Take 1 capsule (200 mg total) by mouth 3 (three) times daily. Patient not taking: Reported on 01/24/2019 12/22/18   Osvaldo ShipperKrishnan, Gokul, MD  famotidine (PEPCID) 20 MG tablet Take 1 tablet (20 mg total) by mouth daily for 10 days. 12/23/18 01/02/19  Osvaldo ShipperKrishnan, Gokul, MD  insulin detemir (  LEVEMIR) 100 UNIT/ML injection Inject 0.12 mLs (12 Units total) into the skin daily. Can switch to any long acting and same dose Patient not taking: Reported on 01/24/2019 01/07/19   Thurnell Lose, MD  predniSONE (DELTASONE) 5 MG tablet Label  & dispense according to the schedule below. take 8 Pills PO for 3 days, 6 Pills PO for 3 days, 4 Pills PO for 3 days, 2 Pills PO for 3 days, 1 Pills PO for 3 days, 1/2  Pill  PO for 3 days then STOP. Total 65 pills. Patient not taking: Reported on 01/24/2019 01/07/19   Thurnell Lose, MD    Inpatient Medications:  . albuterol  2 puff Inhalation Q4H  . aspirin EC  81 mg Oral Daily  . atorvastatin  40 mg Oral Daily  . famotidine  20 mg Oral Daily  . finasteride  5 mg Oral Daily  . gabapentin  600 mg Oral QHS  . insulin aspart  0-5 Units Subcutaneous QHS  . insulin aspart  0-9 Units Subcutaneous TID WC  . [START ON 01/25/2019] insulin detemir  12 Units Subcutaneous Daily  . mouth rinse  15 mL Mouth Rinse BID   . heparin 850 Units/hr (01/24/19 1024)    Allergies:  Allergies  Allergen Reactions  . Pollen Extract Cough    Social History   Socioeconomic History  . Marital status: Married    Spouse name: Not on file  . Number of children: Not on file  . Years of education: Not on file  . Highest education level: Not on file  Occupational History  . Not on file  Social Needs  . Financial resource strain: Not on file  . Food insecurity    Worry: Not on file    Inability: Not on file  . Transportation needs    Medical: Not on file    Non-medical: Not on file  Tobacco Use  . Smoking status: Never Smoker  . Smokeless tobacco: Never Used  Substance and Sexual Activity  . Alcohol use: Never    Frequency: Never  . Drug use: Not on file  . Sexual activity: Not on file  Lifestyle  . Physical activity    Days per week: Not on file    Minutes per session: Not on file  . Stress: Not on file  Relationships  . Social Herbalist on phone: Not on file    Gets together: Not on file    Attends religious service: Not on file    Active member of club or organization: Not on file    Attends meetings of clubs or organizations: Not on file    Relationship status: Not on file  . Intimate partner violence    Fear of current or ex partner: Not on file    Emotionally abused: Not on file    Physically abused: Not on file    Forced sexual  activity: Not on file  Other Topics Concern  . Not on file  Social History Narrative  . Not on file     History reviewed. No pertinent family history.   Review of Systems Positive for shortness of breath cough congestion Negative for: General:  chills, fever, night sweats or weight changes.  Cardiovascular: PND orthopnea syncope dizziness  Dermatological skin lesions rashes Respiratory: Positive for cough congestion Urologic: Frequent urination urination at night and hematuria Abdominal: negative for nausea, vomiting, diarrhea, bright red blood per rectum, melena, or hematemesis Neurologic: negative for visual  changes, and/or hearing changes  All other systems reviewed and are otherwise negative except as noted above.  Labs: No results for input(s): CKTOTAL, CKMB, TROPONINI in the last 72 hours. Lab Results  Component Value Date   WBC 7.7 01/24/2019   HGB 11.1 (L) 01/24/2019   HCT 34.5 (L) 01/24/2019   MCV 86.5 01/24/2019   PLT 231 01/24/2019    Recent Labs  Lab 01/24/19 0816  NA 136  K 4.0  CL 99  CO2 22  BUN 21  CREATININE 1.21  CALCIUM 8.2*  PROT 7.2  BILITOT 0.8  ALKPHOS 54  ALT 21  AST 21  GLUCOSE 153*   No results found for: CHOL, HDL, LDLCALC, TRIG Lab Results  Component Value Date   DDIMER 1.46 (H) 01/07/2019    Radiology/Studies:  Dg Chest 1 View  Result Date: 01/09/2019 CLINICAL DATA:  Cough, hyperglycemia EXAM: CHEST  1 VIEW COMPARISON:  01/02/2019 FINDINGS: Diffuse patchy opacities and interstitial prominence throughout the lungs, stable since prior study. Heart is normal size. No visible effusions or acute bony abnormality. IMPRESSION: Severe patchy opacities and interstitial prominence, unchanged since prior study. Electronically Signed   By: Charlett Nose M.D.   On: 01/09/2019 23:44   Ct Chest Wo Contrast  Result Date: 01/03/2019 CLINICAL DATA:  Inpatient.  COVID-19 pneumonia. EXAM: CT CHEST WITHOUT CONTRAST TECHNIQUE: Multidetector CT  imaging of the chest was performed following the standard protocol without IV contrast. COMPARISON:  Chest radiograph from one day prior. FINDINGS: Motion degraded scan, limiting assessment. Cardiovascular: Normal heart size. No significant pericardial effusion/thickening. Normal course and caliber of the thoracic aorta. Top-normal caliber main pulmonary artery (3.2 cm diameter). Mediastinum/Nodes: No discrete thyroid nodules. Unremarkable esophagus. No axillary adenopathy. Mildly enlarged 1.1 cm subcarinal node (series 2/image 28). No discrete hilar adenopathy on this noncontrast scan. Lungs/Pleura: No pneumothorax. No pleural effusion. Moderate centrilobular and paraseptal emphysema. There is extensive patchy confluent crazy paving opacity throughout both lungs involving all lung lobes with involvement of the peripheral, perilobular and peribronchovascular lungs, without a clear apicobasilar gradient to these findings. No discrete lung masses or significant pulmonary nodules. Suggestion of mild parenchymal distortion and mild traction bronchiectasis throughout both lungs. Upper abdomen: No acute abnormality. Musculoskeletal: No aggressive appearing focal osseous lesions. Mild thoracic spondylosis. IMPRESSION: 1. Limited motion degraded scan. Extensive patchy confluent crazy paving opacity throughout both lungs with some associated parenchymal distortion and suggestion of mild traction bronchiectasis. Findings are compatible with severe COVID-19 pneumonia with suggestion of superimposed early postinflammatory fibrosis. Consider follow-up high-resolution chest CT study in 3-6 months, as clinically warranted. 2. Mild subcarinal lymphadenopathy, nonspecific, presumably reactive, which can also be reassessed on follow-up chest CT. Emphysema (ICD10-J43.9). Electronically Signed   By: Delbert Phenix M.D.   On: 01/03/2019 15:54   Ct Angio Chest Pe W/cm &/or Wo Cm  Result Date: 01/24/2019 CLINICAL DATA:  Shortness of  breath.  Recent COVID-19 pneumonia EXAM: CT ANGIOGRAPHY CHEST WITH CONTRAST TECHNIQUE: Multidetector CT imaging of the chest was performed using the standard protocol during bolus administration of intravenous contrast. Multiplanar CT image reconstructions and MIPs were obtained to evaluate the vascular anatomy. CONTRAST:  75mL OMNIPAQUE IOHEXOL 350 MG/ML SOLN COMPARISON:  Chest radiograph January 24, 2019 and chest CT January 03, 2019 FINDINGS: Cardiovascular: There is no demonstrable pulmonary embolus. There is no thoracic aortic aneurysm or dissection. Visualized great vessels appear unremarkable. Note that the right innominate and left common carotid arteries arise as a common trunk, an anatomic variant. No  evident pericardial effusion or pericardial thickening. Main pulmonary artery measures 3.1 cm, borderline for pulmonary arterial hypertension. Mediastinum/Nodes: Thyroid appears unremarkable. There is a subcarinal lymph node measuring 1.7 x 1.3 cm, slightly increased in size compared to the previous study. There are scattered subcentimeter mediastinal lymph nodes elsewhere. There is a small hiatal hernia. Lungs/Pleura: There is underlying emphysematous change. There is extensive fibrosis throughout the lungs bilaterally. There are areas of associated atelectatic change. There is no frank airspace consolidation evident currently. There are no pleural effusions. There is a degree of lower lobe traction bronchiectasis. Upper Abdomen: There is mild reflux of contrast into the inferior vena cava and hepatic veins. Visualized upper abdominal structures otherwise appear unremarkable. Musculoskeletal: No blastic or lytic bone lesions. No chest wall lesions are evident. Review of the MIP images confirms the above findings. IMPRESSION: 1. No demonstrable pulmonary embolus. No thoracic aortic aneurysm or dissection. 2. Underlying emphysematous change with extensive fibrosis throughout the lungs bilaterally. Areas of  atelectatic change noted. No well-defined airspace consolidation. A degree of residual viral pneumonitis is possible intermingled with this extensive fibrosis. There is also lower lobe bronchiectatic change bilaterally. 3. Prominent subcarinal lymph node which potentially may be due to the parenchymal lung changes. No other frank adenopathy. 4. Borderline prominent main pulmonary outflow tract, a finding that may be indicative of a degree of underlying pulmonary arterial hypertension. 5. Mild reflux of contrast into the inferior vena cava and hepatic veins, a finding that may be indicative of a degree of increased right heart pressure. 6.  Small hiatal hernia. Emphysema (ICD10-J43.9). Electronically Signed   By: Bretta Bang III M.D.   On: 01/24/2019 10:15   Dg Chest Port 1 View  Result Date: 01/24/2019 CLINICAL DATA:  Shortness of breath. COVID-19 positive last month. EXAM: PORTABLE CHEST 1 VIEW COMPARISON:  01/09/2019 FINDINGS: The cardiomediastinal silhouette is unchanged. The lungs are hypoinflated. Widespread coarse interstitial and patchy airspace opacities throughout both lungs are unchanged. No sizable pleural effusion or pneumothorax is identified. No acute osseous abnormality is seen. IMPRESSION: Unchanged extensive interstitial and patchy airspace opacities bilaterally. Electronically Signed   By: Sebastian Ache M.D.   On: 01/24/2019 08:10   Dg Chest Portable 1 View  Result Date: 01/02/2019 CLINICAL DATA:  Short of breath EXAM: PORTABLE CHEST 1 VIEW COMPARISON:  12/17/2018 FINDINGS: Diffuse bilateral interstitial and ground-glass opacity with peripheral consolidations on the right. No pleural effusion. Normal heart size. No pneumothorax. IMPRESSION: Development of fairly extensive interstitial and ground-glass opacities, consistent with multifocal pneumonia and provided clinical history of COVID-19 positivity. Electronically Signed   By: Jasmine Pang M.D.   On: 01/02/2019 18:05   Vas [place]  Lower Extremity Venous (dvt)  Result Date: 01/04/2019  Lower Venous Study Indications: Covid with elevated d-dimer.  Comparison Study: No prior. Performing Technologist: Marilynne Halsted RDMS, RVT  Examination Guidelines: A complete evaluation includes B-mode imaging, spectral Doppler, color Doppler, and power Doppler as needed of all accessible portions of each vessel. Bilateral testing is considered an integral part of a complete examination. Limited examinations for reoccurring indications may be performed as noted.  +---------+---------------+---------+-----------+----------+--------------+ RIGHT    CompressibilityPhasicitySpontaneityPropertiesThrombus Aging +---------+---------------+---------+-----------+----------+--------------+ CFV      Full           Yes      Yes                                 +---------+---------------+---------+-----------+----------+--------------+ SFJ  Full                                                        +---------+---------------+---------+-----------+----------+--------------+ FV Prox  Full                                                        +---------+---------------+---------+-----------+----------+--------------+ FV Mid   Full                                                        +---------+---------------+---------+-----------+----------+--------------+ FV DistalFull                                                        +---------+---------------+---------+-----------+----------+--------------+ PFV      Full                                                        +---------+---------------+---------+-----------+----------+--------------+ POP      Full           Yes      Yes                                 +---------+---------------+---------+-----------+----------+--------------+ PTV      Full                                                         +---------+---------------+---------+-----------+----------+--------------+ PERO     Full                                                        +---------+---------------+---------+-----------+----------+--------------+   +---------+---------------+---------+-----------+----------+--------------+ LEFT     CompressibilityPhasicitySpontaneityPropertiesThrombus Aging +---------+---------------+---------+-----------+----------+--------------+ CFV      Full           Yes      Yes                                 +---------+---------------+---------+-----------+----------+--------------+ SFJ      Full                                                        +---------+---------------+---------+-----------+----------+--------------+  FV Prox  Full                                                        +---------+---------------+---------+-----------+----------+--------------+ FV Mid   Full                                                        +---------+---------------+---------+-----------+----------+--------------+ FV DistalFull                                                        +---------+---------------+---------+-----------+----------+--------------+ PFV      Full                                                        +---------+---------------+---------+-----------+----------+--------------+ POP      Full           Yes      Yes                                 +---------+---------------+---------+-----------+----------+--------------+ PTV      Full                                                        +---------+---------------+---------+-----------+----------+--------------+ PERO     Full                                                        +---------+---------------+---------+-----------+----------+--------------+     Summary: Right: There is no evidence of deep vein thrombosis in the lower extremity. No cystic structure found in the  popliteal fossa. Left: There is no evidence of deep vein thrombosis in the lower extremity. No cystic structure found in the popliteal fossa.  *See table(s) above for measurements and observations. Electronically signed by Waverly Ferrari MD on 01/04/2019 at 2:19:24 PM.    Final     EKG: Sinus tachycardia with nonspecific ST and T wave changes  Weights: Filed Weights   01/24/19 0713 01/24/19 1505  Weight: 56.7 kg 60.3 kg     Physical Exam: Blood pressure 116/88, pulse 99, temperature 98.1 F (36.7 C), temperature source Oral, resp. rate 20, height 5\' 6"  (1.676 m), weight 60.3 kg, SpO2 100 %. Body mass index is 21.46 kg/m. General: Well developed, well nourished, in no acute distress. Head eyes ears nose throat: Normocephalic, atraumatic, sclera non-icteric, no xanthomas, nares are without discharge. No apparent thyromegaly and/or mass  Lungs: Normal respiratory effort.  Diffuse  wheezes, no rales, few rhonchi.  Heart: RRR with normal S1 S2. no murmur gallop, no rub, PMI is normal size and placement, carotid upstroke normal without bruit, jugular venous pressure is normal Abdomen: Soft, non-tender, non-distended with normoactive bowel sounds. No hepatomegaly. No rebound/guarding. No obvious abdominal masses. Abdominal aorta is normal size without bruit Extremities: Trace edema. no cyanosis, no clubbing, no ulcers  Peripheral : 2+ bilateral upper extremity pulses, 2+ bilateral femoral pulses, 2+ bilateral dorsal pedal pulse Neuro: Alert and oriented. No facial asymmetry. No focal deficit. Moves all extremities spontaneously. Musculoskeletal: Normal muscle tone without kyphosis Psych:  Responds to questions appropriately with a normal affect.    Assessment: 68 year old male with chronic obstructive pulmonary disease with acute respiratory failure with hypoxia diabetes hypertension hyperlipidemia and sinus tachycardia with elevated troponin consistent with demand ischemia rather than  acute coronary syndrome  Plan: 1.  Continue current supportive care of hypoxia and acute hypoxic respiratory failure 2.  Follow closely for need for hypertension control although currently blood pressure stable and would potentially avoid beta-blocker due to concerns of emphysema COPD and hypoxia 3.  Okay for aspirin use at this time but no need for heparin full dose due to no evidence of myocardial infarction due to troponin level most consistent with demand ischemia 4.  High intensity cholesterol therapy 5.  No further cardiac diagnostics necessary at this time and would begin ambulation and follow for improvements of symptoms  Signed, Lamar Blinks M.D. [facility] Cardiology 01/24/2019, 4:04 PM

## 2019-01-24 NOTE — ED Notes (Signed)
Date and time results received: 01/24/19 0851 (use smartphrase ".now" to insert current time)  Test: trop Critical Value: 262  Name of Provider Notified: Monks  Orders Received? Or Actions Taken?: new EKG, asa, hep bolus

## 2019-01-24 NOTE — Consult Note (Signed)
ANTICOAGULATION CONSULT NOTE - Initial Consult  Pharmacy Consult for Heparin Indication: chest pain/ACS and possible pulmonary embolus awaiting CT results.   Allergies  Allergen Reactions  . Pollen Extract Cough    Patient Measurements: Height: 5\' 6"  (167.6 cm) Weight: 125 lb (56.7 kg) IBW/kg (Calculated) : 63.8 Heparin Dosing Weight: 56.7  Vital Signs: Temp: 99.3 F (37.4 C) (11/19 0710) Temp Source: Oral (11/19 0710) BP: 134/81 (11/19 0830) Pulse Rate: 125 (11/19 0800)  Labs: Recent Labs    01/24/19 0720 01/24/19 0816  HGB 11.1*  --   HCT 34.5*  --   PLT 231  --   LABPROT 13.4  --   INR 1.0  --   CREATININE  --  1.21  TROPONINIHS  --  262*    Estimated Creatinine Clearance: 46.9 mL/min (by C-G formula based on SCr of 1.21 mg/dL).   Medical History: Past Medical History:  Diagnosis Date  . Diabetes mellitus without complication ([place])   . Hypertension     Medications:  (Not in a hospital admission)  Scheduled:  Infusions:  PRN:  Anti-infectives (From admission, onward)   None      Assessment: Pharmacy consulted to start heparin for NSTEMI and possible PE - awaiting CT results. No prior DOAC noted. CBC stable. No notes of bleeding in chart.   Goal of Therapy:  Heparin level 0.3-0.7 units/ml Monitor platelets by anticoagulation protocol: Yes   Plan:  Give 3300 units bolus x 1 Start heparin infusion at 850 units/hr Check anti-Xa level in 6 hours and daily while on heparin Continue to monitor H&H and platelets  Quinnetta Roepke S Khylee Algeo 01/24/2019,9:05 AM

## 2019-01-24 NOTE — ED Triage Notes (Signed)
Pt from home, called out to [facility] for SOB. Pt was dx with COVID on Oct 2nd and since has been placed on [facility]. Pt has been SOB x1week with no productive cough. Pt st WOB is worse at night. Pt was 65% O2 on 2LNC at home when EMS arived.  EMS VS  BP: 146/86 then 117/84  P 130 ST Co2 22 RR: 35-45 T: 98.6 Cbg: 244

## 2019-01-24 NOTE — Consult Note (Signed)
[place] for Heparin Indication: chest pain/ACS  awaiting CT results.   Patient Measurements: Height: 5\' 6"  (167.6 cm) Weight: 125 lb (56.7 kg) IBW/kg (Calculated) : 63.8  Vital Signs: Temp: 99.3 F (37.4 C) (11/19 0710) Temp Source: Oral (11/19 0710) BP: 104/75 (11/19 1300) Pulse Rate: 99 (11/19 1300)  Labs: Recent Labs    01/24/19 0720 01/24/19 0816  HGB 11.1*  --   HCT 34.5*  --   PLT 231  --   APTT 33  --   LABPROT 13.4  --   INR 1.0  --   CREATININE  --  1.21  TROPONINIHS  --  262*    Estimated Creatinine Clearance: 46.9 mL/min (by C-G formula based on SCr of 1.21 mg/dL).   Medical History: Past Medical History:  Diagnosis Date  . Diabetes mellitus without complication ([place])   . Hypertension     Medications:  (Not in a hospital admission)  Scheduled:  Infusions:  PRN:  Anti-infectives (From admission, onward)   None      Assessment: Pharmacy consulted to start heparin for a probable acute non-ST elevation MI. CT reveals no PE. No prior DOAC noted. CBC stable. No notes of bleeding in chart. Baseline labs: INR 1.0, aPTT 33s, Hgb 11.1, PLT 231  Heparin Course: 11/19 am initiation: 3300 unit bolus, then 850 units/hr 11/19 1558 HL 0.19:   Goal of Therapy:  Heparin level 0.3-0.7 units/ml Monitor platelets by anticoagulation protocol: Yes   Plan:   Give 3300 units bolus x 1  Increase heparin infusion to 1050 units/hr  Check anti-Xa level 6 hours after rate change and daily while on heparin  Continue to monitor H&H and platelets  Dallie Piles, PharmD 01/24/2019,1:58 PM

## 2019-01-24 NOTE — ED Provider Notes (Signed)
[facility] Emergency Department Provider Note  ____________________________________________   First MD Initiated Contact with Patient 01/24/19 [phone]     (approximate)  I have reviewed the triage vital signs and the nursing notes.  History  Chief Complaint Shortness of Breath    HPI Bryan Oconnor is a 68 y.o. male with hx of HTN, DM, CKD, recent COVID diagnosis requiring admission (diagnosed 10/5, admitted 10/12 to 10/17), with resultant post-COVID lung fibrosis (readmitted 10/29 to 11/2) now on 2 L [place] who presents to the emergency department for worsening shortness of breath. He reports since his discharge he was feeling improved, however he reports developing shortness of breath approximately 1 week ago, worsening over the last several days.  He is short of breath even at rest and especially with exertion. He denies any chest pain currently, but does state that he has had intermittent episodes of chest discomfort.  On EMS arrival to his home he was reportedly 65% O2 on his 2L [place].  On 6L in the emergency department he is satting low to mid 90s.  He does report intermittent subjective fevers/chills over the last few days.  He denies any cough.  No shortness of breath, leg swelling, or history of heart failure.   Past Medical Hx Past Medical History:  Diagnosis Date  . Diabetes mellitus without complication (HCC)   . Hypertension     Problem List Patient Active Problem List   Diagnosis Date Noted  . Acute hypoxemic respiratory failure (HCC) 01/03/2019  . CKD stage 3 secondary to diabetes (HCC) 01/03/2019  . COVID-19 virus infection 12/17/2018  . Hyponatremia 12/17/2018  . Essential hypertension 12/17/2018  . Uncontrolled diabetes mellitus with hyperglycemia (HCC) 12/17/2018  . Viral pneumonia 12/17/2018  . Pneumonia due to COVID-19 virus 12/17/2018  . Acute renal failure superimposed on stage 3 chronic kidney disease (HCC) 12/16/2018    Past  Surgical Hx History reviewed. No pertinent surgical history.  Medications Prior to Admission medications   Medication Sig Start Date End Date Taking? Authorizing Provider  acetaminophen (TYLENOL) 500 MG tablet Take 500 mg by mouth every 6 (six) hours as needed for mild pain.    [provider]  aspirin EC 81 MG tablet Take 81 mg by mouth daily.    [provider]  atorvastatin (LIPITOR) 40 MG tablet Take 40 mg by mouth daily. 11/26/18   [provider]  benzonatate (TESSALON) 200 MG capsule Take 1 capsule (200 mg total) by mouth 3 (three) times daily. 12/22/18   Osvaldo ShipperKrishnan, Gokul, MD  famotidine (PEPCID) 20 MG tablet Take 1 tablet (20 mg total) by mouth daily for 10 days. 12/23/18 01/02/19  Osvaldo ShipperKrishnan, Gokul, MD  finasteride (PROSCAR) 5 MG tablet Take 5 mg by mouth daily. 11/26/18   [provider]  gabapentin (NEURONTIN) 300 MG capsule Take 600 mg by mouth at bedtime. 12/10/18   [provider]  glipiZIDE (GLUCOTROL XL) 10 MG 24 hr tablet Take 10 mg by mouth daily. 11/26/18   [provider]  insulin aspart (NOVOLOG) 100 UNIT/ML injection Can switch to any other cheaper alternative.. Before each meal 3 times a day , 140-199 - 2 units, 200-250 - 4 units, 251-299 - 6 units,  300-349 - 8 units,  350 or above 10 units. Insulin PEN if approved, provide syringes and needles if needed. 01/07/19   Leroy SeaSingh, Prashant K, MD  insulin detemir (LEVEMIR) 100 UNIT/ML injection Inject 0.12 mLs (12 Units total) into the skin daily. Can  switch to any long acting and same dose 01/07/19   Thurnell Lose, MD  metFORMIN (GLUCOPHAGE) 1000 MG tablet Take 1,000 mg by mouth 2 (two) times daily with a meal. 11/26/18   [provider]  predniSONE (DELTASONE) 5 MG tablet Label  & dispense according to the schedule below. take 8 Pills PO for 3 days, 6 Pills PO for 3 days, 4 Pills PO for 3 days, 2 Pills PO for 3 days, 1 Pills PO for 3 days, 1/2 Pill  PO for 3 days then STOP.  Total 65 pills. 01/07/19   Thurnell Lose, MD    Allergies Pollen extract  Family Hx History reviewed. No pertinent family history.  Social Hx Social History   Tobacco Use  . Smoking status: Never Smoker  . Smokeless tobacco: Never Used  Substance Use Topics  . Alcohol use: Never    Frequency: Never  . Drug use: Not on file     Review of Systems  Constitutional: Negative for fever, chills. Eyes: Negative for visual changes. ENT: Negative for sore throat. Cardiovascular: Negative for chest pain. Respiratory: + for shortness of breath. Gastrointestinal: Negative for nausea, vomiting.  Genitourinary: Negative for dysuria. Musculoskeletal: Negative for leg swelling. Skin: Negative for rash. Neurological: Negative for for headaches.   Physical Exam  Vital Signs: ED Triage Vitals  Enc Vitals Group     BP --      Pulse Rate 01/24/19 0710 (!) 131     Resp 01/24/19 0710 (!) 27     Temp 01/24/19 0710 99.3 F (37.4 C)     Temp Source 01/24/19 0710 Oral     SpO2 01/24/19 0708 (S) (!) 85 %     Weight 01/24/19 0713 125 lb (56.7 kg)     Height 01/24/19 0713 5\' 6"  (1.676 m)     Head Circumference --      Peak Flow --      Pain Score 01/24/19 0713 0     Pain Loc --      Pain Edu? --      Excl. in [place]? --     Constitutional: Alert and oriented. Appears SOB.  Head: Normocephalic. Atraumatic. Eyes: Conjunctivae clear. Sclera anicteric. Nose: No congestion. No rhinorrhea. Mouth/Throat: MM dry.   Neck: No stridor.   Cardiovascular: Tachycardic, regular rhythm. Extremities well perfused. Respiratory: Tachypneic to 30s with accessory muscle use. Appears SOB at rest. On 6 L [place] satting low to mid 90s.  Gastrointestinal: Soft. Non-tender. Non-distended.  Musculoskeletal: No lower extremity edema. No deformities. Neurologic:  Normal speech and language. No gross focal neurologic deficits are appreciated.  Skin: Skin is warm, dry and intact. No rash noted. Psychiatric: Mood  and affect are appropriate for situation.  EKG  Personally reviewed.  7:05 AM.  Rate: 136 Rhythm: sinus Axis: LAD Intervals: borderline prolonged QT S1Q3T3 - appears new compared to prior Sinus tachycardia No STEMI  Repeat - 8:56 AM Rate: 124 Rhythm: sinus Axis: LAD Intervals: borderline QT S1Q3T3 No STEMI   Radiology  XR: IMPRESSION:  Unchanged extensive interstitial and patchy airspace opacities  bilaterally.   CT: IMPRESSION: 1. No demonstrable pulmonary embolus. No thoracic aortic aneurysm or dissection. 2. Underlying emphysematous change with extensive fibrosis throughout the lungs bilaterally. Areas of atelectatic change noted. No well-defined airspace consolidation. A degree of residual viral pneumonitis is possible intermingled with this extensive fibrosis. There is also lower lobe bronchiectatic change bilaterally. 3. Prominent subcarinal lymph node which potentially may be due to  the parenchymal lung changes. No other frank adenopathy. 4. Borderline prominent main pulmonary outflow tract, a finding that may be indicative of a degree of underlying pulmonary arterial hypertension. 5. Mild reflux of contrast into the inferior vena cava and hepatic veins, a finding that may be indicative of a degree of increased right heart pressure. 6. Small hiatal hernia.   Procedures  Procedure(s) performed (including critical care):  .Critical Care Performed by: Miguel Aschoff., MD Authorized by: Miguel Aschoff., MD   Critical care provider statement:    Critical care time (minutes):  45   Critical care was necessary to treat or prevent imminent or life-threatening deterioration of the following conditions: respiratory failure/hypoxia 2/2 pulm fibrosis, NSTEMI.   Critical care was time spent personally by me on the following activities:  Discussions with consultants, evaluation of patient's response to treatment, examination of patient, ordering and performing  treatments and interventions, ordering and review of laboratory studies, ordering and review of radiographic studies, pulse oximetry, re-evaluation of patient's condition, obtaining history from patient or surrogate and review of old charts     Initial Impression / Assessment and Plan / ED Course  68 y.o. male with hx of HTN, DM, CKD, recent COVID diagnosis requiring admission (diagnosed 10/5, admitted 10/12 to 10/17), with resultant post-COVID lung fibrosis (readmitted 10/29 to 11/2) now on 2 L [place] who presents to the emergency department for worsening shortness of breath.  Ddx: worsening fibrosis, superimposed infection, PE  Will obtain labs, imaging, supplemental oxygen (will place on HFNC for work of breathing)  Troponin elevated at 262, heparin ordered. Repeat EKG does not reveal STEMI, though notable for S1Q3T3.  Cr WNL on CMP, will obtain CT PE.   CT is negative for PE.  Notable for extensive fibrosis, possible underlying pulmonary arterial hypertension.  Patient's work of breathing is notably improved with high flow, on 6 L, ~44% FiO2.  Will admit.  Patient updated on plan of care.   Final Clinical Impression(s) / ED Diagnosis  Final diagnoses:  Shortness of breath  Hypoxia  NSTEMI (non-ST elevated myocardial infarction) [facility])       Note:  This document was prepared using Dragon voice recognition software and may include unintentional dictation errors.   Miguel Aschoff., MD 01/24/19 1034

## 2019-01-24 NOTE — ED Notes (Signed)
Patient transported to CT at this time.  Will continue to monitor. 

## 2019-01-25 ENCOUNTER — Inpatient Hospital Stay: Payer: PPO | Admitting: Internal Medicine

## 2019-01-25 DIAGNOSIS — J8410 Pulmonary fibrosis, unspecified: Principal | ICD-10-CM

## 2019-01-25 DIAGNOSIS — J9621 Acute and chronic respiratory failure with hypoxia: Secondary | ICD-10-CM

## 2019-01-25 DIAGNOSIS — J439 Emphysema, unspecified: Secondary | ICD-10-CM

## 2019-01-25 DIAGNOSIS — U071 COVID-19: Secondary | ICD-10-CM

## 2019-01-25 LAB — CBC
HCT: 32.6 % — ABNORMAL LOW (ref 39.0–52.0)
Hemoglobin: 10.5 g/dL — ABNORMAL LOW (ref 13.0–17.0)
MCH: 27.4 pg (ref 26.0–34.0)
MCHC: 32.2 g/dL (ref 30.0–36.0)
MCV: 85.1 fL (ref 80.0–100.0)
Platelets: 242 10*3/uL (ref 150–400)
RBC: 3.83 MIL/uL — ABNORMAL LOW (ref 4.22–5.81)
RDW: 14.4 % (ref 11.5–15.5)
WBC: 5.1 10*3/uL (ref 4.0–10.5)
nRBC: 0 % (ref 0.0–0.2)

## 2019-01-25 LAB — HEPARIN LEVEL (UNFRACTIONATED)
Heparin Unfractionated: 0.14 IU/mL — ABNORMAL LOW (ref 0.30–0.70)
Heparin Unfractionated: 0.38 IU/mL (ref 0.30–0.70)

## 2019-01-25 LAB — BASIC METABOLIC PANEL
Anion gap: 13 (ref 5–15)
BUN: 19 mg/dL (ref 8–23)
CO2: 23 mmol/L (ref 22–32)
Calcium: 8.1 mg/dL — ABNORMAL LOW (ref 8.9–10.3)
Chloride: 99 mmol/L (ref 98–111)
Creatinine, Ser: 1.16 mg/dL (ref 0.61–1.24)
GFR calc Af Amer: >60 mL/min (ref >60)
GFR calc non Af Amer: >60 mL/min (ref >60)
Glucose, Bld: 161 mg/dL — ABNORMAL HIGH (ref 70–99)
Potassium: 4.3 mmol/L (ref 3.5–5.1)
Sodium: 135 mmol/L (ref 135–145)

## 2019-01-25 LAB — GLUCOSE, CAPILLARY
Glucose-Capillary: 216 mg/dL — ABNORMAL HIGH (ref 70–99)
Glucose-Capillary: 178 mg/dL — ABNORMAL HIGH (ref 70–99)
Glucose-Capillary: 122 mg/dL — ABNORMAL HIGH (ref 70–99)
Glucose-Capillary: 256 mg/dL — ABNORMAL HIGH (ref 70–99)

## 2019-01-25 MED ORDER — SULFAMETHOXAZOLE-TRIMETHOPRIM 400-80 MG PO TABS
1.0000 | ORAL_TABLET | Freq: Two times a day (BID) | ORAL | Status: DC
  Administered 2019-01-25 – 2019-01-29 (×8): 1 via ORAL
  Filled 2019-01-25 (×9): qty 1

## 2019-01-25 MED ORDER — ADULT MULTIVITAMIN W/MINERALS CH
1.0000 | ORAL_TABLET | Freq: Every day | ORAL | Status: DC
  Administered 2019-01-26 – 2019-02-07 (×13): 1 via ORAL
  Filled 2019-01-25 (×13): qty 1

## 2019-01-25 MED ORDER — DEXAMETHASONE 4 MG PO TABS
6.0000 mg | ORAL_TABLET | Freq: Every day | ORAL | Status: DC
  Administered 2019-01-25 – 2019-02-07 (×14): 6 mg via ORAL
  Filled 2019-01-25 (×14): qty 1.5

## 2019-01-25 MED ORDER — MIDODRINE HCL 5 MG PO TABS
5.0000 mg | ORAL_TABLET | Freq: Three times a day (TID) | ORAL | Status: DC
  Administered 2019-01-25 – 2019-02-07 (×39): 5 mg via ORAL
  Filled 2019-01-25 (×39): qty 1

## 2019-01-25 MED ORDER — NEPRO/CARBSTEADY PO LIQD
237.0000 mL | Freq: Three times a day (TID) | ORAL | Status: DC
  Administered 2019-01-25 – 2019-02-07 (×30): 237 mL via ORAL

## 2019-01-25 MED ORDER — FUROSEMIDE 10 MG/ML IJ SOLN
20.0000 mg | Freq: Every day | INTRAMUSCULAR | Status: DC
  Administered 2019-01-25 – 2019-01-26 (×2): 20 mg via INTRAVENOUS
  Filled 2019-01-25 (×2): qty 2

## 2019-01-25 MED ORDER — DILTIAZEM HCL 25 MG/5ML IV SOLN
10.0000 mg | INTRAVENOUS | Status: DC | PRN
  Administered 2019-01-25: 10 mg via INTRAVENOUS
  Filled 2019-01-25: qty 5

## 2019-01-25 MED ORDER — HEPARIN BOLUS VIA INFUSION
1800.0000 [IU] | Freq: Once | INTRAVENOUS | Status: AC
  Administered 2019-01-25: 1800 [IU] via INTRAVENOUS
  Filled 2019-01-25: qty 1800

## 2019-01-25 MED ORDER — ENOXAPARIN SODIUM 40 MG/0.4ML ~~LOC~~ SOLN
40.0000 mg | SUBCUTANEOUS | Status: DC
  Administered 2019-01-25 – 2019-02-06 (×13): 40 mg via SUBCUTANEOUS
  Filled 2019-01-25 (×13): qty 0.4

## 2019-01-25 NOTE — Consult Note (Signed)
[place] for Heparin Indication: chest pain/ACS  awaiting CT results.   Patient Measurements: Height: 5\' 6"  (167.6 cm) Weight: 132 lb 15 oz (60.3 kg) IBW/kg (Calculated) : 63.8  Vital Signs: Temp: 98.8 F (37.1 C) (11/19 2020) Temp Source: Oral (11/19 2020) BP: 122/90 (11/19 2020) Pulse Rate: 120 (11/19 2020)  Labs: Recent Labs    01/24/19 0720 01/24/19 0816 01/24/19 1558 01/25/19 0047  HGB 11.1*  --   --  10.5*  HCT 34.5*  --   --  32.6*  PLT 231  --   --  242  APTT 33  --   --   --   LABPROT 13.4  --   --   --   INR 1.0  --   --   --   HEPARINUNFRC  --   --  0.19* 0.14*  CREATININE  --  1.21  --  1.16  TROPONINIHS  --  262* 135*  --     Estimated Creatinine Clearance: 52 mL/min (by C-G formula based on SCr of 1.16 mg/dL).   Medical History: Past Medical History:  Diagnosis Date  . Diabetes mellitus without complication ([place])   . Hypertension     Medications:  Medications Prior to Admission  Medication Sig Dispense Refill Last Dose  . aspirin EC 81 MG tablet Take 81 mg by mouth daily.   01/23/2019 at 0800  . atorvastatin (LIPITOR) 40 MG tablet Take 40 mg by mouth daily.   01/23/2019 at 0800  . finasteride (PROSCAR) 5 MG tablet Take 5 mg by mouth daily.   01/23/2019 at 0800  . gabapentin (NEURONTIN) 300 MG capsule Take 600 mg by mouth at bedtime.   01/23/2019 at 2000  . glipiZIDE (GLUCOTROL XL) 10 MG 24 hr tablet Take 10 mg by mouth daily.   01/23/2019 at 0800  . insulin aspart (NOVOLOG) 100 UNIT/ML injection Can switch to any other cheaper alternative.. Before each meal 3 times a day , 140-199 - 2 units, 200-250 - 4 units, 251-299 - 6 units,  300-349 - 8 units,  350 or above 10 units. Insulin PEN if approved, provide syringes and needles if needed. 10 mL 0 01/23/2019 at 1800  . metFORMIN (GLUCOPHAGE) 1000 MG tablet Take 1,000 mg by mouth 2 (two) times daily with a meal.   01/23/2019 at 2000  . acetaminophen (TYLENOL) 500 MG  tablet Take 500 mg by mouth every 6 (six) hours as needed for mild pain.   prn at prn  . albuterol (VENTOLIN HFA) 108 (90 Base) MCG/ACT inhaler Inhale 2 puffs into the lungs every 4 (four) hours.   prn at prn  . benzonatate (TESSALON) 200 MG capsule Take 1 capsule (200 mg total) by mouth 3 (three) times daily. (Patient not taking: Reported on 01/24/2019) 30 capsule 0 Completed Course at Unknown time  . famotidine (PEPCID) 20 MG tablet Take 1 tablet (20 mg total) by mouth daily for 10 days. 10 tablet 0   . insulin detemir (LEVEMIR) 100 UNIT/ML injection Inject 0.12 mLs (12 Units total) into the skin daily. Can switch to any long acting and same dose (Patient not taking: Reported on 01/24/2019) 10 mL 0 Not Taking at Unknown time  . predniSONE (DELTASONE) 5 MG tablet Label  & dispense according to the schedule below. take 8 Pills PO for 3 days, 6 Pills PO for 3 days, 4 Pills PO for 3 days, 2 Pills PO for 3 days, 1 Pills PO for 3  days, 1/2 Pill  PO for 3 days then STOP. Total 65 pills. (Patient not taking: Reported on 01/24/2019) 65 tablet 0 Completed Course at Unknown time   Scheduled:  Infusions:  PRN:  Anti-infectives (From admission, onward)   None      Assessment: Pharmacy consulted to start heparin for a probable acute non-ST elevation MI. CT reveals no PE. No prior DOAC noted. CBC stable. No notes of bleeding in chart. Baseline labs: INR 1.0, aPTT 33s, Hgb 11.1, PLT 231  Heparin Course: 11/19 am initiation: 3300 unit bolus, then 850 units/hr 11/19 1558 HL 0.19:   Goal of Therapy:  Heparin level 0.3-0.7 units/ml Monitor platelets by anticoagulation protocol: Yes   Plan:  11/20 @ 0200 HL 0.14 subtherapeutic. Confirmed w/ RN drip had not been stopped, Will rebolus heparin 1800 units IV x 1 and increase rate to 1200 units/hr and will recheck HL @ 1000, CBC trending down slightly will continue to monitor.  Thomasene Ripple, PharmD 01/25/2019,2:13 AM

## 2019-01-25 NOTE — Plan of Care (Signed)
  Problem: Education: Goal: Knowledge of General Education information will improve Description: Including pain rating scale, medication(s)/side effects and non-pharmacologic comfort measures Outcome: Progressing   Problem: Health Behavior/Discharge Planning: Goal: Ability to manage health-related needs will improve Outcome: Progressing   Problem: Clinical Measurements: Goal: Ability to maintain clinical measurements within normal limits will improve Outcome: Progressing Goal: Will remain free from infection Outcome: Progressing Goal: Diagnostic test results will improve Outcome: Progressing Goal: Respiratory complications will improve Outcome: Not Progressing Note: Continues to experience dyspnea upon exertion.  SpO2 86% on 6L/min HFNC; increased to 7L/min with SpO2 89%. Goal: Cardiovascular complication will be avoided Outcome: Progressing   Problem: Activity: Goal: Risk for activity intolerance will decrease Outcome: Not Progressing Note: Continues to experience dyspnea upon exertion.  SpO2 86% on 6L/min HFNC; increased to 7L/min with SpO2 89%.   Problem: Nutrition: Goal: Adequate nutrition will be maintained Outcome: Progressing   Problem: Coping: Goal: Level of anxiety will decrease Outcome: Progressing   Problem: Elimination: Goal: Will not experience complications related to bowel motility Outcome: Progressing Goal: Will not experience complications related to urinary retention Outcome: Progressing   Problem: Pain Managment: Goal: General experience of comfort will improve Outcome: Progressing   Problem: Safety: Goal: Ability to remain free from injury will improve Outcome: Progressing   Problem: Skin Integrity: Goal: Risk for impaired skin integrity will decrease Outcome: Progressing   Problem: Education: Goal: Understanding of cardiac disease, CV risk reduction, and recovery process will improve Outcome: Progressing Goal: Individualized Educational  Video(s) Outcome: Progressing   Problem: Activity: Goal: Ability to tolerate increased activity will improve Outcome: Progressing   Problem: Cardiac: Goal: Ability to achieve and maintain adequate cardiovascular perfusion will improve Outcome: Progressing   Problem: Health Behavior/Discharge Planning: Goal: Ability to safely manage health-related needs after discharge will improve Outcome: Progressing

## 2019-01-25 NOTE — Progress Notes (Signed)
PROGRESS NOTE    Bryan Oconnor  VOH:[id number] DOB: [birth year] DOA: 01/24/2019  PCP: System, Pcp Not In    LOS - 1   Brief Narrative:  68 y.o. male with medical history significant for emphysema, hypertension, diabetes mellitus, CKD stage III, recent COVID-19 pneumonia with hospitalization through 12/16/2020 to 12/22/2018, hospitalized again from 01/03/2019 to 01/07/2019, chronic hypoxemic respiratory failure on 3 L/min oxygen via nasal cannula at home.  He presented to the hospital with worsening shortness of breath past 2 days as he neared the end of steroid taper, but had been ongoing since he was discharged from the hospital on 3L/min oxygen. Also reported a single episode of left-sided chest pain earlier that morning.  Patient checked O2 sat at home, it was 68%, prompting him to come to ED.  In the ED, patient requiring 6 L/min oxygen by high flow nasal cannula.  CT of the chest was done but there was no evidence of acute pulmonary embolism or any other acute cardiothoracic abnormality.  His troponin was elevated at 262 so he was started on IV heparin infusion for possible NSTEMI. Admitted for further evaluation and management.  Cardiology consulted, recommend stop heparin, as troponin elevation likely do to demand ischemia in setting of hypoxia.  Pulmonary consulted for acute on chronic respiratory failure with hypoxia in patient with  post-COVID pulmonary fibrosis.  Started dexamethasone.  Patient requiring 6-8 L/min HFNC to maintain oxygen saturations.   Subjective 11/20: Patient awake and reclined in bed.  States his breathing is okay when he is resting, but even just sitting up in the bed he gets very short of breath and feels his heart race.  Reports occasional cough, usually dry but occassional sputum.  He denies any chest pain, fevers, chills, N/V/D or other complaints.  States that he has been on a long steroid taper, last dose would have been today.  States his breathing seemed to  get worse as he got closer to the end of taper.    Assessment & Plan:   Principal Problem:   Acute on chronic respiratory failure with hypoxia (HCC) Active Problems:   Pulmonary fibrosis, postinflammatory (HCC)   Essential hypertension   Elevated troponin   COVID-19 virus detected   Emphysema of lung (HCC)   NSTEMI (non-ST elevated myocardial infarction) (HCC)   Acute on chronic respiratory failure with hypoxia COPD/emphysema See above narrative.  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 -Pulmonary following -Dexamethasone 6 mg daily -Bactrim DS twice daily for PCP prophylaxis while on steroids -Low-dose Lasix 20 mg daily -DuoNebs every 6 hours as needed -Albuterol every 4 hours as needed -Incentive spirometer -Supplemental oxygen to maintain O2 sats greater than 88%  Recent COVID Pneumonia  COVID-19 virus detected - patient's initial positive COVID-19 test was on 12/11/2018.  He subsequently had 2 admissions at [facility].  Since positive test > 28 days ago and patient without new fevers, this is most likely residual positive from prior infection.  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 -Hold oral agents -Continue Levemir 12 units daily -Sliding scale insulin coverage before meals/at bedtime -Continue home gabapentin  Hypotension -Started on midodrine   Essential hypertension -currently with borderline blood pressures Does not appear to be  on antihypertensives at home at this time.  Hyperlipidemia -continue home atorvastatin  GERD -continue Pepcid  BPH -continue finasteride   DVT prophylaxis: Lovenox   Code Status: Full Code  Family Communication: None at  bedside Disposition Plan: Pending clinical improvement, expect discharge   Consultants:   Pulmonary  Cardiology  Procedures:   None  Antimicrobials:   None   Objective: Vitals:   01/25/19 1115 01/25/19 1200 01/25/19 1527 01/25/19 1611  BP:   105/70 112/77  Pulse:  (!) 117 (!) 121   Resp:   18   Temp:   98 F (36.7 C)   TempSrc:      SpO2: (!) 89% 90% 94%   Weight:      Height:        Intake/Output Summary (Last 24 hours) at 01/25/2019 1613 Last data filed at 01/25/2019 1500 Gross per 24 hour  Intake 213.77 ml  Output 800 ml  Net -586.23 ml   Filed Weights   01/24/19 0713 01/24/19 1505  Weight: 56.7 kg 60.3 kg    Examination:  General exam: awake, alert, no acute distress but mild conversational dyspnea Respiratory system: Expiratory wheezing, decreased breath sounds, rhonchi right greater than left, high flow nasal cannula Cardiovascular system: normal S1/S2, RRR, no JVD, murmurs, rubs, gallops, no pedal edema.   Gastrointestinal system: soft, non-tender, non-distended abdomen, normal bowel sounds. Central nervous system: alert and oriented x4. no gross focal neurologic deficits, normal speech Extremities: moves all, no edema, normal tone Psychiatry: normal mood, congruent affect, judgement and insight appear normal    Data Reviewed: I have personally reviewed following labs and imaging studies  CBC: Recent Labs  Lab 01/24/19 0720 01/25/19 0047  WBC 7.7 5.1  NEUTROABS 5.9  --   HGB 11.1* 10.5*  HCT 34.5* 32.6*  MCV 86.5 85.1  PLT 231 242   Basic Metabolic Panel: Recent Labs  Lab 01/24/19 0816 01/25/19 0047  NA 136 135  K 4.0 4.3  CL 99 99  CO2 22 23  GLUCOSE 153* 161*  BUN 21 19  CREATININE 1.21 1.16  CALCIUM 8.2* 8.1*   GFR: Estimated Creatinine Clearance: 52 mL/min (by C-G formula based on SCr of 1.16 mg/dL). Liver Function Tests: Recent Labs  Lab 01/24/19 0816  AST 21  ALT 21  ALKPHOS 54  BILITOT 0.8  PROT 7.2  ALBUMIN  2.7*   No results for input(s): LIPASE, AMYLASE in the last 168 hours. No results for input(s): AMMONIA in the last 168 hours. Coagulation Profile: Recent Labs  Lab 01/24/19 0720  INR 1.0   Cardiac Enzymes: No results for input(s): CKTOTAL, CKMB, CKMBINDEX, TROPONINI in the last 168 hours. BNP (last 3 results) No results for input(s): PROBNP in the last 8760 hours. HbA1C: No results for input(s): HGBA1C in the last 72 hours. CBG: Recent Labs  Lab 01/24/19 1635 01/24/19 2042 01/25/19 0818 01/25/19 1147  GLUCAP 107* 217* 216* 178*   Lipid Profile: No results for input(s): CHOL, HDL, LDLCALC, TRIG, CHOLHDL, LDLDIRECT in the last 72 hours. Thyroid Function Tests: No results for input(s): TSH, T4TOTAL, FREET4, T3FREE, THYROIDAB in the last 72 hours. Anemia Panel: Recent Labs    01/24/19 0816  FERRITIN 1,601*   Sepsis Labs: Recent Labs  Lab 01/24/19 0720 01/24/19 0816  PROCALCITON  --  0.22  LATICACIDVEN 1.8  --     Recent Results (from the past 240 hour(s))  Culture, blood (Routine x 2)     Status: None (Preliminary result)  Collection Time: 01/24/19  7:20 AM   Specimen: BLOOD  Result Value Ref Range Status   Specimen Description BLOOD BLOOD RIGHT HAND  Final   Special Requests   Final    BOTTLES DRAWN AEROBIC AND ANAEROBIC Blood Culture results may not be optimal due to an inadequate volume of blood received in culture bottles   Culture   Final    NO GROWTH < 24 HOURS Performed at [facility], [address], [place], [place] [address]    Report Status PENDING  Incomplete  Culture, blood (Routine x 2)     Status: None (Preliminary result)   Collection Time: 01/24/19  7:21 AM   Specimen: BLOOD  Result Value Ref Range Status   Specimen Description BLOOD BLOOD LEFT HAND  Final   Special Requests   Final    BOTTLES DRAWN AEROBIC AND ANAEROBIC Blood Culture results may not be optimal due to an inadequate volume of blood received in culture bottles    Culture   Final    NO GROWTH < 24 HOURS Performed at [facility], [address] [place], [place] [address]    Report Status PENDING  Incomplete  SARS CORONAVIRUS 2 (TAT 6-24 HRS) Nasopharyngeal Nasopharyngeal Swab     Status: Abnormal   Collection Time: 01/24/19  1:17 PM   Specimen: Nasopharyngeal Swab  Result Value Ref Range Status   SARS Coronavirus 2 POSITIVE (A) NEGATIVE Final    Comment: RESULT CALLED TO, READ BACK BY AND VERIFIED WITH: C MURRAY,RN 2015 01/24/2019 D BRADLEY (NOTE) SARS-CoV-2 target nucleic acids are DETECTED. The SARS-CoV-2 RNA is generally detectable in upper and lower respiratory specimens during the acute phase of infection. Positive results are indicative of active infection with SARS-CoV-2. Clinical  correlation with patient history and other diagnostic information is necessary to determine patient infection status. Positive results do  not rule out bacterial infection or co-infection with other viruses. The expected result is Negative. Fact Sheet for Patients: HairSlick.no Fact Sheet for Healthcare Providers: quierodirigir.com This test is not yet approved or cleared by the [place] FDA and  has been authorized for detection and/or diagnosis of SARS-CoV-2 by FDA under an Emergency Use Authorization (EUA). This EUA will remain  in effect (meaning this test can be used) for  the duration of the COVID-19 declaration under Section 564(b)(1) of the Act, 21 U.S.C. section 360bbb-3(b)(1), unless the authorization is terminated or revoked sooner. Performed at [facility], [address], [place], [place] [address]          Radiology Studies: Ct Angio Chest Pe W/cm &/or Wo Cm  Result Date: 01/24/2019 CLINICAL DATA:  Shortness of breath.  Recent COVID-19 pneumonia EXAM: CT ANGIOGRAPHY CHEST WITH CONTRAST TECHNIQUE: Multidetector CT imaging of the chest was performed using the  standard protocol during bolus administration of intravenous contrast. Multiplanar CT image reconstructions and MIPs were obtained to evaluate the vascular anatomy. CONTRAST:  75mL OMNIPAQUE IOHEXOL 350 MG/ML SOLN COMPARISON:  Chest radiograph January 24, 2019 and chest CT January 03, 2019 FINDINGS: Cardiovascular: There is no demonstrable pulmonary embolus. There is no thoracic aortic aneurysm or dissection. Visualized great vessels appear unremarkable. Note that the right innominate and left common carotid arteries arise as a common trunk, an anatomic variant. No evident pericardial effusion or pericardial thickening. Main pulmonary artery measures 3.1 cm, borderline for pulmonary arterial hypertension. Mediastinum/Nodes: Thyroid appears unremarkable. There is a subcarinal lymph node measuring 1.7 x 1.3 cm, slightly increased in size  compared to the previous study. There are scattered subcentimeter mediastinal lymph nodes elsewhere. There is a small hiatal hernia. Lungs/Pleura: There is underlying emphysematous change. There is extensive fibrosis throughout the lungs bilaterally. There are areas of associated atelectatic change. There is no frank airspace consolidation evident currently. There are no pleural effusions. There is a degree of lower lobe traction bronchiectasis. Upper Abdomen: There is mild reflux of contrast into the inferior vena cava and hepatic veins. Visualized upper abdominal structures otherwise appear unremarkable. Musculoskeletal: No blastic or lytic bone lesions. No chest wall lesions are evident. Review of the MIP images confirms the above findings. IMPRESSION: 1. No demonstrable pulmonary embolus. No thoracic aortic aneurysm or dissection. 2. Underlying emphysematous change with extensive fibrosis throughout the lungs bilaterally. Areas of atelectatic change noted. No well-defined airspace consolidation. A degree of residual viral pneumonitis is possible intermingled with this extensive  fibrosis. There is also lower lobe bronchiectatic change bilaterally. 3. Prominent subcarinal lymph node which potentially may be due to the parenchymal lung changes. No other frank adenopathy. 4. Borderline prominent main pulmonary outflow tract, a finding that may be indicative of a degree of underlying pulmonary arterial hypertension. 5. Mild reflux of contrast into the inferior vena cava and hepatic veins, a finding that may be indicative of a degree of increased right heart pressure. 6.  Small hiatal hernia. Emphysema (ICD10-J43.9). Electronically Signed   By: Lowella Grip III M.D.   On: 01/24/2019 10:15   Dg Chest Port 1 View  Result Date: 01/24/2019 CLINICAL DATA:  Shortness of breath. COVID-19 positive last month. EXAM: PORTABLE CHEST 1 VIEW COMPARISON:  01/09/2019 FINDINGS: The cardiomediastinal silhouette is unchanged. The lungs are hypoinflated. Widespread coarse interstitial and patchy airspace opacities throughout both lungs are unchanged. No sizable pleural effusion or pneumothorax is identified. No acute osseous abnormality is seen. IMPRESSION: Unchanged extensive interstitial and patchy airspace opacities bilaterally. Electronically Signed   By: Logan Bores M.D.   On: 01/24/2019 08:10        Scheduled Meds:  aspirin EC  81 mg Oral Daily   atorvastatin  40 mg Oral Daily   dexamethasone  6 mg Oral Daily   enoxaparin (LOVENOX) injection  40 mg Subcutaneous Q24H   famotidine  20 mg Oral Daily   feeding supplement (NEPRO CARB STEADY)  237 mL Oral TID BM   finasteride  5 mg Oral Daily   furosemide  20 mg Intravenous Daily   gabapentin  600 mg Oral QHS   insulin aspart  0-5 Units Subcutaneous QHS   insulin aspart  0-9 Units Subcutaneous TID WC   insulin detemir  12 Units Subcutaneous Daily   mouth rinse  15 mL Mouth Rinse BID   midodrine  5 mg Oral TID WC   [START ON 01/26/2019] multivitamin with minerals  1 tablet Oral Daily   sulfamethoxazole-trimethoprim   1 tablet Oral Q12H   Continuous Infusions:    LOS: 1 day    Time spent: 40-45 minutes   Ezekiel Slocumb, DO [facility] Pager: [phone]  If 7PM-7AM, please contact night-coverage [email or web address] Password [facility] 01/25/2019, 4:13 PM

## 2019-01-25 NOTE — Progress Notes (Signed)
Contact and Airborne precautions discontinued in accordance with Infectious Prevention's recommendation and MD order.  Pt's HR is sustaining ST in the 120's.  SpO2 on 6L HFNC was 86%.  Increased oxgyen to 7 L HFNC and SpO2 now 89%. MD informed.

## 2019-01-25 NOTE — Progress Notes (Addendum)
Notify Dr. Arbutus Ped about patient;s HR sustaining to 120's ST, patient not in any HR controlled medication, cardiology is avoiding beta blocker. Patient's BP has been soft this afternoon and let Dr. Lanney Gins know, started him in midodrine. Per Dr. Jacqlyn Larsen to monitor HR for now and if HR goes up to 130's-140's and sustaining to let MD know. RN will continue to monitor.   Update: HR now is sustaining to 130's order for Cardizem IV push given. RN will continue to monitor.   Also talked to patient's wife and updated her about the plan of care for this patient. She wants to come and visit the patient, she also was expose with him and per patient was positive and her wife works at [employer] which they had an outbreak, let her know that she probably could not visit him because of this situation.

## 2019-01-25 NOTE — Consult Note (Signed)
Pulmonary Medicine          Date: 01/25/2019,   MRN# [id number] Bryan Oconnor 27-Nov-1950     AdmissionWeight: 56.7 kg                 CurrentWeight: 60.3 kg  Referring physician: Dr. Denton Lank    CHIEF COMPLAINT:   Post [phone] pneumonitis   HISTORY OF PRESENT ILLNESS   This is a pleasant 68 year old male with a history of diabetes and essential hypertension, CKD 3, recent hospitalization at [facility] in middle of October from the 12th to the 17th for acute COVID-19 infection status post acute hypoxemic respiratory failure due to COVID-19.  He subsequently was discharged however returned to the hospital with similar problems and required rehospitalization was subsequently discharged home on 3 L/min nasal cannula.  Since being home he reports being short of breath without reaching baseline.  Patient reports having significant desaturations at home while checking peripheral SPO2.  He denies additional symptoms of vomiting diarrhea abdominal pain chest pain syncope or presyncope.  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   PAST MEDICAL HISTORY   Past Medical History:  Diagnosis Date   Diabetes mellitus without complication (HCC)    Hypertension      SURGICAL HISTORY   History reviewed. No pertinent surgical history.   FAMILY HISTORY   History reviewed. No pertinent family history.   SOCIAL HISTORY   Social History   Tobacco Use   Smoking status: Never Smoker   Smokeless tobacco: Never Used  Substance Use Topics   Alcohol use: Never    Frequency: Never   Drug use: Not on file     MEDICATIONS    Home Medication:    Current Medication:  Current Facility-Administered Medications:    acetaminophen (TYLENOL) tablet 650 mg, 650 mg, Oral, Q6H PRN **OR** acetaminophen (TYLENOL) suppository 650 mg, 650  mg, Rectal, Q6H PRN, Lurene Shadow, MD   albuterol (VENTOLIN HFA) 108 (90 Base) MCG/ACT inhaler 2 puff, 2 puff, Inhalation, Q4H PRN, Lurene Shadow, MD, 2 puff at 01/25/19 0905   aspirin EC tablet 81 mg, 81 mg, Oral, Daily, Lurene Shadow, MD, 81 mg at 01/25/19 0903   atorvastatin (LIPITOR) tablet 40 mg, 40 mg, Oral, Daily, Lurene Shadow, MD, 40 mg at 01/25/19 0903   enoxaparin (LOVENOX) injection 40 mg, 40 mg, Subcutaneous, Q24H, Esaw Grandchild A, DO, 40 mg at 01/25/19 1504   famotidine (PEPCID) tablet 20 mg, 20 mg, Oral, Daily, Lurene Shadow, MD, 20 mg at 01/25/19 4010   feeding supplement (NEPRO CARB STEADY) liquid 237 mL, 237 mL, Oral, TID BM, Griffith, Kelly A, DO, 237 mL at 01/25/19 1415   finasteride (PROSCAR) tablet 5 mg, 5 mg, Oral, Daily, Lurene Shadow, MD, 5 mg at 01/25/19 2725   gabapentin (NEURONTIN) capsule 600 mg, 600 mg, Oral, QHS, Lurene Shadow, MD   insulin aspart (novoLOG) injection 0-5 Units, 0-5 Units, Subcutaneous, QHS, Lurene Shadow, MD, 2 Units at 01/24/19 2130   insulin aspart (novoLOG) injection 0-9 Units, 0-9 Units, Subcutaneous, TID WC, Lurene Shadow, MD, 2 Units at 01/25/19 1213   insulin detemir (LEVEMIR) injection 12 Units, 12 Units, Subcutaneous, Daily, Lurene Shadow, MD, 12 Units at 01/25/19 3664   MEDLINE mouth rinse, 15 mL, Mouth Rinse, BID, Lurene Shadow, MD, 15 mL at 01/24/19 2135   [START ON 01/26/2019] multivitamin with minerals tablet 1 tablet, 1 tablet,  Oral, Daily, Nicole Kindred A, DO    ALLERGIES   Pollen extract     REVIEW OF SYSTEMS    Review of Systems:  Gen:  Denies  fever, sweats, chills weigh loss  HEENT: Denies blurred vision, double vision, ear pain, eye pain, hearing loss, nose bleeds, sore throat Cardiac:  No dizziness, chest pain or heaviness, chest tightness,edema Resp:   Denies cough or sputum porduction, shortness of breath,wheezing, hemoptysis,  Gi: Denies swallowing difficulty, stomach pain, nausea or  vomiting, diarrhea, constipation, bowel incontinence Gu:  Denies bladder incontinence, burning urine Ext:   Denies Joint pain, stiffness or swelling Skin: Denies  skin rash, easy bruising or bleeding or hives Endoc:  Denies polyuria, polydipsia , polyphagia or weight change Psych:   Denies depression, insomnia or hallucinations   Other:  All other systems negative   VS: BP 105/70 (BP Location: Right Arm)    Pulse (!) 121    Temp 98 F (36.7 C)    Resp 18    Ht 5\' 6"  (1.676 m)    Wt 60.3 kg    SpO2 94%    BMI 21.46 kg/m      PHYSICAL EXAM    GENERAL:NAD, no fevers, chills, no weakness no fatigue HEAD: Normocephalic, atraumatic.  EYES: Pupils equal, round, reactive to light. Extraocular muscles intact. No scleral icterus.  MOUTH: Moist mucosal membrane. Dentition intact. No abscess noted.  EAR, NOSE, THROAT: Clear without exudates. No external lesions.  NECK: Supple. No thyromegaly. No nodules. No JVD.  PULMONARY: Diffuse coarse rhonchi right sided +wheezes CARDIOVASCULAR: S1 and S2. Regular rate and rhythm. No murmurs, rubs, or gallops. No edema. Pedal pulses 2+ bilaterally.  GASTROINTESTINAL: Soft, nontender, nondistended. No masses. Positive bowel sounds. No hepatosplenomegaly.  MUSCULOSKELETAL: No swelling, clubbing, or edema. Range of motion full in all extremities.  NEUROLOGIC: Cranial nerves II through XII are intact. No gross focal neurological deficits. Sensation intact. Reflexes intact.  SKIN: No ulceration, lesions, rashes, or cyanosis. Skin warm and dry. Turgor intact.  PSYCHIATRIC: Mood, affect within normal limits. The patient is awake, alert and oriented x 3. Insight, judgment intact.       IMAGING    Dg Chest 1 View  Result Date: 01/09/2019 CLINICAL DATA:  Cough, hyperglycemia EXAM: CHEST  1 VIEW COMPARISON:  01/02/2019 FINDINGS: Diffuse patchy opacities and interstitial prominence throughout the lungs, stable since prior study. Heart is normal size. No visible  effusions or acute bony abnormality. IMPRESSION: Severe patchy opacities and interstitial prominence, unchanged since prior study. Electronically Signed   By: Rolm Baptise M.D.   On: 01/09/2019 23:44   Ct Chest Wo Contrast  Result Date: 01/03/2019 CLINICAL DATA:  Inpatient.  COVID-19 pneumonia. EXAM: CT CHEST WITHOUT CONTRAST TECHNIQUE: Multidetector CT imaging of the chest was performed following the standard protocol without IV contrast. COMPARISON:  Chest radiograph from one day prior. FINDINGS: Motion degraded scan, limiting assessment. Cardiovascular: Normal heart size. No significant pericardial effusion/thickening. Normal course and caliber of the thoracic aorta. Top-normal caliber main pulmonary artery (3.2 cm diameter). Mediastinum/Nodes: No discrete thyroid nodules. Unremarkable esophagus. No axillary adenopathy. Mildly enlarged 1.1 cm subcarinal node (series 2/image 28). No discrete hilar adenopathy on this noncontrast scan. Lungs/Pleura: No pneumothorax. No pleural effusion. Moderate centrilobular and paraseptal emphysema. There is extensive patchy confluent crazy paving opacity throughout both lungs involving all lung lobes with involvement of the peripheral, perilobular and peribronchovascular lungs, without a clear apicobasilar gradient to these findings. No discrete lung masses or significant pulmonary  nodules. Suggestion of mild parenchymal distortion and mild traction bronchiectasis throughout both lungs. Upper abdomen: No acute abnormality. Musculoskeletal: No aggressive appearing focal osseous lesions. Mild thoracic spondylosis. IMPRESSION: 1. Limited motion degraded scan. Extensive patchy confluent crazy paving opacity throughout both lungs with some associated parenchymal distortion and suggestion of mild traction bronchiectasis. Findings are compatible with severe COVID-19 pneumonia with suggestion of superimposed early postinflammatory fibrosis. Consider follow-up high-resolution chest  CT study in 3-6 months, as clinically warranted. 2. Mild subcarinal lymphadenopathy, nonspecific, presumably reactive, which can also be reassessed on follow-up chest CT. Emphysema (ICD10-J43.9). Electronically Signed   By: Delbert Phenix M.D.   On: 01/03/2019 15:54   Ct Angio Chest Pe W/cm &/or Wo Cm  Result Date: 01/24/2019 CLINICAL DATA:  Shortness of breath.  Recent COVID-19 pneumonia EXAM: CT ANGIOGRAPHY CHEST WITH CONTRAST TECHNIQUE: Multidetector CT imaging of the chest was performed using the standard protocol during bolus administration of intravenous contrast. Multiplanar CT image reconstructions and MIPs were obtained to evaluate the vascular anatomy. CONTRAST:  75mL OMNIPAQUE IOHEXOL 350 MG/ML SOLN COMPARISON:  Chest radiograph January 24, 2019 and chest CT January 03, 2019 FINDINGS: Cardiovascular: There is no demonstrable pulmonary embolus. There is no thoracic aortic aneurysm or dissection. Visualized great vessels appear unremarkable. Note that the right innominate and left common carotid arteries arise as a common trunk, an anatomic variant. No evident pericardial effusion or pericardial thickening. Main pulmonary artery measures 3.1 cm, borderline for pulmonary arterial hypertension. Mediastinum/Nodes: Thyroid appears unremarkable. There is a subcarinal lymph node measuring 1.7 x 1.3 cm, slightly increased in size compared to the previous study. There are scattered subcentimeter mediastinal lymph nodes elsewhere. There is a small hiatal hernia. Lungs/Pleura: There is underlying emphysematous change. There is extensive fibrosis throughout the lungs bilaterally. There are areas of associated atelectatic change. There is no frank airspace consolidation evident currently. There are no pleural effusions. There is a degree of lower lobe traction bronchiectasis. Upper Abdomen: There is mild reflux of contrast into the inferior vena cava and hepatic veins. Visualized upper abdominal structures  otherwise appear unremarkable. Musculoskeletal: No blastic or lytic bone lesions. No chest wall lesions are evident. Review of the MIP images confirms the above findings. IMPRESSION: 1. No demonstrable pulmonary embolus. No thoracic aortic aneurysm or dissection. 2. Underlying emphysematous change with extensive fibrosis throughout the lungs bilaterally. Areas of atelectatic change noted. No well-defined airspace consolidation. A degree of residual viral pneumonitis is possible intermingled with this extensive fibrosis. There is also lower lobe bronchiectatic change bilaterally. 3. Prominent subcarinal lymph node which potentially may be due to the parenchymal lung changes. No other frank adenopathy. 4. Borderline prominent main pulmonary outflow tract, a finding that may be indicative of a degree of underlying pulmonary arterial hypertension. 5. Mild reflux of contrast into the inferior vena cava and hepatic veins, a finding that may be indicative of a degree of increased right heart pressure. 6.  Small hiatal hernia. Emphysema (ICD10-J43.9). Electronically Signed   By: Bretta Bang III M.D.   On: 01/24/2019 10:15   Dg Chest Port 1 View  Result Date: 01/24/2019 CLINICAL DATA:  Shortness of breath. COVID-19 positive last month. EXAM: PORTABLE CHEST 1 VIEW COMPARISON:  01/09/2019 FINDINGS: The cardiomediastinal silhouette is unchanged. The lungs are hypoinflated. Widespread coarse interstitial and patchy airspace opacities throughout both lungs are unchanged. No sizable pleural effusion or pneumothorax is identified. No acute osseous abnormality is seen. IMPRESSION: Unchanged extensive interstitial and patchy airspace opacities bilaterally. Electronically Signed  By: Sebastian AcheAllen  Grady M.D.   On: 01/24/2019 08:10   Dg Chest Portable 1 View  Result Date: 01/02/2019 CLINICAL DATA:  Short of breath EXAM: PORTABLE CHEST 1 VIEW COMPARISON:  12/17/2018 FINDINGS: Diffuse bilateral interstitial and ground-glass  opacity with peripheral consolidations on the right. No pleural effusion. Normal heart size. No pneumothorax. IMPRESSION: Development of fairly extensive interstitial and ground-glass opacities, consistent with multifocal pneumonia and provided clinical history of COVID-19 positivity. Electronically Signed   By: Jasmine PangKim  Fujinaga M.D.   On: 01/02/2019 18:05   Vas [place] Lower Extremity Venous (dvt)  Result Date: 01/04/2019  Lower Venous Study Indications: Covid with elevated d-dimer.  Comparison Study: No prior. Performing Technologist: Marilynne Halstedita Sturdivant RDMS, RVT  Examination Guidelines: A complete evaluation includes B-mode imaging, spectral Doppler, color Doppler, and power Doppler as needed of all accessible portions of each vessel. Bilateral testing is considered an integral part of a complete examination. Limited examinations for reoccurring indications may be performed as noted.  +---------+---------------+---------+-----------+----------+--------------+  RIGHT     Compressibility Phasicity Spontaneity Properties Thrombus Aging  +---------+---------------+---------+-----------+----------+--------------+  CFV       Full            Yes       Yes                                    +---------+---------------+---------+-----------+----------+--------------+  SFJ       Full                                                             +---------+---------------+---------+-----------+----------+--------------+  FV Prox   Full                                                             +---------+---------------+---------+-----------+----------+--------------+  FV Mid    Full                                                             +---------+---------------+---------+-----------+----------+--------------+  FV Distal Full                                                             +---------+---------------+---------+-----------+----------+--------------+  PFV       Full                                                              +---------+---------------+---------+-----------+----------+--------------+  POP       Full  Yes       Yes                                    +---------+---------------+---------+-----------+----------+--------------+  PTV       Full                                                             +---------+---------------+---------+-----------+----------+--------------+  PERO      Full                                                             +---------+---------------+---------+-----------+----------+--------------+   +---------+---------------+---------+-----------+----------+--------------+  LEFT      Compressibility Phasicity Spontaneity Properties Thrombus Aging  +---------+---------------+---------+-----------+----------+--------------+  CFV       Full            Yes       Yes                                    +---------+---------------+---------+-----------+----------+--------------+  SFJ       Full                                                             +---------+---------------+---------+-----------+----------+--------------+  FV Prox   Full                                                             +---------+---------------+---------+-----------+----------+--------------+  FV Mid    Full                                                             +---------+---------------+---------+-----------+----------+--------------+  FV Distal Full                                                             +---------+---------------+---------+-----------+----------+--------------+  PFV       Full                                                             +---------+---------------+---------+-----------+----------+--------------+  POP       Full            Yes       Yes                                    +---------+---------------+---------+-----------+----------+--------------+  PTV       Full                                                              +---------+---------------+---------+-----------+----------+--------------+  PERO      Full                                                             +---------+---------------+---------+-----------+----------+--------------+     Summary: Right: There is no evidence of deep vein thrombosis in the lower extremity. No cystic structure found in the popliteal fossa. Left: There is no evidence of deep vein thrombosis in the lower extremity. No cystic structure found in the popliteal fossa.  *See table(s) above for measurements and observations. Electronically signed by Waverly Ferrari MD on 01/04/2019 at 2:19:24 PM.    Final             ASSESSMENT/PLAN   Acute on chronic hypoxemic respiratory failure  -due to severe ARDS post COVID19 infection -appears to be developing changes consistent with fibrosis -patient is s/p hospitalization for acute COVID 19 infection -will start dexamethasone 6mg  po daily -bactrim ss BID for PPX while on recurrent steroids for PCP prophylaxis -low dose lasix 20 mg po daily  -will consider low dose nacrotic for dyspnea if necessary    Centrilobular emphysema -DuoNeb - q6h PRN  -steroids as above  -IS at bedside     Atelectasis - bilateral worse at bases -metaneb q8h with nebulizer therapy -IS at bedside     Thank you for allowing me to participate in the care of this patient.    Patient/Family are satisfied with care plan and all questions have been answered.   This document was prepared using Dragon voice recognition software and may include unintentional dictation errors.     , M.D.  Division of Pulmonary & Critical Care Medicine  [facility]

## 2019-01-25 NOTE — Progress Notes (Addendum)
Initial Nutrition Assessment  DOCUMENTATION CODES:   Not applicable  INTERVENTION:   Nepro Shake po TID, each supplement provides 425 kcal and 19 grams protein  MVI daily   Liberalize diet   Pt at high refeed risk; recommend monitor K, Mg and P labs daily as oral intake improves.   NUTRITION DIAGNOSIS:   Unintentional weight loss related to acute illness(COVID 19) as evidenced by 20 percent weight loss in 1-2 months.  GOAL:   Patient will meet greater than or equal to 90% of their needs  MONITOR:   PO intake, Supplement acceptance, Labs, Weight trends, Skin, I & O's  REASON FOR ASSESSMENT:   Malnutrition Screening Tool    ASSESSMENT:   68 y.o. 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   Unable to reach patient via phone. Per chart review, pt with decreased appetite and oral intake for the past month. Pt documented to be eating 50-100% of meals prior to his last discharge on 11/2. Per chart, pt has lost 33lbs(20%) over the past month 1-2 months; this is severe weight loss. RD will add supplements and MVI to help pt meet his estimated needs. RD will also liberalize the heart healthy restriction from patient's diet as this is restrictive of protein. Suspect patient at high refeed risk; recommend monitor K, Mg and P labs daily as oral intake improves.   Pt at high risk for malnutrition but unable to diagnose at this time as NFPE cannot be performed.   Medications reviewed and include: aspirin, pepcid, insulin, heparin  Labs reviewed: Hgb 10.5(L), Hct 32.6(L) cbgs- 187, 233, 107, 217 x 24 hrs AIC 11.5(H)- 10/29  Unable to complete Nutrition-Focused physical exam at this time as pt with COVID 19.   Diet Order:   Diet Order            Diet heart  healthy/carb modified Room service appropriate? Yes; Fluid consistency: Thin  Diet effective now             EDUCATION NEEDS:   Not appropriate for education at this time  Skin:  Skin Assessment: Reviewed RN Assessment  Last BM:  11/18  Height:   Ht Readings from Last 1 Encounters:  01/24/19 5\' 6"  (1.676 m)    Weight:   Wt Readings from Last 1 Encounters:  01/24/19 60.3 kg    Ideal Body Weight:  59 kg  BMI:  Body mass index is 21.46 kg/m.  Estimated Nutritional Needs:   Kcal:  1800-2100kcal/day  Protein:  90-105g/day  Fluid:  >1.5L/day  Koleen Distance MS, RD, LDN Pager #- [phone] Office#- [phone] After Hours Pager: [phone]

## 2019-01-26 DIAGNOSIS — I10 Essential (primary) hypertension: Secondary | ICD-10-CM

## 2019-01-26 DIAGNOSIS — N179 Acute kidney failure, unspecified: Secondary | ICD-10-CM

## 2019-01-26 DIAGNOSIS — R000 Tachycardia, unspecified: Secondary | ICD-10-CM

## 2019-01-26 DIAGNOSIS — J432 Centrilobular emphysema: Secondary | ICD-10-CM

## 2019-01-26 LAB — CBC WITH DIFFERENTIAL/PLATELET
Abs Immature Granulocytes: 0.03 10*3/uL (ref 0.00–0.07)
Basophils Absolute: 0 10*3/uL (ref 0.0–0.1)
Basophils Relative: 0 %
Eosinophils Absolute: 0 10*3/uL (ref 0.0–0.5)
Eosinophils Relative: 0 %
HCT: 31.6 % — ABNORMAL LOW (ref 39.0–52.0)
Hemoglobin: 10.4 g/dL — ABNORMAL LOW (ref 13.0–17.0)
Immature Granulocytes: 1 %
Lymphocytes Relative: 19 %
Lymphs Abs: 0.6 10*3/uL — ABNORMAL LOW (ref 0.7–4.0)
MCH: 27.4 pg (ref 26.0–34.0)
MCHC: 32.9 g/dL (ref 30.0–36.0)
MCV: 83.2 fL (ref 80.0–100.0)
Monocytes Absolute: 0.1 10*3/uL (ref 0.1–1.0)
Monocytes Relative: 5 %
Neutro Abs: 2.3 10*3/uL (ref 1.7–7.7)
Neutrophils Relative %: 75 %
Platelets: 290 10*3/uL (ref 150–400)
RBC: 3.8 MIL/uL — ABNORMAL LOW (ref 4.22–5.81)
RDW: 14.5 % (ref 11.5–15.5)
WBC: 3.1 10*3/uL — ABNORMAL LOW (ref 4.0–10.5)
nRBC: 0 % (ref 0.0–0.2)

## 2019-01-26 LAB — BASIC METABOLIC PANEL
Anion gap: 13 (ref 5–15)
BUN: 39 mg/dL — ABNORMAL HIGH (ref 8–23)
CO2: 21 mmol/L — ABNORMAL LOW (ref 22–32)
Calcium: 7.8 mg/dL — ABNORMAL LOW (ref 8.9–10.3)
Chloride: 100 mmol/L (ref 98–111)
Creatinine, Ser: 1.64 mg/dL — ABNORMAL HIGH (ref 0.61–1.24)
GFR calc Af Amer: 49 mL/min — ABNORMAL LOW (ref >60)
GFR calc non Af Amer: 42 mL/min — ABNORMAL LOW (ref >60)
Glucose, Bld: 366 mg/dL — ABNORMAL HIGH (ref 70–99)
Potassium: 4.7 mmol/L (ref 3.5–5.1)
Sodium: 134 mmol/L — ABNORMAL LOW (ref 135–145)

## 2019-01-26 LAB — GLUCOSE, CAPILLARY
Glucose-Capillary: 318 mg/dL — ABNORMAL HIGH (ref 70–99)
Glucose-Capillary: 452 mg/dL — ABNORMAL HIGH (ref 70–99)
Glucose-Capillary: 363 mg/dL — ABNORMAL HIGH (ref 70–99)
Glucose-Capillary: 365 mg/dL — ABNORMAL HIGH (ref 70–99)

## 2019-01-26 LAB — MAGNESIUM: Magnesium: 2 mg/dL (ref 1.7–2.4)

## 2019-01-26 MED ORDER — MORPHINE SULFATE (PF) 2 MG/ML IV SOLN: 1.0000 mg | Freq: Four times a day (QID) | INTRAVENOUS | Status: DC | PRN

## 2019-01-26 MED ORDER — INSULIN ASPART 100 UNIT/ML ~~LOC~~ SOLN
0.0000 [IU] | Freq: Three times a day (TID) | SUBCUTANEOUS | Status: DC
  Administered 2019-01-26: 17:00:00 15 [IU] via SUBCUTANEOUS
  Administered 2019-01-27: 11 [IU] via SUBCUTANEOUS
  Administered 2019-01-27: 15 [IU] via SUBCUTANEOUS
  Administered 2019-01-27: 5 [IU] via SUBCUTANEOUS
  Administered 2019-01-28: 8 [IU] via SUBCUTANEOUS
  Administered 2019-01-28: 3 [IU] via SUBCUTANEOUS
  Administered 2019-01-29: 8 [IU] via SUBCUTANEOUS
  Administered 2019-01-29: 5 [IU] via SUBCUTANEOUS
  Administered 2019-01-30: 3 [IU] via SUBCUTANEOUS
  Administered 2019-01-30 (×2): 5 [IU] via SUBCUTANEOUS
  Administered 2019-01-31: 15 [IU] via SUBCUTANEOUS
  Administered 2019-01-31: 3 [IU] via SUBCUTANEOUS
  Administered 2019-01-31: 2 [IU] via SUBCUTANEOUS
  Administered 2019-02-01: 3 [IU] via SUBCUTANEOUS
  Administered 2019-02-01: 11 [IU] via SUBCUTANEOUS
  Administered 2019-02-01: 3 [IU] via SUBCUTANEOUS
  Filled 2019-01-26 (×17): qty 1

## 2019-01-26 MED ORDER — INSULIN ASPART 100 UNIT/ML ~~LOC~~ SOLN: 0.0000 [IU] | Freq: Three times a day (TID) | SUBCUTANEOUS | Status: DC

## 2019-01-26 MED ORDER — INSULIN DETEMIR 100 UNIT/ML ~~LOC~~ SOLN
15.0000 [IU] | Freq: Every day | SUBCUTANEOUS | Status: DC
  Filled 2019-01-26: qty 0.15

## 2019-01-26 MED ORDER — INSULIN ASPART 100 UNIT/ML ~~LOC~~ SOLN
10.0000 [IU] | Freq: Once | SUBCUTANEOUS | Status: AC
  Administered 2019-01-26: 10 [IU] via SUBCUTANEOUS

## 2019-01-26 NOTE — Progress Notes (Signed)
MD notified. Pts CBG is 452. Orders for 10 units of novolog applied. MD also notified of Pts HR of 117 sustained. No new orders for HR at this time. I will continue to assess.

## 2019-01-26 NOTE — Progress Notes (Signed)
Attempted metaneb with this patient however he would awaken when I called his name but fall right back off into a sound rest. Patient not able to stay awake at this time to do therapy

## 2019-01-26 NOTE — Progress Notes (Signed)
Pulmonary Medicine          Date: 01/26/2019,   MRN# [id number] Patrice ParadiseCharles E Mathena 10/03/1950     AdmissionWeight: 56.7 kg                 CurrentWeight: 60.1 kg  Referring physician: Dr. Denton LankGriffith    CHIEF COMPLAINT:   Post [phone] pneumonitis   SUBJECTIVE   Patient is still significantly dyspeic.  He can take tidal volume appx 520 on best attempt with IS.  Has anxiety due to breathlessness and dyspnea.    PAST MEDICAL HISTORY   Past Medical History:  Diagnosis Date   Diabetes mellitus without complication (HCC)    Hypertension      SURGICAL HISTORY   History reviewed. No pertinent surgical history.   FAMILY HISTORY   History reviewed. No pertinent family history.   SOCIAL HISTORY   Social History   Tobacco Use   Smoking status: Never Smoker   Smokeless tobacco: Never Used  Substance Use Topics   Alcohol use: Never    Frequency: Never   Drug use: Not on file     MEDICATIONS    Home Medication:    Current Medication:  Current Facility-Administered Medications:    acetaminophen (TYLENOL) tablet 650 mg, 650 mg, Oral, Q6H PRN **OR** acetaminophen (TYLENOL) suppository 650 mg, 650 mg, Rectal, Q6H PRN, Lurene ShadowAyiku, Bernard, MD   albuterol (VENTOLIN HFA) 108 (90 Base) MCG/ACT inhaler 2 puff, 2 puff, Inhalation, Q4H PRN, Lurene ShadowAyiku, Bernard, MD, 2 puff at 01/25/19 0905   aspirin EC tablet 81 mg, 81 mg, Oral, Daily, Lurene ShadowAyiku, Bernard, MD, 81 mg at 01/26/19 1003   atorvastatin (LIPITOR) tablet 40 mg, 40 mg, Oral, Daily, Lurene ShadowAyiku, Bernard, MD, 40 mg at 01/26/19 1003   dexamethasone (DECADRON) tablet 6 mg, 6 mg, Oral, Daily, Karna ChristmasAleskerov, Clotiel Troop, MD, 6 mg at 01/26/19 1004   enoxaparin (LOVENOX) injection 40 mg, 40 mg, Subcutaneous, Q24H, Esaw GrandchildGriffith, Kelly A, DO, 40 mg at 01/26/19 1603   famotidine (PEPCID) tablet 20 mg, 20 mg, Oral, Daily, Lurene ShadowAyiku, Bernard, MD, 20 mg at 01/26/19 1002   feeding supplement (NEPRO CARB STEADY) liquid 237 mL, 237 mL, Oral, TID  BM, Esaw GrandchildGriffith, Kelly A, DO, 237 mL at 01/26/19 1602   finasteride (PROSCAR) tablet 5 mg, 5 mg, Oral, Daily, Lurene ShadowAyiku, Bernard, MD, 5 mg at 01/26/19 1003   gabapentin (NEURONTIN) capsule 600 mg, 600 mg, Oral, QHS, Lurene ShadowAyiku, Bernard, MD, 600 mg at 01/25/19 2129   insulin aspart (novoLOG) injection 0-15 Units, 0-15 Units, Subcutaneous, TID WC, Esaw GrandchildGriffith, Kelly A, DO   [START ON 01/27/2019] insulin detemir (LEVEMIR) injection 15 Units, 15 Units, Subcutaneous, Daily, Esaw GrandchildGriffith, Kelly A, DO   MEDLINE mouth rinse, 15 mL, Mouth Rinse, BID, Lurene ShadowAyiku, Bernard, MD, 15 mL at 01/26/19 1007   midodrine (PROAMATINE) tablet 5 mg, 5 mg, Oral, TID WC, Aliena Ghrist, MD, 5 mg at 01/26/19 1251   multivitamin with minerals tablet 1 tablet, 1 tablet, Oral, Daily, Esaw GrandchildGriffith, Kelly A, DO, 1 tablet at 01/26/19 1003   sulfamethoxazole-trimethoprim (BACTRIM) 400-80 MG per tablet 1 tablet, 1 tablet, Oral, Q12H, Vida RiggerAleskerov, Lyndel Dancel, MD, 1 tablet at 01/26/19 1004    ALLERGIES   Pollen extract     REVIEW OF SYSTEMS    Review of Systems:  Gen:  Denies  fever, sweats, chills weigh loss  HEENT: Denies blurred vision, double vision, ear pain, eye pain, hearing loss, nose bleeds, sore throat Cardiac:  No dizziness, chest pain or heaviness, chest tightness,edema Resp:  Reports cough , dyspnea and severe sob Gi: Denies swallowing difficulty, stomach pain, nausea or vomiting, diarrhea, constipation, bowel incontinence Gu:  Denies bladder incontinence, burning urine Ext:   Denies Joint pain, stiffness or swelling Skin: Denies  skin rash, easy bruising or bleeding or hives Endoc:  Denies polyuria, polydipsia , polyphagia or weight change Psych:   Denies depression, insomnia or hallucinations   Other:  All other systems negative   VS: BP 110/70 (BP Location: Left Arm)    Pulse (!) 113    Temp (!) 97.3 F (36.3 C) (Oral)    Resp 20    Ht 5\' 6"  (1.676 m)    Wt 60.1 kg    SpO2 91%    BMI 21.40 kg/m      PHYSICAL EXAM     GENERAL:NAD, no fevers, chills, no weakness no fatigue HEAD: Normocephalic, atraumatic.  EYES: Pupils equal, round, reactive to light. Extraocular muscles intact. No scleral icterus.  MOUTH: Moist mucosal membrane. Dentition intact. No abscess noted.  EAR, NOSE, THROAT: Clear without exudates. No external lesions.  NECK: Supple. No thyromegaly. No nodules. No JVD.  PULMONARY: bilateral rhonchorous breath sounds worse on right CARDIOVASCULAR: S1 and S2. Regular rate and rhythm. No murmurs, rubs, or gallops. No edema. Pedal pulses 2+ bilaterally.  GASTROINTESTINAL: Soft, nontender, nondistended. No masses. Positive bowel sounds. No hepatosplenomegaly.  MUSCULOSKELETAL: No swelling, clubbing, or edema. Range of motion full in all extremities.  NEUROLOGIC: Cranial nerves II through XII are intact. No gross focal neurological deficits. Sensation intact. Reflexes intact.  SKIN: No ulceration, lesions, rashes, or cyanosis. Skin warm and dry. Turgor intact.  PSYCHIATRIC: Mood, affect within normal limits. The patient is awake, alert and oriented x 3. Insight, judgment intact.       IMAGING    Dg Chest 1 View  Result Date: 01/09/2019 CLINICAL DATA:  Cough, hyperglycemia EXAM: CHEST  1 VIEW COMPARISON:  01/02/2019 FINDINGS: Diffuse patchy opacities and interstitial prominence throughout the lungs, stable since prior study. Heart is normal size. No visible effusions or acute bony abnormality. IMPRESSION: Severe patchy opacities and interstitial prominence, unchanged since prior study. Electronically Signed   By: 01/04/2019 M.D.   On: 01/09/2019 23:44   Ct Chest Wo Contrast  Result Date: 01/03/2019 CLINICAL DATA:  Inpatient.  COVID-19 pneumonia. EXAM: CT CHEST WITHOUT CONTRAST TECHNIQUE: Multidetector CT imaging of the chest was performed following the standard protocol without IV contrast. COMPARISON:  Chest radiograph from one day prior. FINDINGS: Motion degraded scan, limiting assessment.  Cardiovascular: Normal heart size. No significant pericardial effusion/thickening. Normal course and caliber of the thoracic aorta. Top-normal caliber main pulmonary artery (3.2 cm diameter). Mediastinum/Nodes: No discrete thyroid nodules. Unremarkable esophagus. No axillary adenopathy. Mildly enlarged 1.1 cm subcarinal node (series 2/image 28). No discrete hilar adenopathy on this noncontrast scan. Lungs/Pleura: No pneumothorax. No pleural effusion. Moderate centrilobular and paraseptal emphysema. There is extensive patchy confluent crazy paving opacity throughout both lungs involving all lung lobes with involvement of the peripheral, perilobular and peribronchovascular lungs, without a clear apicobasilar gradient to these findings. No discrete lung masses or significant pulmonary nodules. Suggestion of mild parenchymal distortion and mild traction bronchiectasis throughout both lungs. Upper abdomen: No acute abnormality. Musculoskeletal: No aggressive appearing focal osseous lesions. Mild thoracic spondylosis. IMPRESSION: 1. Limited motion degraded scan. Extensive patchy confluent crazy paving opacity throughout both lungs with some associated parenchymal distortion and suggestion of mild traction bronchiectasis. Findings are compatible with severe COVID-19 pneumonia with suggestion of superimposed early postinflammatory  fibrosis. Consider follow-up high-resolution chest CT study in 3-6 months, as clinically warranted. 2. Mild subcarinal lymphadenopathy, nonspecific, presumably reactive, which can also be reassessed on follow-up chest CT. Emphysema (ICD10-J43.9). Electronically Signed   By: Ilona Sorrel M.D.   On: 01/03/2019 15:54   Ct Angio Chest Pe W/cm &/or Wo Cm  Result Date: 01/24/2019 CLINICAL DATA:  Shortness of breath.  Recent COVID-19 pneumonia EXAM: CT ANGIOGRAPHY CHEST WITH CONTRAST TECHNIQUE: Multidetector CT imaging of the chest was performed using the standard protocol during bolus  administration of intravenous contrast. Multiplanar CT image reconstructions and MIPs were obtained to evaluate the vascular anatomy. CONTRAST:  56mL OMNIPAQUE IOHEXOL 350 MG/ML SOLN COMPARISON:  Chest radiograph January 24, 2019 and chest CT January 03, 2019 FINDINGS: Cardiovascular: There is no demonstrable pulmonary embolus. There is no thoracic aortic aneurysm or dissection. Visualized great vessels appear unremarkable. Note that the right innominate and left common carotid arteries arise as a common trunk, an anatomic variant. No evident pericardial effusion or pericardial thickening. Main pulmonary artery measures 3.1 cm, borderline for pulmonary arterial hypertension. Mediastinum/Nodes: Thyroid appears unremarkable. There is a subcarinal lymph node measuring 1.7 x 1.3 cm, slightly increased in size compared to the previous study. There are scattered subcentimeter mediastinal lymph nodes elsewhere. There is a small hiatal hernia. Lungs/Pleura: There is underlying emphysematous change. There is extensive fibrosis throughout the lungs bilaterally. There are areas of associated atelectatic change. There is no frank airspace consolidation evident currently. There are no pleural effusions. There is a degree of lower lobe traction bronchiectasis. Upper Abdomen: There is mild reflux of contrast into the inferior vena cava and hepatic veins. Visualized upper abdominal structures otherwise appear unremarkable. Musculoskeletal: No blastic or lytic bone lesions. No chest wall lesions are evident. Review of the MIP images confirms the above findings. IMPRESSION: 1. No demonstrable pulmonary embolus. No thoracic aortic aneurysm or dissection. 2. Underlying emphysematous change with extensive fibrosis throughout the lungs bilaterally. Areas of atelectatic change noted. No well-defined airspace consolidation. A degree of residual viral pneumonitis is possible intermingled with this extensive fibrosis. There is also lower  lobe bronchiectatic change bilaterally. 3. Prominent subcarinal lymph node which potentially may be due to the parenchymal lung changes. No other frank adenopathy. 4. Borderline prominent main pulmonary outflow tract, a finding that may be indicative of a degree of underlying pulmonary arterial hypertension. 5. Mild reflux of contrast into the inferior vena cava and hepatic veins, a finding that may be indicative of a degree of increased right heart pressure. 6.  Small hiatal hernia. Emphysema (ICD10-J43.9). Electronically Signed   By: Lowella Grip III M.D.   On: 01/24/2019 10:15   Dg Chest Port 1 View  Result Date: 01/24/2019 CLINICAL DATA:  Shortness of breath. COVID-19 positive last month. EXAM: PORTABLE CHEST 1 VIEW COMPARISON:  01/09/2019 FINDINGS: The cardiomediastinal silhouette is unchanged. The lungs are hypoinflated. Widespread coarse interstitial and patchy airspace opacities throughout both lungs are unchanged. No sizable pleural effusion or pneumothorax is identified. No acute osseous abnormality is seen. IMPRESSION: Unchanged extensive interstitial and patchy airspace opacities bilaterally. Electronically Signed   By: Logan Bores M.D.   On: 01/24/2019 08:10   Dg Chest Portable 1 View  Result Date: 01/02/2019 CLINICAL DATA:  Short of breath EXAM: PORTABLE CHEST 1 VIEW COMPARISON:  12/17/2018 FINDINGS: Diffuse bilateral interstitial and ground-glass opacity with peripheral consolidations on the right. No pleural effusion. Normal heart size. No pneumothorax. IMPRESSION: Development of fairly extensive interstitial and ground-glass opacities, consistent with  multifocal pneumonia and provided clinical history of COVID-19 positivity. Electronically Signed   By: Jasmine Pang M.D.   On: 01/02/2019 18:05   Vas [place] Lower Extremity Venous (dvt)  Result Date: 01/04/2019  Lower Venous Study Indications: Covid with elevated d-dimer.  Comparison Study: No prior. Performing Technologist: Marilynne Halsted RDMS, RVT  Examination Guidelines: A complete evaluation includes B-mode imaging, spectral Doppler, color Doppler, and power Doppler as needed of all accessible portions of each vessel. Bilateral testing is considered an integral part of a complete examination. Limited examinations for reoccurring indications may be performed as noted.  +---------+---------------+---------+-----------+----------+--------------+  RIGHT     Compressibility Phasicity Spontaneity Properties Thrombus Aging  +---------+---------------+---------+-----------+----------+--------------+  CFV       Full            Yes       Yes                                    +---------+---------------+---------+-----------+----------+--------------+  SFJ       Full                                                             +---------+---------------+---------+-----------+----------+--------------+  FV Prox   Full                                                             +---------+---------------+---------+-----------+----------+--------------+  FV Mid    Full                                                             +---------+---------------+---------+-----------+----------+--------------+  FV Distal Full                                                             +---------+---------------+---------+-----------+----------+--------------+  PFV       Full                                                             +---------+---------------+---------+-----------+----------+--------------+  POP       Full            Yes       Yes                                    +---------+---------------+---------+-----------+----------+--------------+  PTV       Full                                                             +---------+---------------+---------+-----------+----------+--------------+  PERO      Full                                                             +---------+---------------+---------+-----------+----------+--------------+    +---------+---------------+---------+-----------+----------+--------------+  LEFT      Compressibility Phasicity Spontaneity Properties Thrombus Aging  +---------+---------------+---------+-----------+----------+--------------+  CFV       Full            Yes       Yes                                    +---------+---------------+---------+-----------+----------+--------------+  SFJ       Full                                                             +---------+---------------+---------+-----------+----------+--------------+  FV Prox   Full                                                             +---------+---------------+---------+-----------+----------+--------------+  FV Mid    Full                                                             +---------+---------------+---------+-----------+----------+--------------+  FV Distal Full                                                             +---------+---------------+---------+-----------+----------+--------------+  PFV       Full                                                             +---------+---------------+---------+-----------+----------+--------------+  POP       Full            Yes       Yes                                    +---------+---------------+---------+-----------+----------+--------------+  PTV       Full                                                             +---------+---------------+---------+-----------+----------+--------------+  PERO      Full                                                             +---------+---------------+---------+-----------+----------+--------------+     Summary: Right: There is no evidence of deep vein thrombosis in the lower extremity. No cystic structure found in the popliteal fossa. Left: There is no evidence of deep vein thrombosis in the lower extremity. No cystic structure found in the popliteal fossa.  *See table(s) above for measurements and observations. Electronically signed by  Waverly Ferrari MD on 01/04/2019 at 2:19:24 PM.    Final             ASSESSMENT/PLAN   Acute on chronic hypoxemic respiratory failure  -due to severe ARDS post COVID19 infection -appears to be developing changes consistent with fibrosis -patient is s/p hospitalization for acute COVID 19 infection -continue dexamethasone  po daily- plan for 10 days total -bactrim ss BID for PPX while on recurrent steroids for PCP prophylaxis -low dose lasix 20 mg po daily  -will consider low dose nacrotic for dyspnea if necessary    Centrilobular emphysema -DuoNeb - q6h PRN  -steroids as above  -IS at bedside     Atelectasis - bilateral worse at bases -metaneb q8h with nebulizer therapy -IS at bedside -encourage to use 10x/hr PT/OT evaluation   Sinus tachycardia   - likely compensatory due to labored breathing with dyspnea as well as partially due to anxiety  - will start low dose morphine trial     Thank you for allowing me to participate in the care of this patient.    Patient/Family are satisfied with care plan and all questions have been answered.   This document was prepared using Dragon voice recognition software and may include unintentional dictation errors.     Vida Rigger, M.D.  Division of Pulmonary & Critical Care Medicine  [facility] ([place])

## 2019-01-26 NOTE — Progress Notes (Addendum)
PROGRESS NOTE    Bryan Oconnor  DJM:[id number] DOB: 1950/10/22 DOA: 01/24/2019  PCP: System, Pcp Not In    LOS - 2   Brief Narrative:  68 y.o.malewith medical history significant for emphysema, hypertension, diabetes mellitus, CKD stage III, recent COVID-19 pneumonia with hospitalization through 10/12/2022to10/17/2020, hospitalized again from10/29/2020 to 01/07/2019,chronic hypoxemic respiratory failure on 3 L/min oxygen via nasal cannula at home.He presented to the hospital with worsening shortness of breath past 2 days as he neared the end of steroid taper, but had been ongoing since he was discharged from the hospital on 3L/min oxygen. Also reported a single episode of left-sided chest pain earlier that morning.  Patient checked O2 sat at home, it was 68%, prompting him to come to ED.  In the ED, patient requiring 6 L/min oxygen by high flow nasal cannula. CT of the chest was done but there was no evidence of acute pulmonary embolism or any other acute cardiothoracic abnormality. His troponin was elevated at 262so he was started on IV heparin infusion for possible NSTEMI. Admitted for further evaluation and management.  Cardiology consulted, recommend stop heparin, as troponin elevation likely do to demand ischemia in setting of hypoxia.  Pulmonary consulted for acute on chronic respiratory failure with hypoxia in patient with  post-COVID pulmonary fibrosis.  Started dexamethasone.  Patient requiring 6-8 L/min HFNC to maintain oxygen saturations.  Subjective 11/21: Patient awake, sitting up in bed.  Talking to his wife on the phone.  Placed on speaker phone for updates.  He reports feeling somewhat better today after steroid yesterday, but still very short of breath with talking or any movement.  Denies fevers or chills, reports mild chest pressure at times.  No acute events reported overnight.  Assessment & Plan:   Principal Problem:   Acute on chronic respiratory failure with  hypoxia (HCC) Active Problems:   Pulmonary fibrosis, postinflammatory (HCC)   Essential hypertension   Elevated troponin   COVID-19 virus detected   Emphysema of lung (HCC)   Sinus tachycardia   NSTEMI (non-ST elevated myocardial infarction) (HCC)   AKI (acute kidney injury) ([place])   Acute on chronic respiratory failure with hypoxia COPD/emphysema - not acutely exacerbated, no wheezing See above narrative.  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 -Pulmonary following -Dexamethasone 6 mg daily -Bactrim DS twice daily for PCP prophylaxis while on steroids -Hold today due to AKI - Lasix 20 mg daily -DuoNebs every 6 hours as needed -Albuterol every 4 hours as needed -Incentive spirometer -Supplemental oxygen to maintain O2 sats greater than 88%  Recent COVID Pneumonia  COVID-19 virus detected - patient's initial positive COVID-19 test was on 12/11/2018.  He subsequently had 2 admissions at [facility].  Since positive test > 28 days ago and patient without new fevers, this is most likely residual positive from prior infection.  Sinus tachycardia - improved.  Sustaining 100s to 110s today, down from 120s to 140s yesterday.  Most likely secondary to above.  CTA chest on admission was negative for PE.  Patient has been normotensive and afebrile, doubt sepsis or active infection.  Suspect this will improve as we treat the underlying problem. 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  Now with worsening hyperglycemia secondary to steroids.  A.m. blood glucose 452 this morning.  Give 10 units NovoLog adjust insulin regimen as below. -Hold oral agents -Increase Levemir from 12 to 15 units daily -Increased sliding scale to moderate  -Continue home gabapentin -Continue aspirin  Hypotension -Started on midodrine   Essential hypertension -currently with borderline blood pressures Does not appear to be on antihypertensives at home at this time.  Hyperlipidemia -continue home atorvastatin  GERD -continue Pepcid  BPH -continue finasteride   DVT prophylaxis: Lovenox   Code Status: Full Code  Family Communication: Wife by phone 11/21 while at bedside Disposition Plan: Pending clinical improvement and clearance by PCCM, expect discharge home  Consultants:   Pulmonary  Cardiology  Procedures:   None  Antimicrobials:   None   Objective: Vitals:   01/26/19 0016 01/26/19 0415 01/26/19 0748 01/26/19 1218  BP: (!) 125/97 132/89 122/87   Pulse: (!) 106 (!) 104 (!) 104 (!) 112  Resp: (!) 30 20 (!) 22 20  Temp: 98.2 F (36.8 C) 97.9 F (36.6 C) 97.6 F (36.4 C)   TempSrc: Oral Oral Oral   SpO2: 93% 93% 93%   Weight:  60.1 kg    Height:        Intake/Output Summary (Last 24 hours) at 01/26/2019 1518 Last data filed at 01/26/2019 0417 Gross per 24 hour  Intake --  Output 651 ml  Net -651 ml   Filed Weights   01/24/19 0713 01/24/19 1505 01/26/19 0415  Weight: 56.7 kg 60.3 kg 60.1 kg    Examination:  General exam: awake, alert, no acute distress,  HEENT: atraumatic, clear conjunctiva, anicteric sclera, moist mucus membranes, hearing grossly normal  Respiratory system: Exam limited by patient coughing triggered by inspirations, labored breathing with abdominal retractions, no wheezing,  conversational dyspnea Cardiovascular system: normal S1/S2, RRR, no JVD, murmurs, rubs, gallops, no pedal edema.   Gastrointestinal system: soft, non-tender, non-distended abdomen Central nervous system: alert and oriented x4. no gross focal neurologic deficits, normal speech Extremities: moves all, no edema, normal tone Psychiatry: normal mood, congruent affect, judgement and insight appear normal    Data Reviewed: I have personally reviewed following labs and imaging studies  CBC: Recent Labs  Lab 01/24/19 0720 01/25/19 0047 01/26/19 0458  WBC 7.7 5.1 3.1*  NEUTROABS 5.9  --  2.3  HGB 11.1* 10.5* 10.4*  HCT 34.5* 32.6* 31.6*  MCV 86.5 85.1 83.2  PLT 231 242 290   Basic Metabolic Panel: Recent Labs  Lab 01/24/19 0816 01/25/19 0047 01/26/19 0458  NA 136 135 134*  K 4.0 4.3 4.7  CL 99 99 100  CO2 22 23 21*  GLUCOSE 153* 161* 366*  BUN 21 19 39*  CREATININE 1.21 1.16 1.64*  CALCIUM 8.2* 8.1* 7.8*  MG  --   --  2.0   GFR: Estimated Creatinine Clearance: 36.6 mL/min (A) (by C-G formula based on SCr of 1.64 mg/dL (H)). Liver Function Tests: Recent Labs  Lab 01/24/19 0816  AST 21  ALT 21  ALKPHOS 54  BILITOT 0.8  PROT 7.2  ALBUMIN 2.7*   No results for input(s): LIPASE, AMYLASE in the last 168 hours. No results  for input(s): AMMONIA in the last 168 hours. Coagulation Profile: Recent Labs  Lab 01/24/19 0720  INR 1.0   Cardiac Enzymes: No results for input(s): CKTOTAL, CKMB, CKMBINDEX, TROPONINI in the last 168 hours. BNP (last 3 results) No results for input(s): PROBNP in the last 8760 hours. HbA1C: No results for input(s): HGBA1C in the last 72 hours. CBG: Recent Labs  Lab 01/25/19 1147 01/25/19 1612 01/25/19 2120 01/26/19 0749 01/26/19 1152  GLUCAP 178* 122* 256* 318* 452*   Lipid Profile: No results for input(s): CHOL, HDL, LDLCALC, TRIG, CHOLHDL, LDLDIRECT in the last 72 hours. Thyroid Function Tests: No results for input(s): TSH, T4TOTAL,  FREET4, T3FREE, THYROIDAB in the last 72 hours. Anemia Panel: Recent Labs    01/24/19 0816  FERRITIN 1,601*   Sepsis Labs: Recent Labs  Lab 01/24/19 0720 01/24/19 0816  PROCALCITON  --  0.22  LATICACIDVEN 1.8  --     Recent Results (from the past 240 hour(s))  Culture, blood (Routine x 2)     Status: None (Preliminary result)   Collection Time: 01/24/19  7:20 AM   Specimen: BLOOD  Result Value Ref Range Status   Specimen Description BLOOD BLOOD RIGHT HAND  Final   Special Requests   Final    BOTTLES DRAWN AEROBIC AND ANAEROBIC Blood Culture results may not be optimal due to an inadequate volume of blood received in culture bottles   Culture   Final    NO GROWTH 2 DAYS Performed at [facility], [address] [place], [place] [address]    Report Status PENDING  Incomplete  Culture, blood (Routine x 2)     Status: None (Preliminary result)   Collection Time: 01/24/19  7:21 AM   Specimen: BLOOD  Result Value Ref Range Status   Specimen Description BLOOD BLOOD LEFT HAND  Final   Special Requests   Final    BOTTLES DRAWN AEROBIC AND ANAEROBIC Blood Culture results may not be optimal due to an inadequate volume of blood received in culture bottles   Culture   Final    NO GROWTH 2 DAYS Performed at [facility], [address] [place], [place] [address]    Report Status PENDING  Incomplete  SARS CORONAVIRUS 2 (TAT 6-24 HRS) Nasopharyngeal Nasopharyngeal Swab     Status: Abnormal   Collection Time: 01/24/19  1:17 PM   Specimen: Nasopharyngeal Swab  Result Value Ref Range Status   SARS Coronavirus 2 POSITIVE (A) NEGATIVE Final    Comment: RESULT CALLED TO, READ BACK BY AND VERIFIED WITH: C MURRAY,RN 2015 01/24/2019 D BRADLEY (NOTE) SARS-CoV-2 target nucleic acids are DETECTED. The SARS-CoV-2 RNA is generally detectable in upper and lower respiratory specimens during the acute phase of infection. Positive results are indicative of active infection with  SARS-CoV-2. Clinical  correlation with patient history and other diagnostic information is necessary to determine patient infection status. Positive results do  not rule out bacterial infection or co-infection with other viruses. The expected result is Negative. Fact Sheet for Patients: HairSlick.nohttps://www.fda.gov/media/138098/download Fact Sheet for Healthcare Providers: quierodirigir.comhttps://www.fda.gov/media/138095/download This test is not yet approved or cleared by the [place] FDA and  has been authorized for detection and/or diagnosis of SARS-CoV-2 by FDA under an Emergency Use Authorization (EUA). This EUA will remain  in effect (meaning this test can be used) for  the duration of the COVID-19 declaration under Section 564(b)(1) of the Act, 21 U.S.C. section 360bbb-3(b)(1), unless the authorization is terminated or revoked sooner. Performed at  [facility], [address] [place], [place] [address]          Radiology Studies: No results found.      Scheduled Meds:  aspirin EC  81 mg Oral Daily   atorvastatin  40 mg Oral Daily   dexamethasone  6 mg Oral Daily   enoxaparin (LOVENOX) injection  40 mg Subcutaneous Q24H   famotidine  20 mg Oral Daily   feeding supplement (NEPRO CARB STEADY)  237 mL Oral TID BM   finasteride  5 mg Oral Daily   furosemide  20 mg Intravenous Daily   gabapentin  600 mg Oral QHS   insulin aspart  0-15 Units Subcutaneous TID WC   [START ON 01/27/2019] insulin detemir  15 Units Subcutaneous Daily   mouth rinse  15 mL Mouth Rinse BID   midodrine  5 mg Oral TID WC   multivitamin with minerals  1 tablet Oral Daily   sulfamethoxazole-trimethoprim  1 tablet Oral Q12H   Continuous Infusions:   LOS: 2 days    Time spent: 35-40 minutes    Pennie Banter, DO [facility] Pager: [phone]  If 7PM-7AM, please contact night-coverage [email or web address] Password [facility] 01/26/2019, 3:18 PM

## 2019-01-27 DIAGNOSIS — N179 Acute kidney failure, unspecified: Secondary | ICD-10-CM

## 2019-01-27 LAB — GLUCOSE, CAPILLARY
Glucose-Capillary: 353 mg/dL — ABNORMAL HIGH (ref 70–99)
Glucose-Capillary: 320 mg/dL — ABNORMAL HIGH (ref 70–99)
Glucose-Capillary: 211 mg/dL — ABNORMAL HIGH (ref 70–99)
Glucose-Capillary: 142 mg/dL — ABNORMAL HIGH (ref 70–99)

## 2019-01-27 LAB — CBC WITH DIFFERENTIAL/PLATELET
Abs Immature Granulocytes: 0.03 10*3/uL (ref 0.00–0.07)
Basophils Absolute: 0 10*3/uL (ref 0.0–0.1)
Basophils Relative: 0 %
Eosinophils Absolute: 0 10*3/uL (ref 0.0–0.5)
Eosinophils Relative: 0 %
HCT: 31.1 % — ABNORMAL LOW (ref 39.0–52.0)
Hemoglobin: 10 g/dL — ABNORMAL LOW (ref 13.0–17.0)
Immature Granulocytes: 1 %
Lymphocytes Relative: 19 %
Lymphs Abs: 1.1 10*3/uL (ref 0.7–4.0)
MCH: 27.4 pg (ref 26.0–34.0)
MCHC: 32.2 g/dL (ref 30.0–36.0)
MCV: 85.2 fL (ref 80.0–100.0)
Monocytes Absolute: 0.4 10*3/uL (ref 0.1–1.0)
Monocytes Relative: 7 %
Neutro Abs: 4.4 10*3/uL (ref 1.7–7.7)
Neutrophils Relative %: 73 %
Platelets: 369 10*3/uL (ref 150–400)
RBC: 3.65 MIL/uL — ABNORMAL LOW (ref 4.22–5.81)
RDW: 14.2 % (ref 11.5–15.5)
WBC: 6 10*3/uL (ref 4.0–10.5)
nRBC: 0 % (ref 0.0–0.2)

## 2019-01-27 LAB — BASIC METABOLIC PANEL
Anion gap: 12 (ref 5–15)
BUN: 53 mg/dL — ABNORMAL HIGH (ref 8–23)
CO2: 23 mmol/L (ref 22–32)
Calcium: 8.2 mg/dL — ABNORMAL LOW (ref 8.9–10.3)
Chloride: 101 mmol/L (ref 98–111)
Creatinine, Ser: 1.66 mg/dL — ABNORMAL HIGH (ref 0.61–1.24)
GFR calc Af Amer: 48 mL/min — ABNORMAL LOW (ref >60)
GFR calc non Af Amer: 42 mL/min — ABNORMAL LOW (ref >60)
Glucose, Bld: 359 mg/dL — ABNORMAL HIGH (ref 70–99)
Potassium: 5.4 mmol/L — ABNORMAL HIGH (ref 3.5–5.1)
Sodium: 136 mmol/L (ref 135–145)

## 2019-01-27 LAB — MAGNESIUM: Magnesium: 2.2 mg/dL (ref 1.7–2.4)

## 2019-01-27 LAB — POTASSIUM: Potassium: 4.3 mmol/L (ref 3.5–5.1)

## 2019-01-27 MED ORDER — INSULIN DETEMIR 100 UNIT/ML ~~LOC~~ SOLN
30.0000 [IU] | Freq: Every day | SUBCUTANEOUS | Status: DC
  Administered 2019-01-27 – 2019-01-30 (×4): 30 [IU] via SUBCUTANEOUS
  Filled 2019-01-27 (×5): qty 0.3

## 2019-01-27 MED ORDER — INSULIN ASPART 100 UNIT/ML ~~LOC~~ SOLN
10.0000 [IU] | Freq: Three times a day (TID) | SUBCUTANEOUS | Status: DC
  Administered 2019-01-27 (×2): 10 [IU] via SUBCUTANEOUS
  Filled 2019-01-27 (×2): qty 1

## 2019-01-27 MED ORDER — INSULIN ASPART 100 UNIT/ML ~~LOC~~ SOLN
20.0000 [IU] | Freq: Three times a day (TID) | SUBCUTANEOUS | Status: DC
  Administered 2019-01-27 – 2019-02-01 (×15): 20 [IU] via SUBCUTANEOUS
  Filled 2019-01-27 (×15): qty 1

## 2019-01-27 NOTE — Progress Notes (Addendum)
PROGRESS NOTE    Bryan Oconnor  ZOX:[id number] DOB: 01-Aug-1950 DOA: 01/24/2019  PCP: System, Pcp Not In    LOS - 3   Brief Narrative:  68 y.o.malewith medical history significant foremphysema,hypertension, diabetes mellitus, CKD stage III, recent COVID-19 pneumonia with hospitalization through 10/12/2022to10/17/2020, hospitalized again from10/29/2020 to 01/07/2019,chronic hypoxemic respiratory failure on 3 L/min oxygen via nasal cannula at home.He presented to the hospital with worsening shortness of breathpast 2 days as he neared the end of steroid taper, but had been ongoing since he wasdischarged from the hospitalon 3L/min oxygen.Also reported a single episode of left-sided chest pain earlier that morning. Patient checked O2 sat at home, it was 68%, prompting him to come to ED. In the ED, patient requiring 6 L/min oxygen by high flow nasal cannula. CT of the chest was done but there was no evidence of acute pulmonary embolism or any other acute cardiothoracic abnormality. His troponin was elevated at 262so he was started on IV heparin infusion forpossibleNSTEMI.Admitted for further evaluation and management. Cardiology consulted, recommend stop heparin, as troponin elevation likely do to demand ischemia in setting of hypoxia. Pulmonary consulted for acute on chronic respiratory failure with hypoxia in patient with post-COVID pulmonary fibrosis. Started dexamethasone. Patient requiring 6-8 L/min HFNC to maintain oxygen saturations.   Subjective 11/22: Patient sitting up in bed when seen and examined this morning.  Talked to his pastor on the phone who he placed on speaker said he could hear our conversation.  Patient reports slight improvement in his breathing but still quite dyspneic with any movement or with prolonged conversation.  He denies any other issues including fevers or chills, chest pain, nausea vomiting diarrhea.  No acute events reported overnight.   Assessment & Plan:   Principal Problem:   Acute on chronic respiratory failure with hypoxia (HCC) Active Problems:   Pulmonary fibrosis, postinflammatory (HCC)   Essential hypertension   Elevated troponin   COVID-19 virus detected   Emphysema of lung (HCC)   Sinus tachycardia   NSTEMI (non-ST elevated myocardial infarction) (HCC)   AKI (acute kidney injury) (HCC)   Acute on chronic respiratory failure with hypoxia COPD/emphysema - not acutely exacerbated, no wheezing See above narrative. 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 -Pulmonary following -Dexamethasone 6 mg daily x 10 days  -Bactrim DS twice daily for PCP prophylaxis while on steroids -Hold today due to AKI - Lasix 20 mg daily -DuoNebs every 6 hours as needed -Albuterol every 4 hours as needed -Incentive spirometer -Supplemental oxygen to maintain O2 sats greater than 88%  Recent COVID Pneumonia COVID-19 virus detected- patient's initial positive COVID-19 test was on 12/11/2018. He subsequently had 2 admissions at [facility]. Since positive test >28 days ago and patient without new fevers, this is most likelyresidual positive from prior infection.  Sinus tachycardia - resolved. Charted HR 88-93 today, down from 120s to 140s on admission.  Most likely secondary to above.  CTA chest on admission was negative for PE.  Patient has been normotensive and afebrile, doubt sepsis or active infection.  Suspect this will improve as we treat the underlying problem. 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  Now with worsening hyperglycemia secondary to steroids.  A.m. blood glucose 452 this morning.  Give 10 units NovoLog adjust insulin regimen as below. -Hold oral agents -Increase Levemir to 20 units daily -NovoLog 20 units 3 times daily with meals -Continue sliding scale moderate  -Continue home gabapentin -Continue aspirin  Hypotension -Started on midodrine   Essential hypertension-currently with borderline blood pressures Does not appear to be on antihypertensives at home at this time.  Hyperlipidemia-continue home atorvastatin  GERD-continue Pepcid  BPH-continue finasteride   DVT prophylaxis:Lovenox Code Status: Full Code Family Communication:Wife by phone 11/21 while at bedside Disposition Plan:Pending clinical improvement and clearance by PCCM, expect discharge home  Consultants:  Pulmonary  Cardiology  Procedures:  None  Antimicrobials:  None   Objective: Vitals:   01/26/19 1616 01/26/19 2055 01/27/19 0529 01/27/19 0725  BP: 110/70 97/78 114/83 110/89  Pulse: (!) 113 (!) 105 88 93  Resp: 20 20 20 18   Temp: (!) 97.3 F (36.3 C) 97.6 F (36.4 C) (!) 97.5 F (36.4 C) 97.6 F (36.4 C)  TempSrc: Oral Oral Oral Oral  SpO2: 91% 95% 96% 96%  Weight:   61 kg   Height:        Intake/Output Summary (Last 24 hours) at 01/27/2019 1334 Last data filed at 01/27/2019 0900 Gross per 24 hour  Intake 480 ml  Output 1975 ml  Net -1495 ml   Filed Weights   01/24/19 1505 01/26/19 0415 01/27/19 0529  Weight: 60.3 kg 60.1 kg 61 kg    Examination:  General exam: awake, alert, no acute distress Respiratory system: Bibasilar crackles, no wheezes or coarse rhonchi, abdominal retractions.  Cardiovascular system: normal S1/S2, RRR, no JVD, murmurs, rubs, gallops, no pedal edema.   Gastrointestinal system: soft, non-tender, non-distended abdomen Central nervous system: alert and oriented x4. no gross focal neurologic deficits, normal speech Extremities: moves all, no edema, normal tone Psychiatry: normal mood, congruent affect, judgement and insight appear normal    Data Reviewed: I have personally reviewed following labs and imaging studies  CBC: Recent Labs  Lab 01/24/19 0720 01/25/19 0047 01/26/19 0458 01/27/19 0450  WBC 7.7 5.1 3.1* 6.0  NEUTROABS 5.9  --  2.3 4.4  HGB 11.1* 10.5* 10.4* 10.0*  HCT 34.5* 32.6* 31.6* 31.1*  MCV 86.5 85.1 83.2 85.2  PLT 231 242 290 369   Basic Metabolic Panel: Recent Labs  Lab 01/24/19 0816 01/25/19 0047 01/26/19 0458 01/27/19 0450 01/27/19 0823  NA 136 135 134* 136  --   K 4.0 4.3 4.7 5.4* 4.3  CL 99 99 100 101  --   CO2 22 23 21* 23  --   GLUCOSE 153* 161* 366* 359*  --   BUN 21 19 39* 53*  --   CREATININE 1.21 1.16 1.64* 1.66*  --   CALCIUM 8.2* 8.1* 7.8* 8.2*  --   MG  --   --  2.0 2.2  --    GFR: Estimated Creatinine Clearance: 36.7 mL/min (A) (by C-G formula based on SCr of 1.66 mg/dL (H)). Liver Function Tests: Recent Labs  Lab 01/24/19 0816  AST 21  ALT 21  ALKPHOS 54  BILITOT 0.8  PROT 7.2  ALBUMIN 2.7*   No results for input(s): LIPASE, AMYLASE  in the last 168 hours. No results for input(s): AMMONIA in the last 168 hours. Coagulation Profile: Recent Labs  Lab 01/24/19 0720  INR 1.0   Cardiac Enzymes: No results for input(s): CKTOTAL, CKMB, CKMBINDEX, TROPONINI in the last 168 hours. BNP (last 3 results) No results for input(s): PROBNP in the last 8760 hours. HbA1C: No results for input(s): HGBA1C in the last 72 hours. CBG: Recent Labs  Lab 01/26/19 1152 01/26/19 1659 01/26/19 2107 01/27/19 0725 01/27/19 1133  GLUCAP 452* 363* 365* 353* 320*   Lipid Profile: No results for  input(s): CHOL, HDL, LDLCALC, TRIG, CHOLHDL, LDLDIRECT in the last 72 hours. Thyroid Function Tests: No results for input(s): TSH, T4TOTAL, FREET4, T3FREE, THYROIDAB in the last 72 hours. Anemia Panel: No results for input(s): VITAMINB12, FOLATE, FERRITIN, TIBC, IRON, RETICCTPCT in the last 72 hours. Sepsis Labs: Recent Labs  Lab 01/24/19 0720 01/24/19 0816  PROCALCITON  --  0.22  LATICACIDVEN 1.8  --     Recent Results (from the past 240 hour(s))  Culture, blood (Routine x 2)     Status: None (Preliminary result)   Collection Time: 01/24/19  7:20 AM   Specimen: BLOOD  Result Value Ref Range Status   Specimen Description BLOOD BLOOD RIGHT HAND  Final   Special Requests   Final    BOTTLES DRAWN AEROBIC AND ANAEROBIC Blood Culture results may not be optimal due to an inadequate volume of blood received in culture bottles   Culture   Final    NO GROWTH 3 DAYS Performed at [facility], [address] [place], [place] [address]    Report Status PENDING  Incomplete  Culture, blood (Routine x 2)     Status: None (Preliminary result)   Collection Time: 01/24/19  7:21 AM   Specimen: BLOOD  Result Value Ref Range Status   Specimen Description BLOOD BLOOD LEFT HAND  Final   Special Requests   Final    BOTTLES DRAWN AEROBIC AND ANAEROBIC Blood Culture results may not be optimal due to an inadequate volume of blood received in culture bottles   Culture   Final    NO GROWTH 3 DAYS Performed at [facility], [address] [place], [place] [address]    Report Status PENDING  Incomplete  SARS CORONAVIRUS 2 (TAT 6-24 HRS) Nasopharyngeal Nasopharyngeal Swab     Status: Abnormal   Collection Time: 01/24/19  1:17 PM   Specimen: Nasopharyngeal Swab  Result Value Ref Range Status   SARS Coronavirus 2 POSITIVE (A) NEGATIVE Final    Comment: RESULT CALLED TO, READ BACK BY AND VERIFIED WITH: C MURRAY,RN 2015 01/24/2019 D BRADLEY (NOTE) SARS-CoV-2 target nucleic acids are  DETECTED. The SARS-CoV-2 RNA is generally detectable in upper and lower respiratory specimens during the acute phase of infection. Positive results are indicative of active infection with SARS-CoV-2. Clinical  correlation with patient history and other diagnostic information is necessary to determine patient infection status. Positive results do  not rule out bacterial infection or co-infection with other viruses. The expected result is Negative. Fact Sheet for Patients: HairSlick.nohttps://www.fda.gov/media/138098/download Fact Sheet for Healthcare Providers: quierodirigir.comhttps://www.fda.gov/media/138095/download This test is not yet approved or cleared by the [place] FDA and  has been authorized for detection and/or diagnosis of SARS-CoV-2 by FDA under an Emergency Use Authorization (EUA). This EUA will remain  in effect (meaning this test can be used) for  the duration of the COVID-19 declaration under Section 564(b)(1) of the Act, 21 U.S.C. section 360bbb-3(b)(1),  unless the authorization is terminated or revoked sooner. Performed at [facility], [place] [address] [place], [place] [address]          Radiology Studies: No results found.      Scheduled Meds: . aspirin EC  81 mg Oral Daily  . atorvastatin  40 mg Oral Daily  . dexamethasone  6 mg Oral Daily  . enoxaparin (LOVENOX) injection  40 mg Subcutaneous Q24H  . famotidine  20 mg Oral Daily  . feeding supplement (NEPRO CARB STEADY)  237 mL Oral TID BM  . finasteride  5 mg Oral Daily  . gabapentin  600 mg Oral QHS  . insulin aspart  0-15 Units Subcutaneous TID WC  . insulin aspart  20 Units Subcutaneous TID WC  . insulin detemir  30 Units Subcutaneous Daily  . mouth rinse  15 mL Mouth Rinse BID  . midodrine  5 mg Oral TID WC  . multivitamin with minerals  1 tablet Oral Daily  . sulfamethoxazole-trimethoprim  1 tablet Oral Q12H   Continuous Infusions:   LOS: 3 days    Time spent: 25-30 minutes    Ezekiel Slocumb,  DO [facility] Pager: [phone]  If 7PM-7AM, please contact night-coverage [email or web address] Password [facility] 01/27/2019, 1:34 PM

## 2019-01-27 NOTE — Plan of Care (Signed)
Breathing easier with less accessory muscle use; no air hunger noted.  In good spirits and states he is "feeling better".   Problem: Education: Goal: Knowledge of General Education information will improve Description: Including pain rating scale, medication(s)/side effects and non-pharmacologic comfort measures Outcome: Progressing   Problem: Health Behavior/Discharge Planning: Goal: Ability to manage health-related needs will improve Outcome: Progressing   Problem: Clinical Measurements: Goal: Ability to maintain clinical measurements within normal limits will improve Outcome: Progressing Goal: Will remain free from infection Outcome: Progressing Goal: Diagnostic test results will improve Outcome: Progressing Goal: Respiratory complications will improve Outcome: Progressing Goal: Cardiovascular complication will be avoided Outcome: Progressing   Problem: Activity: Goal: Risk for activity intolerance will decrease Outcome: Progressing   Problem: Nutrition: Goal: Adequate nutrition will be maintained Outcome: Progressing   Problem: Coping: Goal: Level of anxiety will decrease Outcome: Progressing   Problem: Elimination: Goal: Will not experience complications related to bowel motility Outcome: Progressing Goal: Will not experience complications related to urinary retention Outcome: Progressing   Problem: Pain Managment: Goal: General experience of comfort will improve Outcome: Progressing   Problem: Safety: Goal: Ability to remain free from injury will improve Outcome: Progressing   Problem: Skin Integrity: Goal: Risk for impaired skin integrity will decrease Outcome: Progressing   Problem: Education: Goal: Understanding of cardiac disease, CV risk reduction, and recovery process will improve Outcome: Progressing Goal: Individualized Educational Video(s) Outcome: Progressing   Problem: Activity: Goal: Ability to tolerate increased activity will  improve Outcome: Progressing   Problem: Cardiac: Goal: Ability to achieve and maintain adequate cardiovascular perfusion will improve Outcome: Progressing   Problem: Health Behavior/Discharge Planning: Goal: Ability to safely manage health-related needs after discharge will improve Outcome: Progressing

## 2019-01-27 NOTE — Progress Notes (Signed)
PT Cancellation Note  Patient Details Name: Bryan Oconnor MRN: [id number] DOB: 06-18-1950   Cancelled Treatment:    Reason Eval/Treat Not Completed: Medical issues which prohibited therapy , glucose 353, pot 5.4 , will attempt tomorrow.   [address], [place], [place] DPT 01/27/2019, 12:05 PM

## 2019-01-28 LAB — BASIC METABOLIC PANEL
Anion gap: 10 (ref 5–15)
BUN: 50 mg/dL — ABNORMAL HIGH (ref 8–23)
CO2: 25 mmol/L (ref 22–32)
Calcium: 8.6 mg/dL — ABNORMAL LOW (ref 8.9–10.3)
Chloride: 104 mmol/L (ref 98–111)
Creatinine, Ser: 1.54 mg/dL — ABNORMAL HIGH (ref 0.61–1.24)
GFR calc Af Amer: 53 mL/min — ABNORMAL LOW (ref >60)
GFR calc non Af Amer: 46 mL/min — ABNORMAL LOW (ref >60)
Glucose, Bld: 193 mg/dL — ABNORMAL HIGH (ref 70–99)
Potassium: 5.1 mmol/L (ref 3.5–5.1)
Sodium: 139 mmol/L (ref 135–145)

## 2019-01-28 LAB — CBC
HCT: 31.5 % — ABNORMAL LOW (ref 39.0–52.0)
Hemoglobin: 10.4 g/dL — ABNORMAL LOW (ref 13.0–17.0)
MCH: 27.4 pg (ref 26.0–34.0)
MCHC: 33 g/dL (ref 30.0–36.0)
MCV: 83.1 fL (ref 80.0–100.0)
Platelets: 454 10*3/uL — ABNORMAL HIGH (ref 150–400)
RBC: 3.79 MIL/uL — ABNORMAL LOW (ref 4.22–5.81)
RDW: 14.3 % (ref 11.5–15.5)
WBC: 7.7 10*3/uL (ref 4.0–10.5)
nRBC: 0 % (ref 0.0–0.2)

## 2019-01-28 LAB — GLUCOSE, CAPILLARY
Glucose-Capillary: 170 mg/dL — ABNORMAL HIGH (ref 70–99)
Glucose-Capillary: 111 mg/dL — ABNORMAL HIGH (ref 70–99)
Glucose-Capillary: 271 mg/dL — ABNORMAL HIGH (ref 70–99)
Glucose-Capillary: 299 mg/dL — ABNORMAL HIGH (ref 70–99)

## 2019-01-28 LAB — MAGNESIUM: Magnesium: 2.3 mg/dL (ref 1.7–2.4)

## 2019-01-28 MED ORDER — SODIUM CHLORIDE 0.9% FLUSH
10.0000 mL | Freq: Two times a day (BID) | INTRAVENOUS | Status: DC
  Administered 2019-01-28 – 2019-02-06 (×19): 10 mL via INTRAVENOUS

## 2019-01-28 NOTE — Evaluation (Signed)
Physical Therapy Evaluation Patient Details Name: Bryan Oconnor MRN: [id number] DOB: [birth year] Today's Date: 01/28/2019   History of Present Illness  Bryan Oconnor is a 68yoM who comes to [facility] after worsening SOB. Pt was hospitalized at [facility] twice for COVID-19 10/12-10/17, then 10/29-11/2. Pt has been at home with O2 since.  Clinical Impression  Pt admitted with above diagnosis. Pt currently with functional limitations due to the deficits listed below (see "PT Problem List"). Upon entry, pt in bed, awake and agreeable to participate. Pt received on 5L/min HFNC @ 85%, moved to 6L/min HFNC at 89%, then moved to 8L/min @ 93%, all resting in bed. Performed bed mobility to EOB and 5xSTS on 8L/min c terminal SpO2 @ 79% 4/4 dyspnea. Pt moved to next setting on O2 tank (8L->15L) with 60-75sec recovery back to >88%. Pt AMB slowly to door and back on 15L/min HFNC c terminal SpO2 88%. Pt left in room on 8L/min 86% pt appears comfortable. The pt is alert and oriented x4, pleasant, conversational, and generally a good historian. Functional mobility assessment demonstrates minimally increased effort/time requirements, poor tolerance, but no need for physical assistance, whereas the patient performed these at a higher level of independence PTA. Pt AMB very slowly as to maintain breathing in a comfortable level, all while on 15L/min. Pt will benefit from skilled PT intervention to increase independence and safety with basic mobility in preparation for discharge to the venue listed below.       Follow Up Recommendations Home health PT    Equipment Recommendations  None recommended by PT    Recommendations for Other Services       Precautions / Restrictions Precautions Precautions: None Precaution Comments: HFNC for activity Restrictions Weight Bearing Restrictions: No      Mobility  Bed Mobility Overal bed mobility: Independent                Transfers Overall transfer level:  Independent               General transfer comment: 5xSTS on 8L/min, c termial SpO2: 79%, pt moved to 15L/min for 60-75sec recovery back to >88%.  Ambulation/Gait Ambulation/Gait assistance: Supervision Gait Distance (Feet): 35 Feet Assistive device: None Gait Pattern/deviations: Step-through pattern Gait velocity: slow adn cautious <0.40m/s Gait velocity interpretation: <1.31 ft/sec, indicative of household ambulator General Gait Details: very slow, over to door adn back to recliner on 15L/min HFNC termianl SpO2 88%.  Stairs            Wheelchair Mobility    Modified Rankin (Stroke Patients Only)       Balance Overall balance assessment: No apparent balance deficits (not formally assessed);Independent                                           Pertinent Vitals/Pain Pain Assessment: No/denies pain    Home Living Family/patient expects to be discharged to:: Private residence Living Arrangements: Spouse/significant other Available Help at Discharge: Family Type of Home: House Home Access: Stairs to enter   Secretary/administrator of Steps: 3 Home Layout: One level Home Equipment: None      Prior Function Level of Independence: Independent               Hand Dominance   Dominant Hand: Right    Extremity/Trunk Assessment   Upper Extremity Assessment Upper Extremity Assessment: Overall WFL for tasks assessed  Lower Extremity Assessment Lower Extremity Assessment: Overall WFL for tasks assessed       Communication   Communication: No difficulties  Cognition Arousal/Alertness: Awake/alert Behavior During Therapy: WFL for tasks assessed/performed Overall Cognitive Status: Within Functional Limits for tasks assessed                                        General Comments      Exercises     Assessment/Plan    PT Assessment Patient needs continued PT services  PT Problem List Decreased activity  tolerance;Cardiopulmonary status limiting activity       PT Treatment Interventions Gait training;Functional mobility training;Therapeutic activities;Therapeutic exercise    PT Goals (Current goals can be found in the Care Plan section)  Acute Rehab PT Goals Patient Stated Goal: have less DOE/SOB wth basic ADL PT Goal Formulation: With patient Time For Goal Achievement: 02/11/19 Potential to Achieve Goals: Fair    Frequency Min 2X/week   Barriers to discharge        Co-evaluation               AM-PAC PT "6 Clicks" Mobility  Outcome Measure Help needed turning from your back to your side while in a flat bed without using bedrails?: None Help needed moving from lying on your back to sitting on the side of a flat bed without using bedrails?: None Help needed moving to and from a bed to a chair (including a wheelchair)?: None Help needed standing up from a chair using your arms (e.g., wheelchair or bedside chair)?: None Help needed to walk in hospital room?: A Little Help needed climbing 3-5 steps with a railing? : A Little 6 Click Score: 22    End of Session Equipment Utilized During Treatment: Oxygen Activity Tolerance: Patient tolerated treatment well;Patient limited by fatigue;Treatment limited secondary to medical complications (Comment)(pt desaturates easily c slow recovery) Patient left: in chair;with call bell/phone within reach Nurse Communication: Mobility status PT Visit Diagnosis: Unsteadiness on feet (R26.81);Difficulty in walking, not elsewhere classified (R26.2)    Time: 2458-0998 PT Time Calculation (min) (ACUTE ONLY): 28 min   Charges:   PT Evaluation $PT Eval Low Complexity: 1 Low PT Treatments $Therapeutic Exercise: 8-22 mins        12:33 PM, 01/28/19 Etta Grandchild, PT, DPT Physical Therapist - [facility]  [phone] ([place])    Zhoe Catania C 01/28/2019, 12:29 PM

## 2019-01-28 NOTE — Evaluation (Signed)
Occupational Therapy Evaluation Patient Details Name: Bryan Oconnor MRN: [id number] DOB: 1950-08-04 Today's Date: 01/28/2019    History of Present Illness Bryan Oconnor is a 31yoM who comes to [facility] after worsening SOB. Pt was hospitalized at [facility] twice for COVID-19 10/12-10/17, then 10/29-11/2. Pt has been at home with O2 since.   Clinical Impression   Bryan Oconnor was seen for OT evaluation this date. Prior to initial illness, pt was independent in all ADLs and mobility, living in a 1 story home with his spouse. Pt reports he was living an active lifestyle, and enjoys carpentry tasks and doing work around his home. Pt on 3 liters of O2 at home and reports becoming easily fatigued or out of breath with minimal exertion. Pt currently requires minimal assist for exertional ADL tasks including bathing and lower body dressing due to poor activity tolerance in increased SOB. Pt educated in energy conservation conservation strategies including pursed lip breathing, activity pacing, home/routines modifications, work simplification, AE/DME, prioritizing of meaningful occupations, and falls prevention. Handout provided. Pt verbalized understanding but would benefit from additional skilled OT services to maximize recall and carryover of learned techniques and facilitate implementation of learned techniques into daily routines. Upon discharge, recommend [place] services.       Follow Up Recommendations  Home health OT    Equipment Recommendations  3 in 1 bedside commode    Recommendations for Other Services       Precautions / Restrictions Precautions Precautions: Fall Precaution Comments: Moderate fall; HFNC for activity Restrictions Weight Bearing Restrictions: No      Mobility Bed Mobility Overal bed mobility: Independent                Transfers Overall transfer level: Independent                   Balance Overall balance assessment: No apparent balance deficits (not  formally assessed)                                         ADL either performed or assessed with clinical judgement   ADL                                         General ADL Comments: Pt near baseline for ADL management, however experiences significant drop in SpO2 with functional mobility and exertional movements. Would benefit from continued education in energy conservation strategies and ways to implement strategies into functional activity.     Vision Baseline Vision/History: Wears glasses Wears Glasses: At all times Patient Visual Report: No change from baseline       Perception     Praxis      Pertinent Vitals/Pain Pain Assessment: No/denies pain     Hand Dominance Right   Extremity/Trunk Assessment Upper Extremity Assessment Upper Extremity Assessment: Generalized weakness(Pt grip and FMC WNL. [place] decreased and gnerally weak.)   Lower Extremity Assessment Lower Extremity Assessment: Defer to PT evaluation;Overall WFL for tasks assessed       Communication Communication Communication: No difficulties   Cognition Arousal/Alertness: Awake/alert Behavior During Therapy: WFL for tasks assessed/performed Overall Cognitive Status: Within Functional Limits for tasks assessed  General Comments  Pt SpO2 while seated in room recliner at start of session on 8L HFNC noted to be 92-94%. When pt cued to utilize PLB strategy SpO2 notably improved to 97% on 8L HFNC.    Exercises Other Exercises Other Exercises: Pt educated on safety, falls prevention strategies, safe use of AE/DME for ADL management, and energy conservation strategies including pursed lip breathing and the 4 Ps this date. ECS handout provided. Pt would benefit from review.   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 Will have someone check for it.      Prior Functioning/Environment Level of Independence: Independent                 OT Problem List: Decreased strength;Decreased coordination;Cardiopulmonary status limiting activity;Decreased activity tolerance;Decreased safety awareness;Decreased knowledge of use of DME or AE      OT Treatment/Interventions: Self-care/ADL training;Therapeutic exercise;Therapeutic activities;Energy conservation;DME and/or AE instruction;Patient/family education    OT Goals(Current goals can be found in the care plan section) Acute Rehab OT Goals Patient Stated Goal: have less DOE/SOB wth basic ADL OT Goal Formulation: With patient Time For Goal Achievement: 02/11/19 Potential to Achieve Goals: Good ADL Goals Pt Will Perform Grooming: sitting;with modified independence Pt Will Perform Upper Body Bathing: sitting;with modified independence(With LRAD PRN for improved safety and functional independence.) Pt Will Perform Lower Body Bathing: sitting/lateral leans;with adaptive equipment;sit to/from stand;with modified independence(With LRAD PRN for improved safety and functional independence.) Additional ADL Goal #1: Pt will independently verbalize a plan to implement at least 3 learned energy conservation strategies into his daily routines/home environment for improved safety and functional independence upon hosptial DC.  OT Frequency: Min 2X/week   Barriers to D/C:            Co-evaluation              AM-PAC OT "6  Clicks" Daily Activity     Outcome Measure Help from another person eating meals?: None Help from another person taking care of personal grooming?: None Help from another person toileting, which includes using toliet, bedpan, or urinal?: A Little Help from another person bathing (including washing, rinsing, drying)?: A Little Help from another person to put on and taking off regular upper body clothing?: None Help from another person to put on and taking off regular lower body clothing?: A Little 6 Click Score: 21   End of Session    Activity Tolerance: Patient tolerated treatment well Patient left: in chair;with call bell/phone within reach;with chair alarm set;Other (comment)(With HFNC in place at start/end of session.)  OT Visit Diagnosis: Other abnormalities of gait and mobility (R26.89)                Time: 4081-4481 OT Time Calculation (min): 31 min Charges:  OT General Charges $OT Visit: 1 Visit OT Evaluation $OT Eval Moderate Complexity: 1 Mod OT Treatments $Self Care/Home Management : 8-22 mins  Rockney Ghee, M.S., OTR/L Ascom: [phone] 01/28/19, 1:06 PM

## 2019-01-28 NOTE — Progress Notes (Signed)
PROGRESS NOTE    Bryan Oconnor  YNW:[id number] DOB: 12-21-1950 DOA: 01/24/2019  PCP: System, Pcp Not In    LOS - 4   Brief Narrative:  68 y.o.malewith medical history significant foremphysema,hypertension, diabetes mellitus, CKD stage III, recent COVID-19 pneumonia with hospitalization through 10/12/2022to10/17/2020, hospitalized again from10/29/2020 to 01/07/2019,chronic hypoxemic respiratory failure on 3 L/min oxygen via nasal cannula at home.He presented to the hospital with worsening shortness of breathpast 2 days as he neared the end of steroid taper, but had been ongoing since he wasdischarged from the hospitalon 3L/min oxygen.Also reported a single episode of left-sided chest pain earlier that morning. Patient checked O2 sat at home, it was 68%, prompting him to come to ED. In the ED, patient requiring 6 L/min oxygen by high flow nasal cannula. CT of the chest was done but there was no evidence of acute pulmonary embolism or any other acute cardiothoracic abnormality. His troponin was elevated at 262so he was started on IV heparin infusion forpossibleNSTEMI.Admitted for further evaluation and management. Cardiology consulted, recommend stop heparin, as troponin elevation likely do to demand ischemia in setting of hypoxia. Pulmonary consulted for acute on chronic respiratory failure with hypoxia in patient with post-COVID pulmonary fibrosis. Started dexamethasone. Patient requiring 6-8 L/min HFNC to maintain oxygen saturations.   Subjective 11/23: Patient with acute on his wife, seen this morning.  No acute events reported overnight.  He states feeling steadily better each day.  This morning was able to sit up edge of bed and days, was less short of breath.  Denies fevers or chills, nausea vomiting diarrhea or other acute complaints.  Assessment & Plan:   Principal Problem:   Acute on chronic respiratory failure with hypoxia (HCC) Active Problems:  Pulmonary fibrosis, postinflammatory (HCC)   Essential hypertension   Elevated troponin   COVID-19 virus detected   Emphysema of lung (HCC)   Sinus tachycardia   NSTEMI (non-ST elevated myocardial infarction) (HCC)   AKI (acute kidney injury) (HCC)   Acute on chronic respiratory failure with hypoxia COPD/emphysema-not acutely exacerbated, no wheezing See above narrative. 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 -Pulmonary following -Dexamethasone 6 mg daily x 10 days  -Bactrim DS twice daily for PCP prophylaxis while on steroids -Hold today due to AKI-Lasix 20 mg daily -DuoNebs every 6 hours as needed -Albuterol every 4 hours as needed -Incentive spirometer -Supplemental oxygen to maintain O2 sats greater than 88%  Recent COVID Pneumonia COVID-19 virus detected- patient's initial positive COVID-19 test was on 12/11/2018. He subsequently had 2 admissions at [facility]. Since positive test >28 days ago and patient without new fevers, this is most likelyresidual positive from prior infection.  Sinus tachycardia-resolved. Charted [phone] today, down from 120s to 140s on admission. Most likely secondary to above. CTA chest on admission was negative for PE. Patient has been normotensive and afebrile, doubt sepsis or active infection. Suspect this will improve as we treat the underlying problem. 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 A.m. blood glucose 452 this morning. Give 10 units NovoLog adjust insulin regimen as below. -Hold oral agents -IncreaseLevemir to 20units daily -NovoLog 20 units 3 times daily with meals -Continue sliding scale moderate  -Continue home gabapentin -Continue aspirin  Hypotension -Started on midodrine   Essential hypertension-currently with borderline blood pressures Does not appear to be on antihypertensives at home at this time.  Hyperlipidemia-continue home atorvastatin  GERD-continue Pepcid  BPH-continue finasteride   DVT prophylaxis:Lovenox Code Status: Full Code Family Communication:Wife by phone 11/23while at bedside Disposition Plan:Pending clinical improvementand clearance by PCCM, expect dischargehome  Consultants:  Pulmonary  Cardiology  Procedures:  None  Antimicrobials:  None   Objective: Vitals:   01/27/19 0725 01/27/19 1455 01/27/19 2007 01/28/19 0523  BP: 110/89 136/73 (!) 141/83 125/82  Pulse: 93 88 (!) 109 97  Resp: 18 19 18 18   Temp: 97.6 F (36.4 C) (!) 97.4 F (36.3 C) 97.7 F (36.5 C) (!) 97.5 F (36.4 C)  TempSrc: Oral Oral Oral Oral  SpO2: 96% 90% 91% 90%  Weight:    60.9 kg  Height:        Intake/Output Summary (Last 24 hours) at 01/28/2019 0748 Last data filed at 01/28/2019 0540 Gross per 24 hour  Intake 720 ml  Output 1825 ml  Net -1105 ml   Filed Weights   01/26/19 0415 01/27/19 0529 01/28/19 0523  Weight: 60.1 kg 61 kg 60.9 kg    Examination:  General exam: awake, alert, no acute distress HEENT: moist mucus membranes, hearing grossly normal  Respiratory system: Bilateral crackles, no wheezes or rhonchi, work of breathing appears somewhat improved with less apparent abdominal retractions Cardiovascular  system: normal S1/S2, RRR, no JVD, murmurs, rubs, gallops, no pedal edema.   Gastrointestinal system: soft, non-tender, non-distended abdomen Central nervous system: alert and oriented x4. no gross focal neurologic deficits, normal speech Extremities: moves all, no edema, normal tone Skin: dry, intact, normal temperature Psychiatry: normal mood, congruent affect, judgement and insight appear normal    Data Reviewed: I have personally reviewed following labs and imaging studies  CBC: Recent Labs  Lab 01/24/19 0720 01/25/19 0047 01/26/19 0458 01/27/19 0450 01/28/19 0518  WBC 7.7 5.1 3.1* 6.0 7.7  NEUTROABS 5.9  --  2.3 4.4  --   HGB 11.1* 10.5* 10.4* 10.0* 10.4*  HCT 34.5* 32.6* 31.6* 31.1* 31.5*  MCV 86.5 85.1 83.2 85.2 83.1  PLT 231 242 290 369 454*   Basic Metabolic Panel: Recent Labs  Lab 01/24/19 0816 01/25/19 0047 01/26/19 0458 01/27/19 0450 01/27/19 0823 01/28/19 0518  NA 136 135 134* 136  --  139  K 4.0 4.3 4.7 5.4* 4.3 5.1  CL 99 99 100 101  --  104  CO2 22 23 21* 23  --  25  GLUCOSE 153* 161* 366* 359*  --  193*  BUN 21 19 39* 53*  --  50*  CREATININE 1.21 1.16 1.64* 1.66*  --  1.54*  CALCIUM 8.2* 8.1* 7.8* 8.2*  --  8.6*  MG  --   --  2.0 2.2  --  2.3   GFR: Estimated Creatinine Clearance: 39.5 mL/min (A) (by C-G formula based on SCr of 1.54 mg/dL (H)). Liver Function Tests: Recent Labs  Lab 01/24/19 0816  AST 21  ALT 21  ALKPHOS 54  BILITOT 0.8  PROT 7.2  ALBUMIN 2.7*   No results for input(s): LIPASE, AMYLASE in the  last 168 hours. No results for input(s): AMMONIA in the last 168 hours. Coagulation Profile: Recent Labs  Lab 01/24/19 0720  INR 1.0   Cardiac Enzymes: No results for input(s): CKTOTAL, CKMB, CKMBINDEX, TROPONINI in the last 168 hours. BNP (last 3 results) No results for input(s): PROBNP in the last 8760 hours. HbA1C: No results for input(s): HGBA1C in the last 72 hours. CBG: Recent Labs  Lab 01/26/19 2107 01/27/19  0725 01/27/19 1133 01/27/19 1628 01/27/19 2110  GLUCAP 365* 353* 320* 211* 142*   Lipid Profile: No results for input(s): CHOL, HDL, LDLCALC, TRIG, CHOLHDL, LDLDIRECT in the last 72 hours. Thyroid Function Tests: No results for input(s): TSH, T4TOTAL, FREET4, T3FREE, THYROIDAB in the last 72 hours. Anemia Panel: No results for input(s): VITAMINB12, FOLATE, FERRITIN, TIBC, IRON, RETICCTPCT in the last 72 hours. Sepsis Labs: Recent Labs  Lab 01/24/19 0720 01/24/19 0816  PROCALCITON  --  0.22  LATICACIDVEN 1.8  --     Recent Results (from the past 240 hour(s))  Culture, blood (Routine x 2)     Status: None (Preliminary result)   Collection Time: 01/24/19  7:20 AM   Specimen: BLOOD  Result Value Ref Range Status   Specimen Description BLOOD BLOOD RIGHT HAND  Final   Special Requests   Final    BOTTLES DRAWN AEROBIC AND ANAEROBIC Blood Culture results may not be optimal due to an inadequate volume of blood received in culture bottles   Culture   Final    NO GROWTH 3 DAYS Performed at [facility], [address] [place], [place] [address]    Report Status PENDING  Incomplete  Culture, blood (Routine x 2)     Status: None (Preliminary result)   Collection Time: 01/24/19  7:21 AM   Specimen: BLOOD  Result Value Ref Range Status   Specimen Description BLOOD BLOOD LEFT HAND  Final   Special Requests   Final    BOTTLES DRAWN AEROBIC AND ANAEROBIC Blood Culture results may not be optimal due to an inadequate volume of blood received in culture bottles   Culture   Final    NO GROWTH 3 DAYS Performed at [facility], [address] [place], [place] [address]    Report Status PENDING  Incomplete  SARS CORONAVIRUS 2 (TAT 6-24 HRS) Nasopharyngeal Nasopharyngeal Swab     Status: Abnormal   Collection Time: 01/24/19  1:17 PM   Specimen: Nasopharyngeal Swab  Result Value Ref Range Status   SARS Coronavirus 2 POSITIVE (A) NEGATIVE Final    Comment: RESULT CALLED  TO, READ BACK BY AND VERIFIED WITH: C MURRAY,RN 2015 01/24/2019 D BRADLEY (NOTE) SARS-CoV-2 target nucleic acids are DETECTED. The SARS-CoV-2 RNA is generally detectable in upper and lower respiratory specimens during the acute phase of infection. Positive results are indicative of active infection with SARS-CoV-2. Clinical  correlation with patient history and other diagnostic information is necessary to determine patient infection status. Positive results do  not rule out bacterial infection or co-infection with other viruses. The expected result is Negative. Fact Sheet for Patients: HairSlick.no Fact Sheet for Healthcare Providers: quierodirigir.com This test is not yet approved or cleared by the [place] FDA and  has been authorized for detection and/or diagnosis of SARS-CoV-2 by FDA under an Emergency Use Authorization (EUA). This EUA will remain  in effect (meaning this test can be used) for  the duration of the COVID-19 declaration under Section 564(b)(1) of the Act, 21 U.S.C. section 360bbb-3(b)(1), unless the  authorization is terminated or revoked sooner. Performed at [facility], [place] [address], [place], [place] [address]          Radiology Studies: No results found.      Scheduled Meds: . aspirin EC  81 mg Oral Daily  . atorvastatin  40 mg Oral Daily  . dexamethasone  6 mg Oral Daily  . enoxaparin (LOVENOX) injection  40 mg Subcutaneous Q24H  . famotidine  20 mg Oral Daily  . feeding supplement (NEPRO CARB STEADY)  237 mL Oral TID BM  . finasteride  5 mg Oral Daily  . gabapentin  600 mg Oral QHS  . insulin aspart  0-15 Units Subcutaneous TID WC  . insulin aspart  20 Units Subcutaneous TID WC  . insulin detemir  30 Units Subcutaneous Daily  . mouth rinse  15 mL Mouth Rinse BID  . midodrine  5 mg Oral TID WC  . multivitamin with minerals  1 tablet Oral Daily  . sulfamethoxazole-trimethoprim   1 tablet Oral Q12H   Continuous Infusions:   LOS: 4 days    Time spent: 25-30 minutes    Ezekiel Slocumb, DO [facility] Pager: [phone]  If 7PM-7AM, please contact night-coverage [email or web address] Password [facility] 01/28/2019, 7:48 AM

## 2019-01-28 NOTE — Progress Notes (Signed)
CCMD called to notify patient has a run of SVT with heart rate as high as 158. Patient asymptomatic without any complaints. MD notified.

## 2019-01-29 LAB — GLUCOSE, CAPILLARY
Glucose-Capillary: 254 mg/dL — ABNORMAL HIGH (ref 70–99)
Glucose-Capillary: 213 mg/dL — ABNORMAL HIGH (ref 70–99)
Glucose-Capillary: 85 mg/dL (ref 70–99)
Glucose-Capillary: 384 mg/dL — ABNORMAL HIGH (ref 70–99)

## 2019-01-29 LAB — BASIC METABOLIC PANEL
Anion gap: 8 (ref 5–15)
BUN: 49 mg/dL — ABNORMAL HIGH (ref 8–23)
CO2: 25 mmol/L (ref 22–32)
Calcium: 8.9 mg/dL (ref 8.9–10.3)
Chloride: 101 mmol/L (ref 98–111)
Creatinine, Ser: 1.52 mg/dL — ABNORMAL HIGH (ref 0.61–1.24)
GFR calc Af Amer: 54 mL/min — ABNORMAL LOW (ref >60)
GFR calc non Af Amer: 46 mL/min — ABNORMAL LOW (ref >60)
Glucose, Bld: 278 mg/dL — ABNORMAL HIGH (ref 70–99)
Potassium: 5.2 mmol/L — ABNORMAL HIGH (ref 3.5–5.1)
Sodium: 134 mmol/L — ABNORMAL LOW (ref 135–145)

## 2019-01-29 LAB — MAGNESIUM: Magnesium: 2 mg/dL (ref 1.7–2.4)

## 2019-01-29 MED ORDER — SULFAMETHOXAZOLE-TRIMETHOPRIM 400-80 MG PO TABS
1.0000 | ORAL_TABLET | Freq: Every day | ORAL | Status: DC
  Administered 2019-01-30 – 2019-02-07 (×9): 1 via ORAL
  Filled 2019-01-29 (×9): qty 1

## 2019-01-29 MED ORDER — FUROSEMIDE 10 MG/ML IJ SOLN
20.0000 mg | Freq: Once | INTRAMUSCULAR | Status: AC
  Administered 2019-01-29: 20 mg via INTRAVENOUS
  Filled 2019-01-29: qty 2

## 2019-01-29 MED ORDER — INSULIN ASPART 100 UNIT/ML ~~LOC~~ SOLN
6.0000 [IU] | Freq: Once | SUBCUTANEOUS | Status: AC
  Administered 2019-01-29: 6 [IU] via SUBCUTANEOUS
  Filled 2019-01-29: qty 1

## 2019-01-29 NOTE — TOC Initial Note (Addendum)
Transition of Care [facility]) - Initial/Assessment Note    Patient Details  Name: Bryan Oconnor MRN: [id number] Date of Birth: 02-23-1951  [facility]) CM/SW Contact:    Candie Chroman, LCSW Phone Number: 01/29/2019, 1:06 PM  Clinical Narrative: Readmission prevention screen complete. CSW met with patient. No supports at bedside. CSW introduced role and explained that PT recommendations would be discussed. Patient agreeable to home health. CSW provided CMS scores for agencies that serve his zip code. First preference is [facility]. They are only able to provide therapy right now. It may be a week or more before they were able to send a nurse. Will see if any other agencies can accommodate his needs. [facility] is following patient for possible trilogy and [facility]. Patient is home with his wife. He has oxygen at home through [facility] per chart review. His has a PCP (Dr. Nicki Reaper). Pharmacy is [facility] on [address]. No issues obtaining medications. His wife drives him as needed. No further concerns. CSW encouraged patient to contact CSW as needed. CSW will continue to follow patient for support and facilitate return home when stable.         3:26 pm: [facility], [facility], [facility], [facility], [facility] [facility], and [facility] are unable to accept patient for home health, mostly due to nurse staffing issues. [facility] will see if they can manage.      4:17 pm: [facility] can accept patient for PT, OT, RN. They cannot start services until Monday or Tuesday.      Expected Discharge Plan: [place]     Patient Goals and CMS Choice   CMS Medicare.gov Compare Post Acute Care list provided to:: Patient    Expected Discharge Plan and Services Expected Discharge Plan: [place] Choice: [place] arrangements for the past 2 months: Single Family Home                                      Prior Living  Arrangements/Services Living arrangements for the past 2 months: Single Family Home Lives with:: Spouse Patient language and need for interpreter reviewed:: Yes Do you feel safe going back to the place where you live?: Yes      Need for Family Participation in Patient Care: Yes (Comment) Care giver support system in place?: Yes (comment) Current home services: DME Criminal Activity/Legal Involvement Pertinent to Current Situation/Hospitalization: No - Comment as needed  Activities of Daily Living Home Assistive Devices/Equipment: Oxygen ADL Screening (condition at time of admission) Patient's cognitive ability adequate to safely complete daily activities?: Yes Is the patient deaf or have difficulty hearing?: No Does the patient have difficulty seeing, even when wearing glasses/contacts?: No Does the patient have difficulty concentrating, remembering, or making decisions?: No Patient able to express need for assistance with ADLs?: Yes Does the patient have difficulty dressing or bathing?: No Independently performs ADLs?: Yes (appropriate for developmental age) Does the patient have difficulty walking or climbing stairs?: No Weakness of Legs: None Weakness of Arms/Hands: None  Permission Sought/Granted Permission sought to share information with : Facility Art therapist granted to share information with : Yes, Verbal Permission Granted     Permission granted to share info w AGENCY: Home Health Agencies        Emotional Assessment Appearance:: Appears stated age Attitude/Demeanor/Rapport: Engaged, Gracious Affect (typically observed): Accepting,  Appropriate, Calm, Pleasant Orientation: : Oriented to Self, Oriented to Place, Oriented to  Time, Oriented to Situation Alcohol / Substance Use: Not Applicable Psych Involvement: No (comment)  Admission diagnosis:  Shortness of breath [R06.02] Hypoxia [R09.02] NSTEMI (non-ST elevated myocardial infarction) [facility])  [I21.4] Patient Active Problem List   Diagnosis Date Noted  . Sinus tachycardia 01/26/2019  . AKI (acute kidney injury) ([place]) 01/26/2019  . COVID-19 virus detected 01/25/2019  . Pulmonary fibrosis, postinflammatory ([place]) 01/25/2019  . Acute on chronic respiratory failure with hypoxia ([place]) 01/25/2019  . Emphysema of lung ([place]) 01/25/2019  . NSTEMI (non-ST elevated myocardial infarction) ([place]) 01/24/2019  . Elevated troponin 01/24/2019  . Acute hypoxemic respiratory failure ([place]) 01/03/2019  . CKD stage 3 secondary to diabetes ([place]) 01/03/2019  . COVID-19 virus infection 12/17/2018  . Hyponatremia 12/17/2018  . Essential hypertension 12/17/2018  . Uncontrolled diabetes mellitus with hyperglycemia ([place]) 12/17/2018  . Viral pneumonia 12/17/2018  . Pneumonia due to COVID-19 virus 12/17/2018  . Acute renal failure superimposed on stage 3 chronic kidney disease ([place]) 12/16/2018   PCP:  System, Pcp Not In Pharmacy:   [facility] (N), [place] [address] [place]) [place] [address] Phone: [phone] Fax: [phone]     Social Determinants of Health (SDOH) Interventions    Readmission Risk Interventions Readmission Risk Prevention Plan 01/29/2019  Transportation Screening Complete  HRI or [place] Complete  Social Work Consult for [place] Planning/Counseling Complete  Palliative Care Screening Not Applicable  Medication Review Press photographer) Complete  Some recent data might be hidden

## 2019-01-29 NOTE — Plan of Care (Signed)
  Problem: Education: Goal: Knowledge of General Education information will improve Description: Including pain rating scale, medication(s)/side effects and non-pharmacologic comfort measures Outcome: Progressing   Problem: Clinical Measurements: Goal: Respiratory complications will improve Outcome: Not Progressing Note: Patient is having dyspnea with minimal exertion. HFNC at 8 liters

## 2019-01-29 NOTE — Progress Notes (Signed)
PROGRESS NOTE    Bryan Oconnor  EAV:[id number] DOB: 1951-01-15 DOA: 01/24/2019  PCP: System, Pcp Not In    LOS - 5   Brief Narrative:  68 y.o.malewith medical history significant foremphysema,hypertension, diabetes mellitus, CKD stage III, recent COVID-19 pneumonia with hospitalization through 10/12/2022to10/17/2020, hospitalized again from10/29/2020 to 01/07/2019,chronic hypoxemic respiratory failure on 3 L/min oxygen via nasal cannula at home.He presented to the hospital with worsening shortness of breathpast 2 days as he neared the end of steroid taper, but had been ongoing since he wasdischarged from the hospitalon 3L/min oxygen.Also reported a single episode of left-sided chest pain earlier that morning. Patient checked O2 sat at home, it was 68%, prompting him to come to ED. In the ED, patient requiring 6 L/min oxygen by high flow nasal cannula. CT of the chest was done but there was no evidence of acute pulmonary embolism or any other acute cardiothoracic abnormality. His troponin was elevated at 262so he was started on IV heparin infusion forpossibleNSTEMI.Admitted for further evaluation and management. Cardiology consulted, recommend stop heparin, as troponin elevation likely do to demand ischemia in setting of hypoxia. Pulmonary consulted for acute on chronic respiratory failure with hypoxia in patient with post-COVID pulmonary fibrosis. Started dexamethasone. Patient requiring 5-8 L/min HFNC to maintain oxygen saturations.  Subjective 11/24: Patient awake, sitting up in bed after breakfast.  Reports breathing continues to improve a little each day.  Feels like he tolerates standing and moving around in the bed or to chair with less dyspnea.  Discussed need to continue weaning O2 to level that can be managed at home, or may need SNF for high O2 requirements until it improves.  States his goal is to return home.  Assessment & Plan:   Principal Problem:  Acute on chronic respiratory failure with hypoxia (HCC) Active Problems:   Pulmonary fibrosis, postinflammatory (HCC)   Essential hypertension   Elevated troponin   COVID-19 virus detected   Emphysema of lung (HCC)   Sinus tachycardia   NSTEMI (non-ST elevated myocardial infarction) (HCC)   AKI (acute kidney injury) (HCC)   Acute on chronic respiratory failure with hypoxia secondary to post-Covid pulmonary fibrosis with prior Covid pneumonia and ARDS COPD/emphysema-not acutely exacerbated, no wheezing See above narrative. 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 -Pulmonary following -Dexamethasone 6 mg dailyx 10 days, no taper afterward -Bactrim DS twice daily for PCP prophylaxis while on steroids -Hold today due to AKI-Lasix 20 mg daily -DuoNebs every 6 hours as needed -Albuterol every 4 hours as needed -Incentive spirometer -Supplemental oxygen to maintain O2 sats greater than 88% --May require SNF placement if unable to wean down O2 to level manageable at home --Pulmonary rehab after d/c  Recent COVID Pneumonia COVID-19 virus detected- patient's initial positive COVID-19 test was on 12/11/2018. He subsequently had 2 admissions at [facility]. Since positive test >28 days ago and patient without new fevers, this is most likelyresidual positive from prior infection.  No need for airborne precautions.  Sinus tachycardia-resolved.Charted HR80's today, down from 120s to 140son admission. Most likely secondary to above. CTA chest on admission was negative for PE. Patient has been normotensive and afebrile, doubt sepsis or active infection. Suspect this will improve as we treat the underlying problem. 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 A.m. blood glucose 452 this morning. Give 10 units NovoLog adjust insulin regimen as below. -Hold oral agents -IncreaseLevemirto 20units daily -NovoLog 20 units 3 times daily with meals -Continuesliding scale moderate  -Continue home gabapentin -Continue aspirin  Hypotension -Started on midodrine   Essential hypertension-currently with borderline blood pressures Does not appear to be on antihypertensives at home at this time.  Hyperlipidemia-continue home atorvastatin  GERD-continue Pepcid  BPH-continue finasteride   DVT prophylaxis:Lovenox Code Status: Full Code Family Communication:Wife by phone 11/23while at bedside Disposition Plan:Pending clinical improvementand clearance by PCCM, expect dischargehome  Consultants:  Pulmonary  Cardiology  Procedures:  None  Antimicrobials:  None   Objective: Vitals:   01/28/19 1549 01/28/19 2113 01/28/19 2121 01/29/19 0456  BP: 129/85  128/85 127/81  Pulse: (!) 102  86 83  Resp: 18  20 18   Temp: 97.9 F (36.6 C)  97.8 F (36.6 C) 97.8 F (36.6 C)  TempSrc:   Oral Oral  SpO2: 93% 98% 100% 100%  Weight:    62.3 kg  Height:        Intake/Output Summary (Last 24 hours) at 01/29/2019 0741 Last data filed at 01/29/2019 0501 Gross per 24 hour  Intake -  Output 1850 ml  Net -1850 ml   Filed Weights   01/27/19 0529 01/28/19 0523 01/29/19 0456  Weight: 61 kg 60.9 kg 62.3 kg     Examination:  General exam: awake, alert, no acute distress, underweight HEENT: clear conjunctiva, anicteric sclera, moist mucus membranes, hearing grossly normal  Respiratory system: crackles bilaterally, no wheezes or rhonchi, improved respiratory effort without abdominal retractions today. Cardiovascular system: normal S1/S2, RRR, no JVD, murmurs, rubs, gallops, no pedal edema.   Gastrointestinal system: soft, non-tender, non-distended abdomen. Central nervous system: alert and oriented x4. no gross focal neurologic deficits, normal speech Extremities: moves all, no edema, normal tone Skin: dry, intact, normal temperature Psychiatry: normal mood, congruent affect, judgement and insight appear normal    Data Reviewed: I have personally reviewed following labs and imaging studies  CBC: Recent Labs  Lab 01/24/19 0720 01/25/19 0047 01/26/19 0458 01/27/19 0450 01/28/19 0518  WBC 7.7 5.1 3.1* 6.0 7.7  NEUTROABS 5.9  --  2.3 4.4  --   HGB 11.1* 10.5* 10.4* 10.0* 10.4*  HCT 34.5* 32.6* 31.6* 31.1* 31.5*  MCV 86.5 85.1 83.2 85.2 83.1  PLT 231 242 290 369 454*   Basic Metabolic Panel: Recent Labs  Lab 01/25/19 0047 01/26/19 0458 01/27/19 0450 01/27/19 0823 01/28/19 0518 01/29/19 0431  NA 135 134* 136  --  139 134*  K 4.3 4.7 5.4* 4.3 5.1 5.2*  CL 99 100 101  --  104 101  CO2 23 21* 23  --  25 25  GLUCOSE 161* 366* 359*  --  193* 278*  BUN 19 39* 53*  --  50* 49*  CREATININE 1.16 1.64* 1.66*  --  1.54* 1.52*  CALCIUM 8.1* 7.8* 8.2*  --  8.6* 8.9  MG  --  2.0 2.2  --  2.3 2.0   GFR: Estimated Creatinine Clearance: 41 mL/min (A) (by C-G formula based on SCr  of 1.52 mg/dL (H)). Liver Function Tests: Recent Labs  Lab 01/24/19 0816  AST 21  ALT 21  ALKPHOS 54  BILITOT 0.8  PROT 7.2  ALBUMIN 2.7*   No results for input(s): LIPASE, AMYLASE in the last 168 hours. No results for input(s): AMMONIA in the last 168 hours. Coagulation Profile: Recent Labs  Lab  01/24/19 0720  INR 1.0   Cardiac Enzymes: No results for input(s): CKTOTAL, CKMB, CKMBINDEX, TROPONINI in the last 168 hours. BNP (last 3 results) No results for input(s): PROBNP in the last 8760 hours. HbA1C: No results for input(s): HGBA1C in the last 72 hours. CBG: Recent Labs  Lab 01/27/19 2110 01/28/19 0821 01/28/19 1229 01/28/19 1627 01/28/19 2118  GLUCAP 142* 170* 111* 271* 299*   Lipid Profile: No results for input(s): CHOL, HDL, LDLCALC, TRIG, CHOLHDL, LDLDIRECT in the last 72 hours. Thyroid Function Tests: No results for input(s): TSH, T4TOTAL, FREET4, T3FREE, THYROIDAB in the last 72 hours. Anemia Panel: No results for input(s): VITAMINB12, FOLATE, FERRITIN, TIBC, IRON, RETICCTPCT in the last 72 hours. Sepsis Labs: Recent Labs  Lab 01/24/19 0720 01/24/19 0816  PROCALCITON  --  0.22  LATICACIDVEN 1.8  --     Recent Results (from the past 240 hour(s))  Culture, blood (Routine x 2)     Status: None (Preliminary result)   Collection Time: 01/24/19  7:20 AM   Specimen: BLOOD  Result Value Ref Range Status   Specimen Description BLOOD BLOOD RIGHT HAND  Final   Special Requests   Final    BOTTLES DRAWN AEROBIC AND ANAEROBIC Blood Culture results may not be optimal due to an inadequate volume of blood received in culture bottles   Culture   Final    NO GROWTH 4 DAYS Performed at [facility], [address], [place], [place] [address]    Report Status PENDING  Incomplete  Culture, blood (Routine x 2)     Status: None (Preliminary result)   Collection Time: 01/24/19  7:21 AM   Specimen: BLOOD  Result Value Ref Range Status   Specimen Description BLOOD BLOOD LEFT HAND  Final   Special Requests   Final    BOTTLES DRAWN AEROBIC AND ANAEROBIC Blood Culture results may not be optimal due to an inadequate volume of blood received in culture bottles   Culture   Final    NO GROWTH 4 DAYS Performed at [facility], [address]  [place], [place] [address]    Report Status PENDING  Incomplete  SARS CORONAVIRUS 2 (TAT 6-24 HRS) Nasopharyngeal Nasopharyngeal Swab     Status: Abnormal   Collection Time: 01/24/19  1:17 PM   Specimen: Nasopharyngeal Swab  Result Value Ref Range Status   SARS Coronavirus 2 POSITIVE (A) NEGATIVE Final    Comment: RESULT CALLED TO, READ BACK BY AND VERIFIED WITH: C MURRAY,RN 2015 01/24/2019 D BRADLEY (NOTE) SARS-CoV-2 target nucleic acids are DETECTED. The SARS-CoV-2 RNA is generally detectable in upper and lower respiratory specimens during the acute phase of infection. Positive results are indicative of active infection with SARS-CoV-2. Clinical  correlation with patient history and other diagnostic information is necessary to determine patient infection status. Positive results do  not rule out bacterial infection or co-infection with other viruses. The expected result is Negative. Fact Sheet for Patients: SugarRoll.be Fact Sheet for Healthcare Providers: https://www.woods-mathews.com/ This test is not yet approved or cleared by the [place] FDA and  has been authorized for detection and/or diagnosis of SARS-CoV-2  by FDA under an Emergency Use Authorization (EUA). This EUA will remain  in effect (meaning this test can be used) for  the duration of the COVID-19 declaration under Section 564(b)(1) of the Act, 21 U.S.C. section 360bbb-3(b)(1), unless the authorization is terminated or revoked sooner. Performed at [facility], [address] [place], [place] [address]          Radiology Studies: No results found.      Scheduled Meds: . aspirin EC  81 mg Oral Daily  . atorvastatin  40 mg Oral Daily  . dexamethasone  6 mg Oral Daily  . enoxaparin (LOVENOX) injection  40 mg Subcutaneous Q24H  . famotidine  20 mg Oral Daily  . feeding supplement (NEPRO CARB STEADY)  237 mL Oral TID BM  . finasteride  5 mg Oral Daily  .  gabapentin  600 mg Oral QHS  . insulin aspart  0-15 Units Subcutaneous TID WC  . insulin aspart  20 Units Subcutaneous TID WC  . insulin detemir  30 Units Subcutaneous Daily  . mouth rinse  15 mL Mouth Rinse BID  . midodrine  5 mg Oral TID WC  . multivitamin with minerals  1 tablet Oral Daily  . sodium chloride flush  10 mL Intravenous Q12H  . sulfamethoxazole-trimethoprim  1 tablet Oral Q12H   Continuous Infusions:   LOS: 5 days    Time spent: 30-35 minutes    Pennie BanterKelly A Sinai Mahany, DO [facility] Pager: [phone]  If 7PM-7AM, please contact night-coverage [email or web address] Password [facility] 01/29/2019, 7:41 AM

## 2019-01-29 NOTE — Progress Notes (Signed)
OT Cancellation Note  Patient Details Name: Bryan Oconnor MRN: [id number] DOB: [birth year]   Cancelled Treatment:    Reason Eval/Treat Not Completed: Other (comment). Per chart, Pt with K+ of 5.2 falling outside guidelines for participation in therapy. Will re-attempt OT tx next date as medically appropriate.   Jeni Salles, MPH, MS, OTR/L ascom [phone] 01/29/19, 4:22 PM

## 2019-01-30 LAB — BASIC METABOLIC PANEL
Anion gap: 12 (ref 5–15)
BUN: 46 mg/dL — ABNORMAL HIGH (ref 8–23)
CO2: 23 mmol/L (ref 22–32)
Calcium: 9.1 mg/dL (ref 8.9–10.3)
Chloride: 102 mmol/L (ref 98–111)
Creatinine, Ser: 1.32 mg/dL — ABNORMAL HIGH (ref 0.61–1.24)
GFR calc Af Amer: >60 mL/min (ref >60)
GFR calc non Af Amer: 55 mL/min — ABNORMAL LOW (ref >60)
Glucose, Bld: 224 mg/dL — ABNORMAL HIGH (ref 70–99)
Potassium: 4.4 mmol/L (ref 3.5–5.1)
Sodium: 137 mmol/L (ref 135–145)

## 2019-01-30 LAB — GLUCOSE, CAPILLARY
Glucose-Capillary: 175 mg/dL — ABNORMAL HIGH (ref 70–99)
Glucose-Capillary: 214 mg/dL — ABNORMAL HIGH (ref 70–99)
Glucose-Capillary: 225 mg/dL — ABNORMAL HIGH (ref 70–99)
Glucose-Capillary: 210 mg/dL — ABNORMAL HIGH (ref 70–99)

## 2019-01-30 LAB — MAGNESIUM: Magnesium: 2 mg/dL (ref 1.7–2.4)

## 2019-01-30 MED ORDER — INSULIN DETEMIR 100 UNIT/ML ~~LOC~~ SOLN
35.0000 [IU] | Freq: Every day | SUBCUTANEOUS | Status: DC
  Administered 2019-01-31 – 2019-02-02 (×3): 35 [IU] via SUBCUTANEOUS
  Filled 2019-01-30 (×3): qty 0.35

## 2019-01-30 NOTE — Plan of Care (Signed)
  Problem: Health Behavior/Discharge Planning: Goal: Ability to manage health-related needs will improve Outcome: Progressing   Problem: Clinical Measurements: Goal: Ability to maintain clinical measurements within normal limits will improve Outcome: Progressing   Problem: Clinical Measurements: Goal: Respiratory complications will improve Outcome: Not Progressing Note: Patient remains dyspneic with minimal exertion.

## 2019-01-30 NOTE — Progress Notes (Signed)
Inpatient Diabetes Program Recommendations  AACE/ADA: New Consensus Statement on Inpatient Glycemic Control (2015)  Target Ranges:  Prepandial:   less than 140 mg/dL      Peak postprandial:   less than 180 mg/dL (1-2 hours)      Critically ill patients:  140 - 180 mg/dL   Lab Results  Component Value Date   GLUCAP 175 (H) 01/30/2019   HGBA1C 11.5 (H) 01/03/2019    Review of Glycemic Control Results for TEMITAYO, COVALT (MRN [id number]) as of 01/30/2019 10:39  Ref. Range 01/29/2019 09:11 01/29/2019 11:15 01/29/2019 16:28 01/29/2019 20:33 01/30/2019 07:40  Glucose-Capillary Latest Ref Range: 70 - 99 mg/dL 254(H) Novolog 28units + Levemir 30units 213 (H) Novolog 25units 85 384 (H) Novolog 6units 175 (H) Novolog 23 units +  Levemir 23units    Diabetes history: DM2 Outpatient Diabetes medications: Metformin 1000mg  BID + Glucotrol 10mg  QD Current orders for Inpatient glycemic control: Levemir 30 units with meals + Novolog 20 units with meals + Novolog 0-15 TID with meals + Decadron 6mg  QD  Inpatient Diabetes Program Recommendations:  -Levemir 20 units BID -Novolog 22 units with meals if eats 50% of meals -Novolog 0-5 HS  Thank you, Geoffry Paradise, RN, BSN Diabetes Coordinator Inpatient Diabetes Program [phone] (team pager from 8a-5p)

## 2019-01-30 NOTE — Care Management Important Message (Signed)
Important Message  Patient Details  Name: Bryan Oconnor MRN: [id number] Date of Birth: February 24, 1951   Medicare Important Message Given:  Yes     Dannette Barbara 01/30/2019, 12:06 PM

## 2019-01-30 NOTE — Progress Notes (Signed)
Nutrition Follow Up Note   DOCUMENTATION CODES:   Not applicable  INTERVENTION:   Nepro Shake po TID, each supplement provides 425 kcal and 19 grams protein  MVI daily   NUTRITION DIAGNOSIS:   Unintentional weight loss related to acute illness(COVID 19) as evidenced by 20 percent weight loss in 1-2 months.  GOAL:   Patient will meet greater than or equal to 90% of their needs  MONITOR:   PO intake, Supplement acceptance, Labs, Weight trends, Skin, I & O's  ASSESSMENT:   68 y.o. male with hx of HTN, DM, CKD, recent COVID diagnosis requiring admission (diagnosed 10/5, admitted 10/12 to 10/17), with resultant post-COVID lung fibrosis (readmitted 10/29 to 11/2) now on 2 L [place] who presents to the emergency department for worsening shortness of breath.   Pt with improving appetite and oral intake; pt eating eating 100% of meals and drinking Nepro. Recommend contiuue supplements and MVI. Per chart, pt up ~5lbs since admit.   Medications reviewed and include: aspirin, dexamethasone, lovenox, pepcid, insulin, heparin, MVI, bactrim  Labs reviewed: BUN 46(H), creat 1.32(H), Mg 2.0 wnl Hgb 10.4(L), Hct 31.5(L)- 11/23 cbgs- 384, 175, 214 x hrs  Diet Order:   Diet Order            Diet Carb Modified Fluid consistency: Thin; Room service appropriate? Yes  Diet effective now             EDUCATION NEEDS:   Not appropriate for education at this time  Skin:  Skin Assessment: Reviewed RN Assessment  Last BM:  11/24- type 4  Height:   Ht Readings from Last 1 Encounters:  01/24/19 5\' 6"  (1.676 m)    Weight:   Wt Readings from Last 1 Encounters:  01/29/19 62.3 kg    Ideal Body Weight:  59 kg  BMI:  Body mass index is 22.18 kg/m.  Estimated Nutritional Needs:   Kcal:  1800-2100kcal/day  Protein:  90-105g/day  Fluid:  >1.5L/day  Koleen Distance MS, RD, LDN Pager #- [phone] Office#- [phone] After Hours Pager: [phone]

## 2019-01-30 NOTE — TOC Progression Note (Signed)
Transition of Care [facility]) - Progression Note    Patient Details  Name: Bryan Oconnor MRN: [id number] Date of Birth: 1950/03/12  Transition of Care [facility]) CM/SW [place], LCSW Phone Number: 01/30/2019, 2:39 PM  Clinical Narrative: Notified patient that Alvis Lemmings will follow up with him for his home health services. He is agreeable.    Expected Discharge Plan: [place]    Expected Discharge Plan and Services Expected Discharge Plan: [place] Choice: [place] arrangements for the past 2 months: Single Family Home                                       Social Determinants of Health (SDOH) Interventions    Readmission Risk Interventions Readmission Risk Prevention Plan 01/29/2019  Transportation Screening Complete  HRI or Home Care Consult Complete  Social Work Consult for [place] Planning/Counseling Complete  Palliative Care Screening Not Applicable  Medication Review Press photographer) Complete  Some recent data might be hidden

## 2019-01-30 NOTE — Progress Notes (Signed)
PROGRESS NOTE    Bryan Oconnor  ZOX:[id number] DOB: May 03, 1950 DOA: 01/24/2019 PCP: System, Pcp Not In    Brief Narrative:  68 y.o.malewith medical history significant foremphysema,hypertension, diabetes mellitus, CKD stage III, recent COVID-19 pneumonia with hospitalization through 10/12/2022to10/17/2020, hospitalized again from10/29/2020 to 01/07/2019,chronic hypoxemic respiratory failure on 3 L/min oxygen via nasal cannula at home.He presented to the hospital with worsening shortness of breathpast 2 days as he neared the end of steroid taper, but had been ongoing since he wasdischarged from the hospitalon 3L/min oxygen.Also reported a single episode of left-sided chest pain earlier that morning. Patient checked O2 sat at home, it was 68%, prompting him to come to ED. In the ED, patient requiring 6 L/min oxygen by high flow nasal cannula. CT of the chest was done but there was no evidence of acute pulmonary embolism or any other acute cardiothoracic abnormality. His troponin was elevated at 262so he was started on IV heparin infusion forpossibleNSTEMI.Admitted for further evaluation and management. Cardiology consulted, recommend stop heparin, as troponin elevation likely do to demand ischemia in setting of hypoxia. Pulmonary consulted for acute on chronic respiratory failure with hypoxia in patient with post-COVID pulmonary fibrosis. Started dexamethasone. Patient requiring 5-8 L/min HFNC to maintain oxygen saturations.    Consultants:   Vidal Schwalbe, cardiology  Procedures: None  Antimicrobials:   Bactrim   Subjective: Patient reports feeling a little better.  Oxygenation decreased to 5 L.  Still appears mildly breathless while talking to me.  Denies any chest pain, fever, chills or any other symptoms.  Objective: Vitals:   01/30/19 0408 01/30/19 0731 01/30/19 0744 01/30/19 1315  BP: 138/90  121/77   Pulse: (!) 102  80 (!) 107  Resp: 20  19   Temp: 98.6 F  (37 C)  (!) 97.3 F (36.3 C)   TempSrc: Oral  Oral   SpO2: 92% 96% 98% (!) 83%  Weight:      Height:        Intake/Output Summary (Last 24 hours) at 01/30/2019 1651 Last data filed at 01/30/2019 1454 Gross per 24 hour  Intake -  Output 600 ml  Net -600 ml   Filed Weights   01/27/19 0529 01/28/19 0523 01/29/19 0456  Weight: 61 kg 60.9 kg 62.3 kg    Examination:  General exam: Appears calm and comfortable  Respiratory system: Clear to auscultation. Respiratory effort normal. Cardiovascular system: S1 & S2 heard, RRR. No JVD, murmurs, rubs, gallops or clicks. No pedal edema. Gastrointestinal system: Abdomen is nondistended, soft and nontender. No organomegaly or masses felt. Normal bowel sounds heard. Central nervous system: Alert and oriented. No focal neurological deficits. Extremities: Symmetric 5 x 5 power. Skin: No rashes, lesions or ulcers Psychiatry: Judgement and insight appear normal. Mood & affect appropriate.     Data Reviewed: I have personally reviewed following labs and imaging studies  CBC: Recent Labs  Lab 01/24/19 0720 01/25/19 0047 01/26/19 0458 01/27/19 0450 01/28/19 0518  WBC 7.7 5.1 3.1* 6.0 7.7  NEUTROABS 5.9  --  2.3 4.4  --   HGB 11.1* 10.5* 10.4* 10.0* 10.4*  HCT 34.5* 32.6* 31.6* 31.1* 31.5*  MCV 86.5 85.1 83.2 85.2 83.1  PLT 231 242 290 369 454*   Basic Metabolic Panel: Recent Labs  Lab 01/26/19 0458 01/27/19 0450 01/27/19 0823 01/28/19 0518 01/29/19 0431 01/30/19 0451  NA 134* 136  --  139 134* 137  K 4.7 5.4* 4.3 5.1 5.2* 4.4  CL 100 101  --  104 101  102  CO2 21* 23  --  25 25 23   GLUCOSE 366* 359*  --  193* 278* 224*  BUN 39* 53*  --  50* 49* 46*  CREATININE 1.64* 1.66*  --  1.54* 1.52* 1.32*  CALCIUM 7.8* 8.2*  --  8.6* 8.9 9.1  MG 2.0 2.2  --  2.3 2.0 2.0   GFR: Estimated Creatinine Clearance: 47.2 mL/min (A) (by C-G formula based on SCr of 1.32 mg/dL (H)). Liver Function Tests: Recent Labs  Lab 01/24/19 0816   AST 21  ALT 21  ALKPHOS 54  BILITOT 0.8  PROT 7.2  ALBUMIN 2.7*   No results for input(s): LIPASE, AMYLASE in the last 168 hours. No results for input(s): AMMONIA in the last 168 hours. Coagulation Profile: Recent Labs  Lab 01/24/19 0720  INR 1.0   Cardiac Enzymes: No results for input(s): CKTOTAL, CKMB, CKMBINDEX, TROPONINI in the last 168 hours. BNP (last 3 results) No results for input(s): PROBNP in the last 8760 hours. HbA1C: No results for input(s): HGBA1C in the last 72 hours. CBG: Recent Labs  Lab 01/29/19 1115 01/29/19 1628 01/29/19 2033 01/30/19 0740 01/30/19 1152  GLUCAP 213* 85 384* 175* 214*   Lipid Profile: No results for input(s): CHOL, HDL, LDLCALC, TRIG, CHOLHDL, LDLDIRECT in the last 72 hours. Thyroid Function Tests: No results for input(s): TSH, T4TOTAL, FREET4, T3FREE, THYROIDAB in the last 72 hours. Anemia Panel: No results for input(s): VITAMINB12, FOLATE, FERRITIN, TIBC, IRON, RETICCTPCT in the last 72 hours. Sepsis Labs: Recent Labs  Lab 01/24/19 0720 01/24/19 0816  PROCALCITON  --  0.22  LATICACIDVEN 1.8  --     Recent Results (from the past 240 hour(s))  Culture, blood (Routine x 2)     Status: None (Preliminary result)   Collection Time: 01/24/19  7:20 AM   Specimen: BLOOD  Result Value Ref Range Status   Specimen Description BLOOD BLOOD RIGHT HAND  Final   Special Requests   Final    BOTTLES DRAWN AEROBIC AND ANAEROBIC Blood Culture results may not be optimal due to an inadequate volume of blood received in culture bottles   Culture   Final    NO GROWTH 4 DAYS Performed at [facility], [address] [place], [place] [address]    Report Status PENDING  Incomplete  Culture, blood (Routine x 2)     Status: None (Preliminary result)   Collection Time: 01/24/19  7:21 AM   Specimen: BLOOD  Result Value Ref Range Status   Specimen Description BLOOD BLOOD LEFT HAND  Final   Special Requests   Final    BOTTLES DRAWN  AEROBIC AND ANAEROBIC Blood Culture results may not be optimal due to an inadequate volume of blood received in culture bottles   Culture   Final    NO GROWTH 4 DAYS Performed at [facility], [address] [place], [place] [address]    Report Status PENDING  Incomplete  SARS CORONAVIRUS 2 (TAT 6-24 HRS) Nasopharyngeal Nasopharyngeal Swab     Status: Abnormal   Collection Time: 01/24/19  1:17 PM   Specimen: Nasopharyngeal Swab  Result Value Ref Range Status   SARS Coronavirus 2 POSITIVE (A) NEGATIVE Final    Comment: RESULT CALLED TO, READ BACK BY AND VERIFIED WITH: C MURRAY,RN 2015 01/24/2019 D BRADLEY (NOTE) SARS-CoV-2 target nucleic acids are DETECTED. The SARS-CoV-2 RNA is generally detectable in upper and lower respiratory specimens during the acute phase of infection. Positive results are indicative  of active infection with SARS-CoV-2. Clinical  correlation with patient history and other diagnostic information is necessary to determine patient infection status. Positive results do  not rule out bacterial infection or co-infection with other viruses. The expected result is Negative. Fact Sheet for Patients: SugarRoll.be Fact Sheet for Healthcare Providers: https://www.woods-mathews.com/ This test is not yet approved or cleared by the [place] FDA and  has been authorized for detection and/or diagnosis of SARS-CoV-2 by FDA under an Emergency Use Authorization (EUA). This EUA will remain  in effect (meaning this test can be used) for  the duration of the COVID-19 declaration under Section 564(b)(1) of the Act, 21 U.S.C. section 360bbb-3(b)(1), unless the authorization is terminated or revoked sooner. Performed at [facility], [place] [address] [place], [place] [address]          Radiology Studies: No results found.      Scheduled Meds: . aspirin EC  81 mg Oral Daily  . atorvastatin  40 mg Oral Daily   . dexamethasone  6 mg Oral Daily  . enoxaparin (LOVENOX) injection  40 mg Subcutaneous Q24H  . famotidine  20 mg Oral Daily  . feeding supplement (NEPRO CARB STEADY)  237 mL Oral TID BM  . finasteride  5 mg Oral Daily  . gabapentin  600 mg Oral QHS  . insulin aspart  0-15 Units Subcutaneous TID WC  . insulin aspart  20 Units Subcutaneous TID WC  . insulin detemir  30 Units Subcutaneous Daily  . mouth rinse  15 mL Mouth Rinse BID  . midodrine  5 mg Oral TID WC  . multivitamin with minerals  1 tablet Oral Daily  . sodium chloride flush  10 mL Intravenous Q12H  . 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 -Pulmonary following -Dexamethasone 6 mg dailyx 10 days, no taper afterward -Bactrim DS twice daily for PCP prophylaxis while on steroids -Lasix 20 mg daily-continue to hold until creatinine improves -DuoNebs every 6 hours as needed -Albuterol every 4 hours as needed -Incentive spirometer -Supplemental oxygen to maintain O2 sats greater than 88% --May require SNF placement if unable to wean down O2 to level manageable at home --Pulmonary rehab after d/c   Recent COVID Pneumonia COVID-19 virus detected-  patient's initial positive COVID-19 test was on 12/11/2018 and again 11/19.  He subsequently had 2  admissions at [facility]. Since positive test >28 days ago and patient without new fevers, this is most likelyresidual positive from prior infection.  No need for airborne precautions.    Sinus tachycardia-resolved.Charted HR80's today, down from 120s to 140son admission. Most likely secondary to above.  -CTA chest on admission was negative for PE.  No signs of infection -monitor on telemetry -No indication for rate control medications in this setting -Management as above -Inform MD if HR sustains>120  Acute Kidney Injury- improving -Likelysecondary to diuresis -continue to hold lasix  for now - hold avoid nephrotoxic meds - renally dose meds as indicated   NSTEMI-ruled out Elevated troponin-due to demand ischemia in the setting of hypoxia, not acs -cards had seen pt.  -no further cardiac diagnostics necessary at this time. -recommended asa. 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 Worsening hyperglycemia secondary to steroids.  -Hold oral agents -IncreaseLevemirto 35units daily -NovoLog 20 units 3 times daily with meals -Continuesliding scale moderate  -Continue home gabapentin -Continue aspirin  Hypotension -Started on midodrine   Essential hypertension -was  borderline blood pressures, currently stable.  Does not appear to be on antihypertensives at home at this time.  Hyperlipidemia-continue home atorvastatin  GERD-continue Pepcid  BPH-continue finasteride    DVT prophylaxis: Lovenox Code Status:full Family Communication: no one at bedside Disposition Plan: 1-2 more days until clinically stable and cleared by pccm.d/c home with home health services       LOS: 6 days   Time spent: 45 minutes with more than 50% on COC    Lynn Ito, MD [facility] Pager [phone]  If 7PM-7AM, please contact night-coverage [email or web address] Password [facility], [facility], [facility] 01/30/2019, 4:51 PM

## 2019-01-30 NOTE — Progress Notes (Addendum)
Physical Therapy Treatment Patient Details Name: Bryan Oconnor MRN: [id number] DOB: [birth year] Today's Date: 01/30/2019    History of Present Illness Bryan Oconnor is a 73yoM who comes to [facility] after worsening SOB. Pt was hospitalized at [facility] twice for COVID-19 10/12-10/17, then 10/29-11/2. Pt has been at home with O2 since.    PT Comments    Pt in bed upon entry, down to 5L/min no via [place], but satting at 83% upon entry. Pt moved to 6L then 8L to find a sufficient resting level to establish baseline.  AMB 4x in session on varying flow rates to keep HR and SpO2 relatively within safe range. Until flow rate is adequate, pt moves exceedingly slow as to avoid higher metabolic levels of exercise, but at 14L he naturally reverts to a step-through pattern. Pt moving generally well, no significant weakness or gait instability, but at 14L only tolerates limited-household distances at this time.       Follow Up Recommendations  Home health PT     Equipment Recommendations  None recommended by PT    Recommendations for Other Services       Precautions / Restrictions Precautions Precautions: Fall Precaution Comments: Moderate fall; HFNC for activity Restrictions Weight Bearing Restrictions: No    Mobility  Bed Mobility Overal bed mobility: Independent                Transfers Overall transfer level: Independent                  Ambulation/Gait Ambulation/Gait assistance: Modified independent (Device/Increase time) Gait Distance (Feet): 80 Feet(40+40+40+80) Assistive device: None Gait Pattern/deviations: Step-through pattern;Step-to pattern Gait velocity: 0.40m/s (0.35m/s  monday) (moves faster after bumpted to 14L/min.   General Gait Details: Able to move a bit faster once at 14L c step throughout gait. No gait instability.   Stairs             Wheelchair Mobility    Modified Rankin (Stroke Patients Only)       Balance Overall balance  assessment: No apparent balance deficits (not formally assessed)                                          Cognition Arousal/Alertness: Awake/alert Behavior During Therapy: WFL for tasks assessed/performed Overall Cognitive Status: Within Functional Limits for tasks assessed                                        Exercises      General Comments        Pertinent Vitals/Pain Pain Assessment: No/denies pain Pain Intervention(s): Limited activity within patient's tolerance    Home Living                      Prior Function            PT Goals (current goals can now be found in the care plan section) Acute Rehab PT Goals Patient Stated Goal: have less DOE/SOB wth basic ADL PT Goal Formulation: With patient Time For Goal Achievement: 02/11/19 Potential to Achieve Goals: Fair Progress towards PT goals: Progressing toward goals    Frequency    Min 2X/week      PT Plan Current plan remains appropriate    Co-evaluation  AM-PAC PT "6 Clicks" Mobility   Outcome Measure  Help needed turning from your back to your side while in a flat bed without using bedrails?: None Help needed moving from lying on your back to sitting on the side of a flat bed without using bedrails?: None Help needed moving to and from a bed to a chair (including a wheelchair)?: None Help needed standing up from a chair using your arms (e.g., wheelchair or bedside chair)?: None Help needed to walk in hospital room?: A Little Help needed climbing 3-5 steps with a railing? : A Little 6 Click Score: 22    End of Session Equipment Utilized During Treatment: Oxygen Activity Tolerance: Patient tolerated treatment well;Patient limited by fatigue;Treatment limited secondary to medical complications (Comment) Patient left: in chair;with call bell/phone within reach Nurse Communication: Mobility status PT Visit Diagnosis: Unsteadiness on feet  (R26.81);Difficulty in walking, not elsewhere classified (R26.2)     Time: 9675-9163 PT Time Calculation (min) (ACUTE ONLY): 28 min  Charges:  $Therapeutic Exercise: 23-37 mins                     2:11 PM, 01/30/19 Rosamaria Lints, PT, DPT Physical Therapist - [facility]  [phone] (ASCOM)    , C 01/30/2019, 2:05 PM

## 2019-01-30 NOTE — Progress Notes (Signed)
Patient stating that when taking midodrine "I feel hot and fan myself. It only lasts a few minutes."

## 2019-01-30 NOTE — Progress Notes (Signed)
Occupational Therapy Treatment Patient Details Name: Bryan Oconnor MRN: [id number] DOB: 1950-06-20 Today's Date: 01/30/2019    History of present illness Bryan Oconnor is a 68yoM who comes to [facility] after worsening SOB. Pt was hospitalized at [facility] twice for COVID-19 10/12-10/17, then 10/29-11/2. Pt has been at home with O2 since.   OT comments  Bryan Oconnor was seen for OT treatment this date. Pt received seated upright in room recliner with HFNC in place and on 8L. Pt SpO2 remains WFL t/o ot tx session. Pt and provider reviewed energy conservation conservation strategies including pursed lip breathing, activity pacing, home/routines modifications, work simplification, AE/DME, prioritizing of meaningful occupations, and falls prevention. Pt verbalized understanding but would benefit from additional skilled OT services to maximize recall and carryover of learned techniques and facilitate implementation of learned techniques into daily routines. Pt continues to benefit from skilled OT services to maximize return to PLOF and minimize risk of future falls, injury, caregiver burden, and readmission. Will continue to follow POC. Discharge recommendation remains appropriate.      Follow Up Recommendations  Home health OT    Equipment Recommendations  3 in 1 bedside commode    Recommendations for Other Services      Precautions / Restrictions Precautions Precautions: Fall Precaution Comments: Moderate fall; HFNC for activity Restrictions Weight Bearing Restrictions: No       Mobility Bed Mobility Overal bed mobility: Independent                Transfers Overall transfer level: Independent                    Balance Overall balance assessment: No apparent balance deficits (not formally assessed)                                         ADL either performed or assessed with clinical judgement   ADL Overall ADL's : Needs assistance/impaired                                        General ADL Comments: Pt continues to be near baseline for ADL management, however experiences significant drop in SpO2 with functional mobility and exertional movements. Would benefit from continued education in energy conservation strategies and ways to implement strategies into functional activity.     Vision Baseline Vision/History: Wears glasses Wears Glasses: At all times Patient Visual Report: No change from baseline     Perception     Praxis      Cognition Arousal/Alertness: Awake/alert Behavior During Therapy: WFL for tasks assessed/performed Overall Cognitive Status: Within Functional Limits for tasks assessed                                          Exercises Other Exercises Other Exercises: OT provided reinforcement of prior education in energy conservation strategies. Pt and provider problem solved strategies for pt managing ADL tasks while at home. Pt return verbalizes understanding of education provided.   Shoulder Instructions       General Comments Pt SpO2 monitored t/o session and remained WFL (94-95%) with pt on 8L HFNC at rest. No exertional activity during OT session.    Pertinent Vitals/  Pain       Pain Assessment: No/denies pain Pain Intervention(s): Limited activity within patient's tolerance  Home Living                                          Prior Functioning/Environment              Frequency  Min 2X/week        Progress Toward Goals  OT Goals(current goals can now be found in the care plan section)  Progress towards OT goals: Progressing toward goals  Acute Rehab OT Goals Patient Stated Goal: have less DOE/SOB wth basic ADL OT Goal Formulation: With patient Time For Goal Achievement: 02/11/19 Potential to Achieve Goals: Good  Plan Discharge plan remains appropriate;Frequency remains appropriate    Co-evaluation                 AM-PAC OT  "6 Clicks" Daily Activity     Outcome Measure   Help from another person eating meals?: None Help from another person taking care of personal grooming?: None Help from another person toileting, which includes using toliet, bedpan, or urinal?: A Little Help from another person bathing (including washing, rinsing, drying)?: A Little Help from another person to put on and taking off regular upper body clothing?: None Help from another person to put on and taking off regular lower body clothing?: A Little 6 Click Score: 21    End of Session    OT Visit Diagnosis: Other abnormalities of gait and mobility (R26.89)   Activity Tolerance Patient tolerated treatment well   Patient Left in chair;with call bell/phone within reach;with chair alarm set;Other (comment)(With HFNC in place on 8L at start/end of session.)   Nurse Communication          Time: [phone] OT Time Calculation (min): 17 min  Charges: OT General Charges $OT Visit: 1 Visit OT Treatments $Self Care/Home Management : 8-22 mins  Shara Blazing, M.S., OTR/L Ascom: [phone] 01/30/19, 4:32 PM

## 2019-01-31 LAB — GLUCOSE, CAPILLARY
Glucose-Capillary: 161 mg/dL — ABNORMAL HIGH (ref 70–99)
Glucose-Capillary: 123 mg/dL — ABNORMAL HIGH (ref 70–99)
Glucose-Capillary: 352 mg/dL — ABNORMAL HIGH (ref 70–99)
Glucose-Capillary: 253 mg/dL — ABNORMAL HIGH (ref 70–99)

## 2019-01-31 NOTE — Progress Notes (Signed)
PROGRESS NOTE    Bryan ParadiseCharles E Oconnor  BMW:[id number] DOB: 04/26/1950 DOA: 01/24/2019 PCP: System, Pcp Not In    Brief Narrative:  68 y.o.malewith medical history significant foremphysema,hypertension, diabetes mellitus, CKD stage III, recent COVID-19 pneumonia with hospitalization through 10/12/2022to10/17/2020, hospitalized again from10/29/2020 to 01/07/2019,chronic hypoxemic respiratory failure on 3 L/min oxygen via nasal cannula at home.He presented to the hospital with worsening shortness of breathpast 2 days as he neared the end of steroid taper, but had been ongoing since he wasdischarged from the hospitalon 3L/min oxygen.Also reported a single episode of left-sided chest pain earlier that morning. Patient checked O2 sat at home, it was 68%, prompting him to come to ED. In the ED, patient requiring 6 L/min oxygen by high flow nasal cannula. CT of the chest was done but there was no evidence of acute pulmonary embolism or any other acute cardiothoracic abnormality. His troponin was elevated at 262so he was started on IV heparin infusion forpossibleNSTEMI.Admitted for further evaluation and management. Cardiology consulted, recommend stop heparin, as troponin elevation likely do to demand ischemia in setting of hypoxia. Pulmonary consulted for acute on chronic respiratory failure with hypoxia in patient with post-COVID pulmonary fibrosis. Started dexamethasone. Patient requiring 5-8 L/min HFNC to maintain oxygen saturations.    Consultants:   Vidal SchwalbeNeri, cardiology  Procedures: None  Antimicrobials:   Bactrim   Subjective: Feeling little better. But still requiring 02 5L -8L (at times). No new complaints Objective: Vitals:   01/30/19 1725 01/30/19 1928 01/30/19 2028 01/31/19 0404  BP: 110/82 114/78  (!) 125/93  Pulse: 89 (!) 101  (!) 101  Resp:  20  20  Temp: 97.9 F (36.6 C) 98 F (36.7 C)  (!) 97.5 F (36.4 C)  TempSrc: Oral Oral  Oral  SpO2: 97% 96% 95%  90%  Weight:    60.6 kg  Height:        Intake/Output Summary (Last 24 hours) at 01/31/2019 0728 Last data filed at 01/31/2019 0409 Gross per 24 hour  Intake -  Output 1750 ml  Net -1750 ml   Filed Weights   01/28/19 0523 01/29/19 0456 01/31/19 0404  Weight: 60.9 kg 62.3 kg 60.6 kg    Examination:  General exam: Appears calm and comfortable , mildly still breathless, nad Respiratory system: CTA, no w/r/r Cardiovascular system: S1 & S2 heard, RRR.no murmur Gastrointestinal system: Abdomen is nondistended, soft and nontender. Normal bowel sounds heard. Central nervous system: Alert and oriented. No focal neurological deficits. Extremities: No edema  Skin: warm, dry Psychiatry: Judgement and insight appear normal. Mood & affect appropriate.     Data Reviewed: I have personally reviewed following labs and imaging studies  CBC: Recent Labs  Lab 01/25/19 0047 01/26/19 0458 01/27/19 0450 01/28/19 0518  WBC 5.1 3.1* 6.0 7.7  NEUTROABS  --  2.3 4.4  --   HGB 10.5* 10.4* 10.0* 10.4*  HCT 32.6* 31.6* 31.1* 31.5*  MCV 85.1 83.2 85.2 83.1  PLT 242 290 369 454*   Basic Metabolic Panel: Recent Labs  Lab 01/26/19 0458 01/27/19 0450 01/27/19 0823 01/28/19 0518 01/29/19 0431 01/30/19 0451  NA 134* 136  --  139 134* 137  K 4.7 5.4* 4.3 5.1 5.2* 4.4  CL 100 101  --  104 101 102  CO2 21* 23  --  25 25 23   GLUCOSE 366* 359*  --  193* 278* 224*  BUN 39* 53*  --  50* 49* 46*  CREATININE 1.64* 1.66*  --  1.54* 1.52* 1.32*  CALCIUM 7.8* 8.2*  --  8.6* 8.9 9.1  MG 2.0 2.2  --  2.3 2.0 2.0   GFR: Estimated Creatinine Clearance: 45.9 mL/min (A) (by C-G formula based on SCr of 1.32 mg/dL (H)). Liver Function Tests: Recent Labs  Lab 01/24/19 0816  AST 21  ALT 21  ALKPHOS 54  BILITOT 0.8  PROT 7.2  ALBUMIN 2.7*   No results for input(s): LIPASE, AMYLASE in the last 168 hours. No results for input(s): AMMONIA in the last 168 hours. Coagulation Profile: No results for  input(s): INR, PROTIME in the last 168 hours. Cardiac Enzymes: No results for input(s): CKTOTAL, CKMB, CKMBINDEX, TROPONINI in the last 168 hours. BNP (last 3 results) No results for input(s): PROBNP in the last 8760 hours. HbA1C: No results for input(s): HGBA1C in the last 72 hours. CBG: Recent Labs  Lab 01/29/19 2033 01/30/19 0740 01/30/19 1152 01/30/19 1723 01/30/19 2050  GLUCAP 384* 175* 214* 225* 210*   Lipid Profile: No results for input(s): CHOL, HDL, LDLCALC, TRIG, CHOLHDL, LDLDIRECT in the last 72 hours. Thyroid Function Tests: No results for input(s): TSH, T4TOTAL, FREET4, T3FREE, THYROIDAB in the last 72 hours. Anemia Panel: No results for input(s): VITAMINB12, FOLATE, FERRITIN, TIBC, IRON, RETICCTPCT in the last 72 hours. Sepsis Labs: Recent Labs  Lab 01/24/19 0816  PROCALCITON 0.22    Recent Results (from the past 240 hour(s))  Culture, blood (Routine x 2)     Status: None (Preliminary result)   Collection Time: 01/24/19  7:20 AM   Specimen: BLOOD  Result Value Ref Range Status   Specimen Description BLOOD BLOOD RIGHT HAND  Final   Special Requests   Final    BOTTLES DRAWN AEROBIC AND ANAEROBIC Blood Culture results may not be optimal due to an inadequate volume of blood received in culture bottles   Culture   Final    NO GROWTH 4 DAYS Performed at [facility], [address] [place], [place] [address]    Report Status PENDING  Incomplete  Culture, blood (Routine x 2)     Status: None (Preliminary result)   Collection Time: 01/24/19  7:21 AM   Specimen: BLOOD  Result Value Ref Range Status   Specimen Description BLOOD BLOOD LEFT HAND  Final   Special Requests   Final    BOTTLES DRAWN AEROBIC AND ANAEROBIC Blood Culture results may not be optimal due to an inadequate volume of blood received in culture bottles   Culture   Final    NO GROWTH 4 DAYS Performed at [facility], [address] [place], [place] [address]    Report  Status PENDING  Incomplete  SARS CORONAVIRUS 2 (TAT 6-24 HRS) Nasopharyngeal Nasopharyngeal Swab     Status: Abnormal   Collection Time: 01/24/19  1:17 PM   Specimen: Nasopharyngeal Swab  Result Value Ref Range Status   SARS Coronavirus 2 POSITIVE (A) NEGATIVE Final    Comment: RESULT CALLED TO, READ BACK BY AND VERIFIED WITH: C MURRAY,RN 2015 01/24/2019 D BRADLEY (NOTE) SARS-CoV-2 target nucleic acids are DETECTED. The SARS-CoV-2 RNA is generally detectable in upper and lower respiratory specimens during the acute phase of infection. Positive results are indicative of active infection with SARS-CoV-2. Clinical  correlation with patient history and other diagnostic information is necessary to determine patient infection status. Positive results do  not rule out bacterial infection or co-infection with other viruses. The expected result is Negative. Fact Sheet for Patients: SugarRoll.be Fact Sheet for Healthcare Providers: https://www.woods-mathews.com/ This  test is not yet approved or cleared by the [place] and  has been authorized for detection and/or diagnosis of SARS-CoV-2 by FDA under an Emergency Use Authorization (EUA). This EUA will remain  in effect (meaning this test can be used) for  the duration of the COVID-19 declaration under Section 564(b)(1) of the Act, 21 U.S.C. section 360bbb-3(b)(1), unless the authorization is terminated or revoked sooner. Performed at [facility], [address], [place], [place] [address]          Radiology Studies: No results found.      Scheduled Meds: . aspirin EC  81 mg Oral Daily  . atorvastatin  40 mg Oral Daily  . dexamethasone  6 mg Oral Daily  . enoxaparin (LOVENOX) injection  40 mg Subcutaneous Q24H  . famotidine  20 mg Oral Daily  . feeding supplement (NEPRO CARB STEADY)  237 mL Oral TID BM  . finasteride  5 mg Oral Daily  . gabapentin  600 mg Oral QHS  .  insulin aspart  0-15 Units Subcutaneous TID WC  . insulin aspart  20 Units Subcutaneous TID WC  . insulin detemir  35 Units Subcutaneous Daily  . mouth rinse  15 mL Mouth Rinse BID  . midodrine  5 mg Oral TID WC  . multivitamin with minerals  1 tablet Oral Daily  . sodium chloride flush  10 mL Intravenous Q12H  . sulfamethoxazole-trimethoprim  1 tablet Oral Daily   Continuous Infusions:  Assessment & Plan:   Principal Problem:   Acute on chronic respiratory failure with hypoxia (HCC) Active Problems:   Essential hypertension   NSTEMI (non-ST elevated myocardial infarction) (HCC)   Elevated troponin   COVID-19 virus detected   Pulmonary fibrosis, postinflammatory (HCC)   Emphysema of lung (HCC)   Sinus tachycardia   AKI (acute kidney injury) (HCC)   Acute on chronic respiratory failure with hypoxia secondary to post-Covid pulmonary fibrosis with prior Covid pneumonia and ARDS COPD/emphysema-not acutely exacerbated - Secondary to recent Covid infection and ARDS, likely developing post Covid pulmonary fibrosis. -Still require high flow 02 5-8L, trying to wean down , but so far unsuccessful. -Pulmonary following -Dexamethasone 6 mg dailyx 10 days, no taper afterward -Bactrim DS twice daily for PCP prophylaxis while on steroids -Lasix 20 mg daily-held due aki., will start in am if renal function stable. -DuoNebs every 6 hours as needed -Albuterol every 4 hours as needed -Incentive spirometer -Supplemental oxygen to maintain O2 sats greater than 88% --May require SNF placement if unable to wean down O2 to level manageable at home --Pulmonary rehab after d/c    Recent COVID Pneumonia COVID-19 virus detected-  patient's initial positive COVID-19 test was on 12/11/2018 and again 11/19.  He subsequently had 2 admissions at [facility]. Since positive test >28 days ago and patient without new fevers, this is most likelyresidual positive from prior infection.  No need for  airborne precautions.    Sinus tachycardia-resolved. Charted HR80's today, down from 120s to 140son admission. Most likely secondary to above.  -CTA chest on admission was negative for PE.  No signs of infection -monitor on telemetry -No indication for rate control medications in this setting   Acute Kidney Injury- improving -Likelysecondary to diuresis -continue to hold lasix for now, will resume in am if renal function stable - hold avoid nephrotoxic meds - renally dose meds as indicated -ck am labs   NSTEMI-ruled out Elevated troponin-due to demand ischemia in the setting of hypoxia,  not acs -cards had seen pt.  -no further cardiac diagnostics necessary at this time. -recommended asa. 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 Worsening hyperglycemia secondary to steroids.  -Hold oral agents -Levemir was increasedto 35units daily, BG level improved -NovoLog 20 units 3 times daily with meals -Continuesliding scale moderate  -Continue home gabapentin -Continue aspirin  Hypotension -Started on midodrine   Essential hypertension -was  borderline blood pressures, currently stable.  Does not appear to be on antihypertensives at home at this time.  Hyperlipidemia-continue home atorvastatin  GERD-continue Pepcid  BPH-continue finasteride    DVT prophylaxis: Lovenox Code Status:full Family Communication: no one at bedside Disposition Plan: 1-2 more days until clinically stable and cleared by pccm.d/c home with home health services       LOS: 7 days   Time spent: 45 minutes with more than 50% on COC    Lynn Ito, MD [facility] Pager [phone]  If 7PM-7AM, please contact night-coverage [email or web address] Password [facility] 01/31/2019, 7:28  AM Patient ID: Bryan Oconnor, male   DOB: 09-Jan-1951, 68 y.o.   MRN: [id number]

## 2019-02-01 LAB — BRAIN NATRIURETIC PEPTIDE: B Natriuretic Peptide: 36 pg/mL (ref 0.0–100.0)

## 2019-02-01 LAB — BASIC METABOLIC PANEL
Anion gap: 11 (ref 5–15)
BUN: 47 mg/dL — ABNORMAL HIGH (ref 8–23)
CO2: 25 mmol/L (ref 22–32)
Calcium: 9.1 mg/dL (ref 8.9–10.3)
Chloride: 101 mmol/L (ref 98–111)
Creatinine, Ser: 1.24 mg/dL (ref 0.61–1.24)
GFR calc Af Amer: >60 mL/min (ref >60)
GFR calc non Af Amer: 59 mL/min — ABNORMAL LOW (ref >60)
Glucose, Bld: 164 mg/dL — ABNORMAL HIGH (ref 70–99)
Potassium: 4.4 mmol/L (ref 3.5–5.1)
Sodium: 137 mmol/L (ref 135–145)

## 2019-02-01 LAB — GLUCOSE, CAPILLARY
Glucose-Capillary: 158 mg/dL — ABNORMAL HIGH (ref 70–99)
Glucose-Capillary: 156 mg/dL — ABNORMAL HIGH (ref 70–99)
Glucose-Capillary: 319 mg/dL — ABNORMAL HIGH (ref 70–99)
Glucose-Capillary: 390 mg/dL — ABNORMAL HIGH (ref 70–99)

## 2019-02-01 MED ORDER — INSULIN ASPART 100 UNIT/ML ~~LOC~~ SOLN
0.0000 [IU] | SUBCUTANEOUS | Status: DC
  Administered 2019-02-02: 15 [IU] via SUBCUTANEOUS
  Administered 2019-02-02: 4 [IU] via SUBCUTANEOUS
  Administered 2019-02-02: 15 [IU] via SUBCUTANEOUS
  Administered 2019-02-02 (×2): 11 [IU] via SUBCUTANEOUS
  Administered 2019-02-03: 20 [IU] via SUBCUTANEOUS
  Administered 2019-02-03: 4 [IU] via SUBCUTANEOUS
  Administered 2019-02-04: 3 [IU] via SUBCUTANEOUS
  Administered 2019-02-04: 4 [IU] via SUBCUTANEOUS
  Administered 2019-02-04: 15 [IU] via SUBCUTANEOUS
  Administered 2019-02-04 – 2019-02-05 (×2): 3 [IU] via SUBCUTANEOUS
  Filled 2019-02-01 (×12): qty 1

## 2019-02-01 MED ORDER — FUROSEMIDE 10 MG/ML IJ SOLN
20.0000 mg | Freq: Every day | INTRAMUSCULAR | Status: DC
  Administered 2019-02-01 – 2019-02-02 (×2): 20 mg via INTRAVENOUS
  Filled 2019-02-01 (×2): qty 2

## 2019-02-01 MED ORDER — INSULIN ASPART 100 UNIT/ML ~~LOC~~ SOLN
15.0000 [IU] | Freq: Three times a day (TID) | SUBCUTANEOUS | Status: DC
  Administered 2019-02-02 – 2019-02-07 (×16): 15 [IU] via SUBCUTANEOUS
  Filled 2019-02-01 (×16): qty 1

## 2019-02-01 NOTE — Progress Notes (Signed)
Patient bedtime CBG 253. NP Ouma notified. Awaiting response.   Update: NP Ouma renotified about patients CBG of 253. No new orders. Will continue to monitor.   Bryan Oconnor M

## 2019-02-01 NOTE — Care Management Important Message (Signed)
Important Message  Patient Details  Name: Bryan Oconnor MRN: [id number] Date of Birth: 22-Jan-1951   Medicare Important Message Given:  Yes     Dannette Barbara 02/01/2019, 12:24 PM

## 2019-02-01 NOTE — Progress Notes (Signed)
Physical Therapy Treatment Patient Details Name: Bryan Oconnor MRN: [id number] DOB: [birth year] Today's Date: 02/01/2019    History of Present Illness Bryan Oconnor is a 68yoM who comes to [facility] after worsening SOB. Pt was hospitalized at [facility] twice for COVID-19 10/12-10/17, then 10/29-11/2. Pt has been at home with O2 since.    PT Comments    Pt received in bed on 5L/min [place] satting better (92%) than when found similarly 2 days prior (83%). Pt agreeable to participate. Pt able to advance AMB distance this date to 123ft x2, seated rest between efforts. Pt continues to AMB with slightly improved gait spee. Pt still requires 10L/min for AMB c drop to 83% SpO2. HR high today on average and higher peak with AMB, now to 139BPM while AMB household distances. Pt able to manage his [place], HFNC, foley bag, and O2 tank this date.    Activity/Position Flowrate SpO2 HR Notes:   Resting in bed  5L/min [place] 92% 107 bpm   s/p supine to EOB  10L/min HFNC 93% 124 bpm   s/p AMB 141ft 10L/min HFNC 83% 137 bpm Pt able to manage O2 tank  60sec recovery  10L/min HFNC 89% 123 bpm   s/p AMB 145ft 10L/min HFNC 83% 139 bpm Pt able to manage O2 tank  60sec recovery  15L/min HFNC 94% 107bpm tank goes driectly from 10L to 15L          Follow Up Recommendations  Home health PT     Equipment Recommendations  None recommended by PT    Recommendations for Other Services       Precautions / Restrictions Precautions Precautions: Fall Precaution Comments: Moderate fall; HFNC for activity Restrictions Weight Bearing Restrictions: No    Mobility  Bed Mobility Overal bed mobility: Modified Independent                Transfers Overall transfer level: Modified independent Equipment used: None                Ambulation/Gait Ambulation/Gait assistance: Modified independent (Device/Increase time) Gait Distance (Feet): 132 Feet(post AMB 129ft 10L/min HFNC 83% 137bpm) Assistive device: None(O2 tank  c adjacent foley bag) Gait Pattern/deviations: Step-through pattern;Step-to pattern Gait velocity: 0.47m/s (0.75m/s on 11/25; 0.78m/s on 11/23) Moves with less restriction when properly oxygenated. Gait velocity interpretation: <1.8 ft/sec, indicate of risk for recurrent falls     Stairs             Wheelchair Mobility    Modified Rankin (Stroke Patients Only)       Balance Overall balance assessment: Mild deficits observed, not formally tested;Independent                                          Cognition Arousal/Alertness: Awake/alert Behavior During Therapy: WFL for tasks assessed/performed Overall Cognitive Status: Within Functional Limits for tasks assessed                                        Exercises      General Comments        Pertinent Vitals/Pain Pain Assessment: No/denies pain    Home Living                      Prior Function  PT Goals (current goals can now be found in the care plan section) Acute Rehab PT Goals Patient Stated Goal: have less DOE/SOB wth basic ADL PT Goal Formulation: With patient Time For Goal Achievement: 02/11/19 Potential to Achieve Goals: Fair Progress towards PT goals: Progressing toward goals    Frequency    Min 2X/week      PT Plan Current plan remains appropriate    Co-evaluation              AM-PAC PT "6 Clicks" Mobility   Outcome Measure  Help needed turning from your back to your side while in a flat bed without using bedrails?: None Help needed moving from lying on your back to sitting on the side of a flat bed without using bedrails?: None Help needed moving to and from a bed to a chair (including a wheelchair)?: None Help needed standing up from a chair using your arms (e.g., wheelchair or bedside chair)?: None Help needed to walk in hospital room?: A Little Help needed climbing 3-5 steps with a railing? : A Little 6 Click Score:  22    End of Session Equipment Utilized During Treatment: Oxygen Activity Tolerance: Patient tolerated treatment well;Patient limited by fatigue;Treatment limited secondary to medical complications (Comment) Patient left: in chair;with call bell/phone within reach;with nursing/sitter in room Nurse Communication: Mobility status PT Visit Diagnosis: Unsteadiness on feet (R26.81);Difficulty in walking, not elsewhere classified (R26.2)     Time: 0454-0981 PT Time Calculation (min) (ACUTE ONLY): 23 min  Charges:  $Therapeutic Exercise: 23-37 mins                     1:41 PM, 02/01/19 Etta Grandchild, PT, DPT Physical Therapist - [facility]  [phone] ([place])    Buccola,Allan C 02/01/2019, 1:38 PM

## 2019-02-01 NOTE — Plan of Care (Signed)
  Problem: Clinical Measurements: Goal: Respiratory complications will improve Outcome: Progressing Goal: Cardiovascular complication will be avoided Outcome: Progressing   Problem: Activity: Goal: Risk for activity intolerance will decrease Outcome: Progressing   Problem: Pain Managment: Goal: General experience of comfort will improve Outcome: Progressing   Problem: Safety: Goal: Ability to remain free from injury will improve Outcome: Progressing   Problem: Activity: Goal: Ability to tolerate increased activity will improve Outcome: Progressing   Problem: Cardiac: Goal: Ability to achieve and maintain adequate cardiovascular perfusion will improve Outcome: Progressing

## 2019-02-01 NOTE — Progress Notes (Signed)
PROGRESS NOTE    Bryan Oconnor  ZOX:[id number] DOB: 11/02/1950 DOA: 01/24/2019 PCP: System, Pcp Not In    Brief Narrative:  68 y.o.malewith medical history significant foremphysema,hypertension, diabetes mellitus, CKD stage III, recent COVID-19 pneumonia with hospitalization through 10/12/2022to10/17/2020, hospitalized again from10/29/2020 to 01/07/2019,chronic hypoxemic respiratory failure on 3 L/min oxygen via nasal cannula at home.He presented to the hospital with worsening shortness of breathpast 2 days as he neared the end of steroid taper, but had been ongoing since he wasdischarged from the hospitalon 3L/min oxygen.Also reported a single episode of left-sided chest pain earlier that morning. Patient checked O2 sat at home, it was 68%, prompting him to come to ED. In the ED, patient requiring 6 L/min oxygen by high flow nasal cannula. CT of the chest was done but there was no evidence of acute pulmonary embolism or any other acute cardiothoracic abnormality. His troponin was elevated at 262so he was started on IV heparin infusion forpossibleNSTEMI.Admitted for further evaluation and management. Cardiology consulted, recommend stop heparin, as troponin elevation likely do to demand ischemia in setting of hypoxia. Pulmonary consulted for acute on chronic respiratory failure with hypoxia in patient with post-COVID pulmonary fibrosis. Started dexamethasone. Patient requiring 5-8 L/min HFNC to maintain oxygen saturations.    Consultants:   Vidal SchwalbeNeri, cardiology  Procedures: None  Antimicrobials:   Bactrim   Subjective: Reports feeling a little better.  Still requiring 5 to 6 L of oxygen.  No new complaints.   Objective: Vitals:   01/31/19 1930 02/01/19 0353 02/01/19 0729 02/01/19 0800  BP: 133/76 127/89 133/76   Pulse: 99 74 75   Resp: 19 20 19    Temp: 97.7 F (36.5 C) 97.8 F (36.6 C) 98.1 F (36.7 C)   TempSrc: Oral Oral    SpO2: 95% 100% 99% 95%   Weight:  61.2 kg    Height:        Intake/Output Summary (Last 24 hours) at 02/01/2019 1230 Last data filed at 02/01/2019 0900 Gross per 24 hour  Intake 1200 ml  Output 2000 ml  Net -800 ml   Filed Weights   01/29/19 0456 01/31/19 0404 02/01/19 0353  Weight: 62.3 kg 60.6 kg 61.2 kg    Examination:  General exam: Appears calm and comfortable ,still mildly breathless, but looks better, nad Respiratory system: CTA b/l , no w/r/r Cardiovascular system: S1 & S2 heard, RRR.no murmur or gallops Gastrointestinal system: Abdomen is nondistended, soft and nontender. +bs Central nervous system: Alert and oriented. No focal neurological deficits. Extremities: No edema or cyanosis Skin: warm, dry Psychiatry: Judgement and insight appear normal. Mood & affect appropriate.     Data Reviewed: I have personally reviewed following labs and imaging studies  CBC: Recent Labs  Lab 01/26/19 0458 01/27/19 0450 01/28/19 0518  WBC 3.1* 6.0 7.7  NEUTROABS 2.3 4.4  --   HGB 10.4* 10.0* 10.4*  HCT 31.6* 31.1* 31.5*  MCV 83.2 85.2 83.1  PLT 290 369 454*   Basic Metabolic Panel: Recent Labs  Lab 01/26/19 0458 01/27/19 0450 01/27/19 0823 01/28/19 0518 01/29/19 0431 01/30/19 0451 02/01/19 0450  NA 134* 136  --  139 134* 137 137  K 4.7 5.4* 4.3 5.1 5.2* 4.4 4.4  CL 100 101  --  104 101 102 101  CO2 21* 23  --  25 25 23 25   GLUCOSE 366* 359*  --  193* 278* 224* 164*  BUN 39* 53*  --  50* 49* 46* 47*  CREATININE 1.64* 1.66*  --  1.54* 1.52* 1.32* 1.24  CALCIUM 7.8* 8.2*  --  8.6* 8.9 9.1 9.1  MG 2.0 2.2  --  2.3 2.0 2.0  --    GFR: Estimated Creatinine Clearance: 49.4 mL/min (by C-G formula based on SCr of 1.24 mg/dL). Liver Function Tests: No results for input(s): AST, ALT, ALKPHOS, BILITOT, PROT, ALBUMIN in the last 168 hours. No results for input(s): LIPASE, AMYLASE in the last 168 hours. No results for input(s): AMMONIA in the last 168 hours. Coagulation Profile: No results  for input(s): INR, PROTIME in the last 168 hours. Cardiac Enzymes: No results for input(s): CKTOTAL, CKMB, CKMBINDEX, TROPONINI in the last 168 hours. BNP (last 3 results) No results for input(s): PROBNP in the last 8760 hours. HbA1C: No results for input(s): HGBA1C in the last 72 hours. CBG: Recent Labs  Lab 01/31/19 1142 01/31/19 1652 01/31/19 2102 02/01/19 0729 02/01/19 1153  GLUCAP 123* 352* 253* 158* 156*   Lipid Profile: No results for input(s): CHOL, HDL, LDLCALC, TRIG, CHOLHDL, LDLDIRECT in the last 72 hours. Thyroid Function Tests: No results for input(s): TSH, T4TOTAL, FREET4, T3FREE, THYROIDAB in the last 72 hours. Anemia Panel: No results for input(s): VITAMINB12, FOLATE, FERRITIN, TIBC, IRON, RETICCTPCT in the last 72 hours. Sepsis Labs: No results for input(s): PROCALCITON, LATICACIDVEN in the last 168 hours.  Recent Results (from the past 240 hour(s))  Culture, blood (Routine x 2)     Status: None (Preliminary result)   Collection Time: 01/24/19  7:20 AM   Specimen: BLOOD  Result Value Ref Range Status   Specimen Description BLOOD BLOOD RIGHT HAND  Final   Special Requests   Final    BOTTLES DRAWN AEROBIC AND ANAEROBIC Blood Culture results may not be optimal due to an inadequate volume of blood received in culture bottles   Culture   Final    NO GROWTH 4 DAYS Performed at [facility], [address], [place], [place] [address]    Report Status PENDING  Incomplete  Culture, blood (Routine x 2)     Status: None (Preliminary result)   Collection Time: 01/24/19  7:21 AM   Specimen: BLOOD  Result Value Ref Range Status   Specimen Description BLOOD BLOOD LEFT HAND  Final   Special Requests   Final    BOTTLES DRAWN AEROBIC AND ANAEROBIC Blood Culture results may not be optimal due to an inadequate volume of blood received in culture bottles   Culture   Final    NO GROWTH 4 DAYS Performed at [facility], [address] [place],  [place] [address]    Report Status PENDING  Incomplete  SARS CORONAVIRUS 2 (TAT 6-24 HRS) Nasopharyngeal Nasopharyngeal Swab     Status: Abnormal   Collection Time: 01/24/19  1:17 PM   Specimen: Nasopharyngeal Swab  Result Value Ref Range Status   SARS Coronavirus 2 POSITIVE (A) NEGATIVE Final    Comment: RESULT CALLED TO, READ BACK BY AND VERIFIED WITH: C MURRAY,RN 2015 01/24/2019 D BRADLEY (NOTE) SARS-CoV-2 target nucleic acids are DETECTED. The SARS-CoV-2 RNA is generally detectable in upper and lower respiratory specimens during the acute phase of infection. Positive results are indicative of active infection with SARS-CoV-2. Clinical  correlation with patient history and other diagnostic information is necessary to determine patient infection status. Positive results do  not rule out bacterial infection or co-infection with other viruses. The expected result is Negative. Fact Sheet for Patients: HairSlick.no Fact Sheet for Healthcare Providers: quierodirigir.com This test is not yet  approved or cleared by the [place] and  has been authorized for detection and/or diagnosis of SARS-CoV-2 by FDA under an Emergency Use Authorization (EUA). This EUA will remain  in effect (meaning this test can be used) for  the duration of the COVID-19 declaration under Section 564(b)(1) of the Act, 21 U.S.C. section 360bbb-3(b)(1), unless the authorization is terminated or revoked sooner. Performed at [facility], [address] [place], [place] [address]          Radiology Studies: No results found.      Scheduled Meds: . aspirin EC  81 mg Oral Daily  . atorvastatin  40 mg Oral Daily  . dexamethasone  6 mg Oral Daily  . enoxaparin (LOVENOX) injection  40 mg Subcutaneous Q24H  . famotidine  20 mg Oral Daily  . feeding supplement (NEPRO CARB STEADY)  237 mL Oral TID BM  . finasteride  5 mg Oral Daily  . furosemide   20 mg Intravenous Daily  . gabapentin  600 mg Oral QHS  . insulin aspart  0-15 Units Subcutaneous TID WC  . insulin aspart  20 Units Subcutaneous TID WC  . insulin detemir  35 Units Subcutaneous Daily  . mouth rinse  15 mL Mouth Rinse BID  . midodrine  5 mg Oral TID WC  . multivitamin with minerals  1 tablet Oral Daily  . sodium chloride flush  10 mL Intravenous Q12H  . sulfamethoxazole-trimethoprim  1 tablet Oral Daily   Continuous Infusions:  Assessment & Plan:   Principal Problem:   Acute on chronic respiratory failure with hypoxia (HCC) Active Problems:   Essential hypertension   NSTEMI (non-ST elevated myocardial infarction) (HCC)   Elevated troponin   COVID-19 virus detected   Pulmonary fibrosis, postinflammatory (HCC)   Emphysema of lung (HCC)   Sinus tachycardia   AKI (acute kidney injury) (HCC)   Acute on chronic respiratory failure with hypoxia secondary to post-Covid pulmonary fibrosis with prior Covid pneumonia and ARDS COPD/emphysema-not acutely exacerbated - Secondary to recent Covid infection and ARDS, likely developing post Covid pulmonary fibrosis. -Still require high flow 02 5-8L, trying to wean down , but so far unsuccessful. -Pulmonary following -Dexamethasone 6 mg dailyx 10 days, no taper afterward -Bactrim DS twice daily for PCP prophylaxis while on steroids -We will restart his Lasix 20 mg daily and monitor renal function. -DuoNebs every 6 hours as needed -Albuterol every 4 hours as needed -Incentive spirometer -Supplemental oxygen to maintain O2 sats greater than 88% --May require SNF placement if unable to wean down O2 to level manageable at home --Pulmonary rehab after d/c Have asked pulmonology to see pt as we are not able to wean him down 02.    Recent COVID Pneumonia COVID-19 virus detected-  patient's initial positive COVID-19 test was on 12/11/2018 and again 11/19.  He subsequently had 2 admissions at [facility]. Since positive test  >28 days ago and patient without new fevers, this is most likelyresidual positive from prior infection.   No need for airborne precautions.    Sinus tachycardia-resolved. Charted HR80's today, down from 120s to 140son admission. Most likely secondary to above.  -CTA chest on admission was negative for PE.  No signs of infection -monitor on telemetry -No indication for rate control medications in this setting   Acute Kidney Injury- Resolved -Likelysecondary to diuresis -Resuming Lasix 20 mg daily as stated above monitor renal function closely - hold avoid nephrotoxic meds - renally dose meds as indicated  NSTEMI-ruled out Elevated troponin-due to demand ischemia in the setting of hypoxia, not acs -cards had seen pt.  -no further cardiac diagnostics necessary at this time. -recommended asa. 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 Worsening hyperglycemia secondary to steroids.  -Hold oral agents -Levemir was increasedto 35units daily, BG level improved -NovoLog 20 units 3 times daily with meals -Continuesliding scale moderate  -Continue home gabapentin -Continue aspirin  Hypotension -Started on midodrine   Essential hypertension -was  borderline blood pressures, currently stable.  Does not appear to be on antihypertensives at home at this time.  Hyperlipidemia-continue home atorvastatin  GERD-continue Pepcid  BPH-continue finasteride    DVT prophylaxis: Lovenox Code Status:full Family Communication: no one at bedside Disposition Plan: 1-2 more days until clinically stable and cleared by pccm.d/c home with home health services       LOS: 8 days   Time spent: 45 minutes with more than 50% on [place], MD [facility]  Pager [phone]  If 7PM-7AM, please contact night-coverage [email or web address] Password [facility] 02/01/2019, 12:30 PM Patient ID: Bryan Oconnor, male   DOB: [birth year], 68 y.o.   MRN: [id number] Patient ID: Bryan Oconnor, male   DOB: 1950-04-11, 68 y.o.   MRN: [id number]

## 2019-02-02 LAB — GLUCOSE, CAPILLARY
Glucose-Capillary: 159 mg/dL — ABNORMAL HIGH (ref 70–99)
Glucose-Capillary: 251 mg/dL — ABNORMAL HIGH (ref 70–99)
Glucose-Capillary: 259 mg/dL — ABNORMAL HIGH (ref 70–99)
Glucose-Capillary: 311 mg/dL — ABNORMAL HIGH (ref 70–99)
Glucose-Capillary: 323 mg/dL — ABNORMAL HIGH (ref 70–99)
Glucose-Capillary: 96 mg/dL (ref 70–99)

## 2019-02-02 MED ORDER — PANTOPRAZOLE SODIUM 20 MG PO TBEC
20.0000 mg | DELAYED_RELEASE_TABLET | Freq: Every day | ORAL | Status: DC
  Administered 2019-02-02 – 2019-02-07 (×6): 20 mg via ORAL
  Filled 2019-02-02 (×6): qty 1

## 2019-02-02 MED ORDER — INSULIN DETEMIR 100 UNIT/ML ~~LOC~~ SOLN
37.0000 [IU] | Freq: Every day | SUBCUTANEOUS | Status: DC
  Administered 2019-02-03 – 2019-02-07 (×5): 37 [IU] via SUBCUTANEOUS
  Filled 2019-02-02 (×6): qty 0.37

## 2019-02-02 NOTE — Progress Notes (Addendum)
Patient sitting up on side of bed oxygen via nasal cannula increased from 4 to 6l to get O2 sat of 91%. Patient ambulated short distance in the hall on 6L O2 dropped down to 88%. Patient is currently back on 6l at 92% resting in chair. Call bell in reach. Patient states no discomfort at this time. Dr. Kurtis Bushman made aware.

## 2019-02-02 NOTE — Plan of Care (Signed)
  Problem: Clinical Measurements: Goal: Ability to maintain clinical measurements within normal limits will improve Outcome: Progressing Goal: Diagnostic test results will improve Outcome: Progressing Goal: Respiratory complications will improve Outcome: Progressing   Problem: Activity: Goal: Risk for activity intolerance will decrease Outcome: Progressing   

## 2019-02-02 NOTE — Progress Notes (Addendum)
PROGRESS NOTE    Bryan Oconnor  ZOX:[id number] DOB: January 12, 1951 DOA: 01/24/2019 PCP: System, Pcp Not In    Brief Narrative:  68 y.o.malewith medical history significant foremphysema,hypertension, diabetes mellitus, CKD stage III, recent COVID-19 pneumonia with hospitalization through 10/12/2022to10/17/2020, hospitalized again from10/29/2020 to 01/07/2019,chronic hypoxemic respiratory failure on 3 L/min oxygen via nasal cannula at home.He presented to the hospital with worsening shortness of breathpast 2 days as he neared the end of steroid taper, but had been ongoing since he wasdischarged from the hospitalon 3L/min oxygen.Also reported a single episode of left-sided chest pain earlier that morning. Patient checked O2 sat at home, it was 68%, prompting him to come to ED. In the ED, patient requiring 6 L/min oxygen by high flow nasal cannula. CT of the chest was done but there was no evidence of acute pulmonary embolism or any other acute cardiothoracic abnormality. His troponin was elevated at 262so he was started on IV heparin infusion forpossibleNSTEMI.Admitted for further evaluation and management. Cardiology consulted, recommend stop heparin, as troponin elevation likely do to demand ischemia in setting of hypoxia. Pulmonary consulted for acute on chronic respiratory failure with hypoxia in patient with post-COVID pulmonary fibrosis. Started dexamethasone. Patient requiring 5-8 L/min HFNC to maintain oxygen saturations.    Consultants:   Vidal Schwalbe, cardiology  Procedures: None  Antimicrobials:   Bactrim   Subjective: Patient states his breathing is better he is weaned down to 4 L.  His report is quite dyspneic when he walks.  At some point he was placed on 10 L yesterday but currently on 4 L.  Objective: Vitals:   02/01/19 1549 02/01/19 1919 02/02/19 0358 02/02/19 0712  BP: 106/67 133/70 127/82 125/83  Pulse: 96 (!) 107 84 91  Resp: 19   19  Temp: 98.1 F  (36.7 C) 97.6 F (36.4 C) 98 F (36.7 C) 97.7 F (36.5 C)  TempSrc:  Oral Oral Oral  SpO2: 94% 93% 98% 96%  Weight:   62 kg   Height:        Intake/Output Summary (Last 24 hours) at 02/02/2019 1003 Last data filed at 02/02/2019 0400 Gross per 24 hour  Intake -  Output 2350 ml  Net -2350 ml   Filed Weights   01/31/19 0404 02/01/19 0353 02/02/19 0358  Weight: 60.6 kg 61.2 kg 62 kg    Examination:  General exam: Appears calm and comfortable ,becomes little breathless with movement Respiratory system: CTA b/l , no w/r/r Cardiovascular system: S1 & S2 heard, RRR.no murmur or gallops Gastrointestinal system:soft, nt/nd +bs Central nervous system: Alert and oriented x3. No focal neurological deficits. Extremities: No edema or cyanosis Skin: warm, dry Psychiatry: Judgement and insight appear normal. Mood & affect appropriate.     Data Reviewed: I have personally reviewed following labs and imaging studies  CBC: Recent Labs  Lab 01/27/19 0450 01/28/19 0518  WBC 6.0 7.7  NEUTROABS 4.4  --   HGB 10.0* 10.4*  HCT 31.1* 31.5*  MCV 85.2 83.1  PLT 369 454*   Basic Metabolic Panel: Recent Labs  Lab 01/27/19 0450 01/27/19 0823 01/28/19 0518 01/29/19 0431 01/30/19 0451 02/01/19 0450  NA 136  --  139 134* 137 137  K 5.4* 4.3 5.1 5.2* 4.4 4.4  CL 101  --  104 101 102 101  CO2 23  --  GLUCOSE 359*  --  193* 278* 224* 164*  BUN 53*  --  50* 49* 46* 47*  CREATININE 1.66*  --  1.54* 1.52* 1.32* 1.24  CALCIUM 8.2*  --  8.6* 8.9 9.1 9.1  MG 2.2  --  2.3 2.0 2.0  --    GFR: Estimated Creatinine Clearance: 50 mL/min (by C-G formula based on SCr of 1.24 mg/dL). Liver Function Tests: No results for input(s): AST, ALT, ALKPHOS, BILITOT, PROT, ALBUMIN in the last 168 hours. No results for input(s): LIPASE, AMYLASE in the last 168 hours. No results for input(s): AMMONIA in the last 168 hours. Coagulation Profile: No results for input(s): INR, PROTIME in the  last 168 hours. Cardiac Enzymes: No results for input(s): CKTOTAL, CKMB, CKMBINDEX, TROPONINI in the last 168 hours. BNP (last 3 results) No results for input(s): PROBNP in the last 8760 hours. HbA1C: No results for input(s): HGBA1C in the last 72 hours. CBG: Recent Labs  Lab 02/01/19 1652 02/01/19 2059 02/02/19 0002 02/02/19 0421 02/02/19 0714  GLUCAP 319* 390* 311* 96 259*   Lipid Profile: No results for input(s): CHOL, HDL, LDLCALC, TRIG, CHOLHDL, LDLDIRECT in the last 72 hours. Thyroid Function Tests: No results for input(s): TSH, T4TOTAL, FREET4, T3FREE, THYROIDAB in the last 72 hours. Anemia Panel: No results for input(s): VITAMINB12, FOLATE, FERRITIN, TIBC, IRON, RETICCTPCT in the last 72 hours. Sepsis Labs: No results for input(s): PROCALCITON, LATICACIDVEN in the last 168 hours.  Recent Results (from the past 240 hour(s))  Culture, blood (Routine x 2)     Status: None (Preliminary result)   Collection Time: 01/24/19  7:20 AM   Specimen: BLOOD  Result Value Ref Range Status   Specimen Description BLOOD BLOOD RIGHT HAND  Final   Special Requests   Final    BOTTLES DRAWN AEROBIC AND ANAEROBIC Blood Culture results may not be optimal due to an inadequate volume of blood received in culture bottles   Culture   Final    NO GROWTH 4 DAYS Performed at [facility], [address] [place], [place] [address]    Report Status PENDING  Incomplete  Culture, blood (Routine x 2)     Status: None (Preliminary result)   Collection Time: 01/24/19  7:21 AM   Specimen: BLOOD  Result Value Ref Range Status   Specimen Description BLOOD BLOOD LEFT HAND  Final   Special Requests   Final    BOTTLES DRAWN AEROBIC AND ANAEROBIC Blood Culture results may not be optimal due to an inadequate volume of blood received in culture bottles   Culture   Final    NO GROWTH 4 DAYS Performed at [facility], [address] [place], [place] [address]    Report Status PENDING   Incomplete  SARS CORONAVIRUS 2 (TAT 6-24 HRS) Nasopharyngeal Nasopharyngeal Swab     Status: Abnormal   Collection Time: 01/24/19  1:17 PM   Specimen: Nasopharyngeal Swab  Result Value Ref Range Status   SARS Coronavirus 2 POSITIVE (A) NEGATIVE Final    Comment: RESULT CALLED TO, READ BACK BY AND VERIFIED WITH: C MURRAY,RN 2015 01/24/2019 D BRADLEY (NOTE) SARS-CoV-2 target nucleic acids are DETECTED. The SARS-CoV-2 RNA is generally detectable in upper and lower respiratory specimens during the acute phase of infection. Positive results are indicative of active infection with SARS-CoV-2. Clinical  correlation with patient history and other diagnostic information is necessary to determine patient infection status. Positive results do  not rule out bacterial infection or co-infection with other viruses. The expected result is Negative. Fact Sheet for Patients: HairSlick.no Fact Sheet for Healthcare Providers: quierodirigir.com This test is not yet approved or  cleared by the [place] and  has been authorized for detection and/or diagnosis of SARS-CoV-2 by FDA under an Emergency Use Authorization (EUA). This EUA will remain  in effect (meaning this test can be used) for  the duration of the COVID-19 declaration under Section 564(b)(1) of the Act, 21 U.S.C. section 360bbb-3(b)(1), unless the authorization is terminated or revoked sooner. Performed at [facility], [place] [address], [place],  [address]          Radiology Studies: No results found.      Scheduled Meds: . aspirin EC  81 mg Oral Daily  . atorvastatin  40 mg Oral Daily  . dexamethasone  6 mg Oral Daily  . enoxaparin (LOVENOX) injection  40 mg Subcutaneous Q24H  . famotidine  20 mg Oral Daily  . feeding supplement (NEPRO CARB STEADY)  237 mL Oral TID BM  . finasteride  5 mg Oral Daily  . furosemide  20 mg Intravenous Daily  . gabapentin   600 mg Oral QHS  . insulin aspart  0-20 Units Subcutaneous Q4H  . insulin aspart  15 Units Subcutaneous TID WC  . insulin detemir  35 Units Subcutaneous Daily  . mouth rinse  15 mL Mouth Rinse BID  . midodrine  5 mg Oral TID WC  . multivitamin with minerals  1 tablet Oral Daily  . sodium chloride flush  10 mL Intravenous Q12H  . 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 -weaned down to 4L,on rest.  -Pulmonary following -Dexamethasone 6 mg dailyx 8/10 days, no taper afterward -Bactrim DS twice daily for PCP prophylaxis while on steroids -Restarted on Lasix 20 mg and monitor kidney function -DuoNebs every 6 hours as needed -Albuterol every 4 hours as needed -Incentive spirometer -Supplemental oxygen to maintain O2 sats greater than 88% --May require SNF placement if unable to wean down O2 to level manageable at home --Pulmonary rehab after d/c Awaiting pccm input.    Recent COVID Pneumonia COVID-19 virus detected-  patient's initial positive COVID-19 test was on 12/11/2018 and again 11/19.  He subsequently had 2 admissions at [facility]. Since positive test >28 days ago and patient without new fevers, this is most likelyresidual positive from prior infection.   No need for airborne precautions.    Sinus  tachycardia-resolved. Most likely secondary to #1  -CTA chest on admission was negative for PE.  No signs of infection -monitor on telemetry -No indication for rate control medications in this setting   Acute Kidney Injury- Resolved -Likelysecondary to diuresis -Resuming Lasix 20 mg daily as stated above monitor renal function closely - hold avoid nephrotoxic meds - renally dose meds as indicated    NSTEMI-ruled out Elevated troponin-due to demand ischemia in the setting of hypoxia, not acs -cards had seen pt.  -no further cardiac diagnostics necessary at this time. -recommended asa. 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 Worsening hyperglycemia secondary to steroids.  -Hold oral agents -Levemir was increasedto 35units daily, BG level improved -NovoLog 20 units 3 times daily with meals -Continuesliding scale moderate  -Continue home gabapentin -Continue aspirin  Hypotension -Started on midodrine   Essential hypertension -was  borderline blood pressures, currently stable.  Does not appear to be on antihypertensives at home at this time.  Hyperlipidemia-continue home atorvastatin  GERD-continue Pepcid  BPH-continue finasteride    DVT prophylaxis: Lovenox Code Status:full Family Communication: no one at bedside Disposition Plan: 1-2 more days until clinically stable and cleared by pccm.d/c home with home health services       LOS: 9 days   Time spent: 45 minutes with more than 50% on COC  Addendum -spoke to pulmonology Dr. Meredeth IdeFleming he stated to keep patient for a few more days, continue his prednisone p.o. as is, and discontinue Lasix.  We will also increase his Lantus to 37 units for now.  Lynn ItoSahar Vashaun Osmon, MD [facility] Pager [phone]   If 7PM-7AM, please contact night-coverage [email or web address] Password [email or web address] 02/02/2019, 10:03 AM Patient ID: Patrice ParadiseCharles E Wayne, male   DOB: 10/04/1950, 68 y.o.   MRN: [id number] Patient ID: Patrice ParadiseCharles E Rome, male   DOB: 09/01/1950, 68 y.o.   MRN: [id number] Patient ID: Patrice ParadiseCharles E Yousuf, male   DOB: 04/25/1950, 68 y.o.   MRN: [id number]

## 2019-02-02 NOTE — Consult Note (Signed)
CHIEF COMPLAINT:   Post [phone] pneumonitis/hypoxia   HISTORY OF PRESENT ILLNESS   This is a pleasant 68 year old African [place] male with a history of diabetes, essential hypertension, CKD 3, copd who recently was  Hospitalized  at [facility]  from the 12th to the 17th for acute COVID-19 infection status post acute hypoxemic respiratory failure due to COVID-19.  He  discharged returned to the ED at [facility] with hyperglycemia subsequently was admitted xr with hypoxia, dyspnea and vague lest sided chest pain and mild troponin elevation (262). Cardiology has seen him. Not cadriac significance noted. Heparin drip stopped. He is on dvt prophylaxis.  Presently he is in bed side chair, on 6 liters o2 via nasal canula. Ambulated today and sats drop to 88 % on 6 liters. He is sating at 92 % now. He denies wheezing, + occasional cough, mentation is  well, no headaches, muscle weakness and appetite is great. There is no fever or pleurisy, hemoptysis, calf pain or swelling. He is back on dexametasone, chest ct did not show pneumonia or pulmonary embolism.   Pulmonary consultation was placed for persistent  hypoxemic respiratory failure .   PAST MEDICAL HISTORY       Past Medical History:  Diagnosis Date  . Diabetes mellitus without complication ([place])   . Hypertension, copd, CKD 3      SURGICAL HISTORY   History reviewed. No pertinent surgical history.   FAMILY HISTORY   History reviewed. No pertinent family history.   SOCIAL HISTORY   Social History        Tobacco Use  . Smoking status: Never Smoker  . Smokeless tobacco: Never Used  Substance Use Topics  . Alcohol use: Never    Frequency: Never  . Drug use: Not on file     MEDICATIONS    Home Medication:    Current Medication:  Current Facility-Administered Medications:  .  acetaminophen (TYLENOL) tablet 650 mg, 650 mg, Oral, Q6H PRN **OR** acetaminophen (TYLENOL) suppository 650 mg, 650  mg, Rectal, Q6H PRN, Jennye Boroughs, MD .  albuterol (VENTOLIN HFA) 108 (90 Base) MCG/ACT inhaler 2 puff, 2 puff, Inhalation, Q4H PRN, Jennye Boroughs, MD, 2 puff at 01/25/19 0905 .  aspirin EC tablet 81 mg, 81 mg, Oral, Daily, Jennye Boroughs, MD, 81 mg at 01/25/19 0903 .  atorvastatin (LIPITOR) tablet 40 mg, 40 mg, Oral, Daily, Jennye Boroughs, MD, 40 mg at 01/25/19 0903 .  enoxaparin (LOVENOX) injection 40 mg, 40 mg, Subcutaneous, Q24H, Nicole Kindred A, DO, 40 mg at 01/25/19 1504 .  famotidine (PEPCID) tablet 20 mg, 20 mg, Oral, Daily, Jennye Boroughs, MD, 20 mg at 01/25/19 0903 .  feeding supplement (NEPRO CARB STEADY) liquid 237 mL, 237 mL, Oral, TID BM, Griffith, Kelly A, DO, 237 mL at 01/25/19 1415 .  finasteride (PROSCAR) tablet 5 mg, 5 mg, Oral, Daily, Jennye Boroughs, MD, 5 mg at 01/25/19 0903 .  gabapentin (NEURONTIN) capsule 600 mg, 600 mg, Oral, QHS, Jennye Boroughs, MD .  insulin aspart (novoLOG) injection 0-5 Units, 0-5 Units, Subcutaneous, QHS, Jennye Boroughs, MD, 2 Units at 01/24/19 2130 .  insulin aspart (novoLOG) injection 0-9 Units, 0-9 Units, Subcutaneous, TID WC, Jennye Boroughs, MD, 2 Units at 01/25/19 1213 .  insulin detemir (LEVEMIR) injection 12 Units, 12 Units, Subcutaneous, Daily, Jennye Boroughs, MD, 12 Units at 01/25/19 0904 .  MEDLINE mouth rinse, 15 mL, Mouth Rinse, BID, Jennye Boroughs, MD, 15 mL at 01/24/19 2135 .  [START ON 01/26/2019] multivitamin with minerals tablet  1 tablet, 1 tablet, Oral, Daily, Denton Lank, Kelly A, DO    ALLERGIES   Pollen extract     REVIEW OF SYSTEMS    Review of Systems:  Gen:  Denies  fever, sweats, chills weigh loss  HEENT: Denies blurred vision, double vision, ear pain, eye pain, hearing loss, nose bleeds, sore throat Cardiac:  No dizziness, chest pain or heaviness, chest tightness,edema Resp:   Denies cough or sputum porduction,wheezing, hemoptysis, ++ dyspnea more so with activity Gi: Denies swallowing difficulty,  stomach pain, nausea or vomiting, diarrhea, constipation, bowel incontinence Gu:  Denies bladder incontinence, burning urine Ext:   Denies Joint pain, stiffness or swelling Skin: Denies  skin rash, easy bruising or bleeding or hives Endoc:  Denies polyuria, polydipsia , polyphagia or weight change Psych:   Denies depression, insomnia or hallucinations   Other:  All other systems negative   VS: BP 105/70 (BP Location: Right Arm)   Pulse (!) 121   Temp 98 F (36.7 C)   Resp 18   Ht  (1.676 m)   Wt 60.3 kg   SpO2 94%   BMI 21.46 kg/m      PHYSICAL EXAM    GENERAL:NAD, no fevers, chills, no weakness no fatigue, sitting in chair, small frame, good historian HEAD: Normocephalic, atraumatic, glasses on.  EYES: Pupils equal, round, reactive to light. Extraocular muscles intact. No scleral icterus.  MOUTH: Moist mucosal membrane. Dentition intact. No abscess noted.  EAR, NOSE, THROAT: Clear without exudates. No external lesions.  NECK: Supple. No thyromegaly. No nodules. No JVD.  PULMONARY: Diffuse mild coarse rhonchi right sided and basal crackles, no obvious wheezing CARDIOVASCULAR: S1 and S2. Regular rate and rhythm. No murmurs, rubs, or gallops. No edema. Pedal pulses 2+ bilaterally.  GASTROINTESTINAL: Soft, nontender, nondistended. No masses. Positive bowel sounds. No hepatosplenomegaly.  MUSCULOSKELETAL: No swelling, clubbing, or edema. Range of motion full in all extremities.  NEUROLOGIC: Cranial nerves II through XII are intact. No gross focal neurological deficits. Sensation intact. Reflexes intact.  SKIN: No ulceration, lesions, rashes, or cyanosis. Skin warm and dry. Turgor intact.  PSYCHIATRIC: Mood, affect within normal limits. The patient is awake, alert and oriented x 3. Insight, judgment intact.  LYMPH: No nodes  Labs: Results for CORDARRELL, SANE (MRN [id number]) as of 02/02/2019 12:02  Ref. Range 01/27/2019 08:23 01/28/2019 05:18 01/29/2019 04:31  01/30/2019 04:51 02/01/2019 04:50  BASIC METABOLIC PANEL Unknown  Rpt (A) Rpt (A) Rpt (A) Rpt (A)  Sodium Latest Ref Range: 135 - 145 mmol/L  139 134 (L) 137 137  Potassium Latest Ref Range: 3.5 - 5.1 mmol/L 4.3 5.1 5.2 (H) 4.4 4.4  Chloride Latest Ref Range: 98 - 111 mmol/L  104 101 102 101  CO2 Latest Ref Range: 22 - 32 mmol/L  Glucose Latest Ref Range: 70 - 99 mg/dL  119 (H) 147 (H) 829 (H) 164 (H)  BUN Latest Ref Range: 8 - 23 mg/dL  50 (H) 49 (H) 46 (H) 47 (H)  Creatinine Latest Ref Range: 0.61 - 1.24 mg/dL  5.62 (H) 1.30 (H) 8.65 (H) 1.24  Calcium Latest Ref Range: 8.9 - 10.3 mg/dL  8.6 (L) 8.9 9.1 9.1  Anion gap Latest Ref Range: 5 - Magnesium Latest Ref Range: 1.7 - 2.4 mg/dL  2.3 2.0 2.0   GFR, Est Non African American Latest Ref Range: >60 mL/min  46 (L) 46 (L) 55 (L) 59 (L)  GFR, Est African American Latest Ref Range: >60 mL/min  53 (L) 54 (L) >60 >60  B Natriuretic Peptide Latest Ref Range: 0.0 - 100.0 pg/mL     36.0  WBC Latest Ref Range: 4.0 - 10.5 K/uL  7.7     RBC Latest Ref Range: 4.22 - 5.81 MIL/uL  3.79 (L)     Hemoglobin Latest Ref Range: 13.0 - 17.0 g/dL  04.510.4 (L)     HCT Latest Ref Range: 39.0 - 52.0 %  31.5 (L)     MCV Latest Ref Range: 80.0 - 100.0 fL  83.1     MCH Latest Ref Range: 26.0 - 34.0 pg  27.4     MCHC Latest Ref Range: 30.0 - 36.0 g/dL  40.933.0     RDW Latest Ref Range: 11.5 - 15.5 %  14.3     Platelets Latest Ref Range: 150 - 400 K/uL  454 (H)     nRBC Latest Ref Range: 0.0 - 0.2 %  0.0        CLINICAL DATA:  Shortness of breath.  Recent COVID-19 pneumonia  EXAM: CT ANGIOGRAPHY CHEST WITH CONTRAST  TECHNIQUE: Multidetector CT imaging of the chest was performed using the standard protocol during bolus administration of intravenous contrast. Multiplanar CT image reconstructions and MIPs were obtained to evaluate the vascular anatomy.  CONTRAST:  75mL OMNIPAQUE IOHEXOL 350 MG/ML SOLN  COMPARISON:  Chest  radiograph January 24, 2019 and chest CT January 03, 2019  FINDINGS: Cardiovascular: There is no demonstrable pulmonary embolus. There is no thoracic aortic aneurysm or dissection. Visualized great vessels appear unremarkable. Note that the right innominate and left common carotid arteries arise as a common trunk, an anatomic variant. No evident pericardial effusion or pericardial thickening. Main pulmonary artery measures 3.1 cm, borderline for pulmonary arterial hypertension.  Mediastinum/Nodes: Thyroid appears unremarkable. There is a subcarinal lymph node measuring 1.7 x 1.3 cm, slightly increased in size compared to the previous study. There are scattered subcentimeter mediastinal lymph nodes elsewhere. There is a small hiatal hernia.  Lungs/Pleura: There is underlying emphysematous change. There is extensive fibrosis throughout the lungs bilaterally. There are areas of associated atelectatic change. There is no frank airspace consolidation evident currently. There are no pleural effusions. There is a degree of lower lobe traction bronchiectasis.  Upper Abdomen: There is mild reflux of contrast into the inferior vena cava and hepatic veins. Visualized upper abdominal structures otherwise appear unremarkable.  Musculoskeletal: No blastic or lytic bone lesions. No chest wall lesions are evident.  Review of the MIP images confirms the above findings.  IMPRESSION: 1. No demonstrable pulmonary embolus. No thoracic aortic aneurysm or dissection.  2. Underlying emphysematous change with extensive fibrosis throughout the lungs bilaterally. Areas of atelectatic change noted. No well-defined airspace consolidation. A degree of residual viral pneumonitis is possible intermingled with this extensive fibrosis. There is also lower lobe bronchiectatic change bilaterally.  3. Prominent subcarinal lymph node which potentially may be due to the parenchymal lung changes. No  other frank adenopathy.  4. Borderline prominent main pulmonary outflow tract, a finding that may be indicative of a degree of underlying pulmonary arterial hypertension.  5. Mild reflux of contrast into the inferior vena cava and hepatic veins, a finding that may be indicative of a degree of increased right heart pressure.  6.  Small hiatal hernia.  Emphysema (ICD10-J43.9).   Electronically Signed   By: Bretta BangWilliam  Woodruff III M.D.   On: 01/24/2019 10:15   Impression This is  a pleasant 68 yo male, s/p Covid 35 with impending respiratory failure, in the setting of copd. He is having post covid persistent dyspnea and hypoxia.  He seem to think his dyspnea worsen when his prednisone was at the end of the taper. 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 He does appears to have a cardiac cause nor pulmonary embolism. Presently no signs of muscle or diaphragm weakness. (myasthenia gravis and GB has been reported with Covid infection). Hence I agree with the course of care being rendered. If no new findings he can go home when we get the fio2 down below 4 liters with out patient follow up in pulmonary clinic  -incentive spirometry -dvt prophylaxis -copd regimen as is -bactrim prophylaxis will on the steroids -on d/c will extend prdnisone x 3 weeks or so -wean or as tolerated, keep sats > 90 % -following with you Thanks for including [place] in his care

## 2019-02-03 ENCOUNTER — Encounter: Payer: Self-pay | Admitting: Specialist

## 2019-02-03 LAB — BASIC METABOLIC PANEL
Anion gap: 11 (ref 5–15)
BUN: 47 mg/dL — ABNORMAL HIGH (ref 8–23)
CO2: 25 mmol/L (ref 22–32)
Calcium: 9.2 mg/dL (ref 8.9–10.3)
Chloride: 102 mmol/L (ref 98–111)
Creatinine, Ser: 1.22 mg/dL (ref 0.61–1.24)
GFR calc Af Amer: >60 mL/min (ref >60)
GFR calc non Af Amer: >60 mL/min (ref >60)
Glucose, Bld: 102 mg/dL — ABNORMAL HIGH (ref 70–99)
Potassium: 4.3 mmol/L (ref 3.5–5.1)
Sodium: 138 mmol/L (ref 135–145)

## 2019-02-03 LAB — GLUCOSE, CAPILLARY
Glucose-Capillary: 100 mg/dL — ABNORMAL HIGH (ref 70–99)
Glucose-Capillary: 109 mg/dL — ABNORMAL HIGH (ref 70–99)
Glucose-Capillary: 110 mg/dL — ABNORMAL HIGH (ref 70–99)
Glucose-Capillary: 117 mg/dL — ABNORMAL HIGH (ref 70–99)
Glucose-Capillary: 128 mg/dL — ABNORMAL HIGH (ref 70–99)
Glucose-Capillary: 189 mg/dL — ABNORMAL HIGH (ref 70–99)
Glucose-Capillary: 391 mg/dL — ABNORMAL HIGH (ref 70–99)

## 2019-02-03 NOTE — Progress Notes (Signed)
PROGRESS NOTE    Bryan Oconnor  WVP:[id number] DOB: 1950/11/24 DOA: 01/24/2019 PCP: System, Pcp Not In    Brief Narrative:  68 y.o.malewith medical history significant foremphysema,hypertension, diabetes mellitus, CKD stage III, recent COVID-19 pneumonia with hospitalization through 10/12/2022to10/17/2020, hospitalized again from10/29/2020 to 01/07/2019,chronic hypoxemic respiratory failure on 3 L/min oxygen via nasal cannula at home.He presented to the hospital with worsening shortness of breathpast 2 days as he neared the end of steroid taper, but had been ongoing since he wasdischarged from the hospitalon 3L/min oxygen.Also reported a single episode of left-sided chest pain earlier that morning. Patient checked O2 sat at home, it was 68%, prompting him to come to ED. In the ED, patient requiring 6 L/min oxygen by high flow nasal cannula. CT of the chest was done but there was no evidence of acute pulmonary embolism or any other acute cardiothoracic abnormality. His troponin was elevated at 262so he was started on IV heparin infusion forpossibleNSTEMI.Admitted for further evaluation and management. Cardiology consulted, recommend stop heparin, as troponin elevation likely do to demand ischemia in setting of hypoxia. Pulmonary consulted for acute on chronic respiratory failure with hypoxia in patient with post-COVID pulmonary fibrosis. Started dexamethasone. Patient requiring 5-8 L/min HFNC to maintain oxygen saturations.    Consultants:   Vidal Schwalbe, cardiology  Procedures: None  Antimicrobials:   Bactrim   Subjective: Patient feels better but not at baseline.  Upon ambulation only a short distance he requires 6 L and he still dropped to 88%, with laying in bed now he is requiring 6 L at 92%. No new complaints.    Objective: Vitals:   02/02/19 1945 02/03/19 0428 02/03/19 0437 02/03/19 0728  BP:  122/84 108/72 120/71  Pulse:  85 78 81  Resp:  20 20 18   Temp:   97.6 F (36.4 C) 98.1 F (36.7 C) 98.1 F (36.7 C)  TempSrc:  Oral Oral Oral  SpO2: 96% 99% 93% 92%  Weight:  61.5 kg    Height:        Intake/Output Summary (Last 24 hours) at 02/03/2019 1208 Last data filed at 02/03/2019 1008 Gross per 24 hour  Intake 240 ml  Output 1200 ml  Net -960 ml   Filed Weights   02/01/19 0353 02/02/19 0358 02/03/19 0428  Weight: 61.2 kg 62 kg 61.5 kg    Examination:  General exam: Appears calm and comfortable , nad, less breathless unless moving Respiratory system: dry crackles b/l bases, no wheezing Cardiovascular system: S1 & S2 heard, RRR.no murmur or gallops Gastrointestinal system:soft, nt/nd +bs Central nervous system: Alert and oriented x3. No focal neurological deficits. Extremities: No edema or cyanosis Skin: warm, dry Psychiatry: Judgement and insight appear normal. Mood & affect appropriate in current setting.     Data Reviewed: I have personally reviewed following labs and imaging studies  CBC: Recent Labs  Lab 01/28/19 0518  WBC 7.7  HGB 10.4*  HCT 31.5*  MCV 83.1  PLT 454*   Basic Metabolic Panel: Recent Labs  Lab 01/28/19 0518 01/29/19 0431 01/30/19 0451 02/01/19 0450 02/03/19 0446  NA 139 134* 137 137 138  K 5.1 5.2* 4.4 4.4 4.3  CL 104 101 102 101 102  CO2 25 25 23 25 25   GLUCOSE 193* 278* 224* 164* 102*  BUN 50* 49* 46* 47* 47*  CREATININE 1.54* 1.52* 1.32* 1.24 1.22  CALCIUM 8.6* 8.9 9.1 9.1 9.2  MG 2.3 2.0 2.0  --   --    GFR: Estimated Creatinine Clearance:  50.4 mL/min (by C-G formula based on SCr of 1.22 mg/dL). Liver Function Tests: No results for input(s): AST, ALT, ALKPHOS, BILITOT, PROT, ALBUMIN in the last 168 hours. No results for input(s): LIPASE, AMYLASE in the last 168 hours. No results for input(s): AMMONIA in the last 168 hours. Coagulation Profile: No results for input(s): INR, PROTIME in the last 168 hours. Cardiac Enzymes: No results for input(s): CKTOTAL, CKMB, CKMBINDEX,  TROPONINI in the last 168 hours. BNP (last 3 results) No results for input(s): PROBNP in the last 8760 hours. HbA1C: No results for input(s): HGBA1C in the last 72 hours. CBG: Recent Labs  Lab 02/02/19 1955 02/03/19 0009 02/03/19 0427 02/03/19 0729 02/03/19 1114  GLUCAP 323* 100* 109* 110* 128*   Lipid Profile: No results for input(s): CHOL, HDL, LDLCALC, TRIG, CHOLHDL, LDLDIRECT in the last 72 hours. Thyroid Function Tests: No results for input(s): TSH, T4TOTAL, FREET4, T3FREE, THYROIDAB in the last 72 hours. Anemia Panel: No results for input(s): VITAMINB12, FOLATE, FERRITIN, TIBC, IRON, RETICCTPCT in the last 72 hours. Sepsis Labs: No results for input(s): PROCALCITON, LATICACIDVEN in the last 168 hours.  Recent Results (from the past 240 hour(s))  SARS CORONAVIRUS 2 (TAT 6-24 HRS) Nasopharyngeal Nasopharyngeal Swab     Status: Abnormal   Collection Time: 01/24/19  1:17 PM   Specimen: Nasopharyngeal Swab  Result Value Ref Range Status   SARS Coronavirus 2 POSITIVE (A) NEGATIVE Final    Comment: RESULT CALLED TO, READ BACK BY AND VERIFIED WITH: C MURRAY,RN 2015 01/24/2019 D BRADLEY (NOTE) SARS-CoV-2 target nucleic acids are DETECTED. The SARS-CoV-2 RNA is generally detectable in upper and lower respiratory specimens during the acute phase of infection. Positive results are indicative of active infection with SARS-CoV-2. Clinical  correlation with patient history and other diagnostic information is necessary to determine patient infection status. Positive results do  not rule out bacterial infection or co-infection with other viruses. The expected result is Negative. Fact Sheet for Patients: HairSlick.nohttps://www.fda.gov/media/138098/download Fact Sheet for Healthcare Providers: quierodirigir.comhttps://www.fda.gov/media/138095/download This test is not yet approved or cleared by the [place] FDA and  has been authorized for detection and/or diagnosis of SARS-CoV-2 by FDA under an Emergency  Use Authorization (EUA). This EUA will remain  in effect (meaning this test can be used) for  the duration of the COVID-19 declaration under Section 564(b)(1) of the Act, 21 U.S.C. section 360bbb-3(b)(1), unless the authorization is terminated or revoked sooner. Performed at [facility], [address] [place], [place] [address]          Radiology Studies: No results found.      Scheduled Meds: . aspirin EC  81 mg Oral Daily  . atorvastatin  40 mg Oral Daily  . dexamethasone  6 mg Oral Daily  . enoxaparin (LOVENOX) injection  40 mg Subcutaneous Q24H  . feeding supplement (NEPRO CARB STEADY)  237 mL Oral TID BM  . finasteride  5 mg Oral Daily  . gabapentin  600 mg Oral QHS  . insulin aspart  0-20 Units Subcutaneous Q4H  . insulin aspart  15 Units Subcutaneous TID WC  . insulin detemir  37 Units Subcutaneous Daily  . mouth rinse  15 mL Mouth Rinse BID  . midodrine  5 mg Oral TID WC  . multivitamin with minerals  1 tablet Oral Daily  . pantoprazole  20 mg Oral Daily  . sodium chloride flush  10 mL Intravenous Q12H  . sulfamethoxazole-trimethoprim  1 tablet Oral Daily   Continuous Infusions:  Assessment & Plan:   Principal Problem:   Acute on chronic respiratory failure with hypoxia (HCC) Active Problems:   Essential hypertension   NSTEMI (non-ST elevated myocardial infarction) (HCC)   Elevated troponin   COVID-19 virus detected   Pulmonary fibrosis, postinflammatory (HCC)   Emphysema of lung (HCC)   Sinus tachycardia   AKI (acute kidney injury) ([place])   Acute on chronic respiratory failure with hypoxia secondary to post-Covid pulmonary fibrosis with prior Covid pneumonia and ARDS COPD/emphysema-not acutely exacerbated - Secondary to recent Covid infection and ARDS, likely developing post Covid pulmonary fibrosis. -weaned down to 4L,on rest.  -Pulmonary following -Dexamethasone 6 mg daily-Per pulmonary extend prednisone x3 weeks -Bactrim DS twice  daily for PCP prophylaxis while on steroids -DC Lasix as pulmonary discussion with me likely does not need Lasix at this time -DuoNebs every 6 hours as needed -Albuterol every 4 hours as needed -Incentive spirometer -Supplemental oxygen to maintain O2 sats >90% --Pulmonary rehab after d/c -Pulmonary input was appreciated recommend deseeding home when his FiO2 is down below 4 L with outpatient follow-up with pulmonary clinic with Dr. Raul Del    Recent COVID Pneumonia COVID-19 virus detected-  patient's initial positive COVID-19 test was on 12/11/2018 and again 11/19.  He subsequently had 2 admissions at [facility]. Since positive test >28 days ago and patient without new fevers, this is most likelyresidual positive from prior infection.   No need for airborne precautions.    Sinus tachycardia-resolved. Most likely secondary to #1  -CTA chest on admission was negative for PE.  No signs of infection -monitor on telemetry -No indication for rate control medications in this setting   Acute Kidney Injury- Resolved -Likelysecondary to diuresis -d/c'd lasix now as he is euvolemic. - hold avoid nephrotoxic meds - renally dose meds as indicated    NSTEMI-ruled out Elevated troponin-due to demand ischemia in the setting of hypoxia, not acs -cards had seen pt.  -no further cardiac diagnostics necessary at this time. -recommended asa. 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 Worsening hyperglycemia secondary to steroids -Hold oral agents -Levemir was increasedfrom 35units to 37 U daily, now BG level improved -NovoLog 20 units 3 times daily with meals -Continuesliding scale moderate  -Continue home gabapentin -Continue aspirin  Hypotension -Started on midodrine   Essential  hypertension -was  borderline blood pressures, currently stable.  Does not appear to be on antihypertensives at home at this time.  Hyperlipidemia-continue home atorvastatin  GERD-continue Pepcid  BPH-continue finasteride    DVT prophylaxis: Lovenox Code Status:full Family Communication: no one at bedside Disposition Plan: Likely will be here 2 midnight days as she is still requiring high flow oxygen, will need to keep him until his FiO2 is below 4 L .pulmonary following         LOS: 10 days   Time spent: 45 minutes with more than 50% on [place], MD [facility] Pager [phone]  If 7PM-7AM, please contact night-coverage [email or web address] Password [facility] 02/03/2019, 12:08 PM Patient ID: Bryan Oconnor, male   DOB: June 12, 1950, 68 y.o.   MRN: [id number]

## 2019-02-03 NOTE — Progress Notes (Signed)
Pulmonary Medicine          Date: 02/03/2019,   MRN# [id number] Bryan Oconnor [birth year]     AdmissionWeight: 56.7 kg                 CurrentWeight: 61.5 kg   CHIEF COMPLAINT:   Persistent hypoxia   HISTORY OF PRESENT ILLNESS  This is a pleasant 68 year old [place] male with a history of diabetes, essential hypertension, CKD 3, copd who recently was  Hospitalized  at [facility]  from the 12th to the 17th for acute COVID-19 infection status post acute hypoxemic respiratory failure due to COVID-19. He  discharged returned to the ED at [facility] with hyperglycemia subsequently was admitted xr with hypoxia, dyspnea and vague lest sided chest pain and mild troponin elevation (262). Cardiology has seen him. Not cadriac significance noted. Heparin drip stopped. He is on dvt prophylaxis.   He denies wheezing, + occasional cough, mentation is  well, no headaches, muscle weakness and appetite is great. There is no fever or pleurisy, hemoptysis, calf pain or swelling. He is back on dexametasone, chest ct did not show pneumonia or pulmonary embolism.  Pulmonary consultation was placed for persistent  hypoxemic respiratory failure . Since yesterday, fio2 is down to 5 liters, o2 sats 92. Dyspnea is no worse.   PAST MEDICAL HISTORY   Past Medical History:  Diagnosis Date   Diabetes mellitus without complication (HCC)    Hypertension      SURGICAL HISTORY   History reviewed. No pertinent surgical history.   FAMILY HISTORY   History reviewed. No pertinent family history.   SOCIAL HISTORY   Social History   Tobacco Use   Smoking status: Never Smoker   Smokeless tobacco: Never Used  Substance Use Topics   Alcohol use: Never    Frequency: Never   Drug use: Not on file     MEDICATIONS    Home Medication:    Current Medication:  Current Facility-Administered Medications:    acetaminophen (TYLENOL) tablet 650 mg, 650 mg, Oral, Q6H PRN  **OR** acetaminophen (TYLENOL) suppository 650 mg, 650 mg, Rectal, Q6H PRN, Bryan Shadow, MD   albuterol (VENTOLIN HFA) 108 (90 Base) MCG/ACT inhaler 2 puff, 2 puff, Inhalation, Q4H PRN, Bryan Shadow, MD, 2 puff at 01/25/19 0905   aspirin EC tablet 81 mg, 81 mg, Oral, Daily, Bryan Shadow, MD, 81 mg at 02/03/19 0855   atorvastatin (LIPITOR) tablet 40 mg, 40 mg, Oral, Daily, Bryan Shadow, MD, 40 mg at 02/03/19 0855   dexamethasone (DECADRON) tablet 6 mg, 6 mg, Oral, Daily, Karna Christmas, Fuad, MD, 6 mg at 02/03/19 0855   enoxaparin (LOVENOX) injection 40 mg, 40 mg, Subcutaneous, Q24H, Esaw Grandchild A, DO, 40 mg at 02/02/19 1701   feeding supplement (NEPRO CARB STEADY) liquid 237 mL, 237 mL, Oral, TID BM, Griffith, Kelly A, DO, 237 mL at 02/02/19 1106   finasteride (PROSCAR) tablet 5 mg, 5 mg, Oral, Daily, Bryan Shadow, MD, 5 mg at 02/03/19 0854   gabapentin (NEURONTIN) capsule 600 mg, 600 mg, Oral, QHS, Bryan Shadow, MD, 600 mg at 02/02/19 2000   insulin aspart (novoLOG) injection 0-20 Units, 0-20 Units, Subcutaneous, Q4H, Ouma, Hubbard Hartshorn, NP, 15 Units at 02/02/19 1959   insulin aspart (novoLOG) injection 15 Units, 15 Units, Subcutaneous, TID WC, Ouma, Hubbard Hartshorn, NP, 15 Units at 02/03/19 1247   insulin detemir (LEVEMIR) injection 37 Units, 37 Units, Subcutaneous, Daily, Lynn Ito, MD, 37 Units at 02/03/19 [phone]  MEDLINE mouth rinse, 15 mL, Mouth Rinse, BID, Bryan Shadow, MD, 15 mL at 02/02/19 1009   midodrine (PROAMATINE) tablet 5 mg, 5 mg, Oral, TID WC, Karna Christmas, Fuad, MD, 5 mg at 02/03/19 1226   morphine 2 MG/ML injection 1 mg, 1 mg, Intravenous, Q6H PRN, Vida Rigger, MD   multivitamin with minerals tablet 1 tablet, 1 tablet, Oral, Daily, Pennie Banter, DO, 1 tablet at 02/03/19 0855   pantoprazole (PROTONIX) EC tablet 20 mg, 20 mg, Oral, Daily, Marylu Lund, Freddi Che, MD, 20 mg at 02/03/19 0855   sodium chloride flush (NS) 0.9 % injection 10 mL, 10 mL,  Intravenous, Q12H, Esaw Grandchild A, DO, 10 mL at 02/02/19 2000   sulfamethoxazole-trimethoprim (BACTRIM) 400-80 MG per tablet 1 tablet, 1 tablet, Oral, Daily, Pennie Banter, DO, 1 tablet at 02/03/19 0855    ALLERGIES   Pollen extract     REVIEW OF SYSTEMS    Review of Systems:  Gen:  Denies  fever, sweats, chills weigh loss  HEENT: Denies blurred vision, double vision, ear pain, eye pain, hearing loss, nose bleeds, sore throat Cardiac:  No dizziness, chest pain or heaviness, chest tightness,edema Resp:   Denies cough or sputum porduction, wheezing, hemoptysis, + sob with min activity Gi: Denies swallowing difficulty, stomach pain, nausea or vomiting, diarrhea, constipation, bowel incontinence Gu:  Denies bladder incontinence, burning urine Ext:   Denies Joint pain, stiffness or swelling Skin: Denies  skin rash, easy bruising or bleeding or hives Endoc:  Denies polyuria, polydipsia , polyphagia or weight change Psych:   Denies depression, insomnia or hallucinations   Other:  All other systems negative   VS: BP 123/77 (BP Location: Left Arm)    Pulse (!) 102    Temp 97.8 F (36.6 C) (Oral)    Resp 18    Ht 5\' 6"  (1.676 m)    Wt 61.5 kg    SpO2 92%    BMI 21.89 kg/m      PHYSICAL EXAM    GENERAL:NAD, no fevers, chills, no weakness no fatigue HEAD: Normocephalic, atraumatic.  EYES: Pupils equal, round, reactive to light. Extraocular muscles intact. No scleral icterus.  MOUTH: Moist mucosal membrane. Dentition intact. No abscess noted.  EAR, NOSE, THROAT: Clear without exudates. No external lesions.  NECK: Supple. No thyromegaly. No nodules. No JVD.  PULMONARY: Diffuse coarse rhonchi right sided +wheezes CARDIOVASCULAR: S1 and S2. Regular rate and rhythm. No murmurs, rubs, or gallops. No edema. Pedal pulses 2+ bilaterally.  GASTROINTESTINAL: Soft, nontender, nondistended. No masses. Positive bowel sounds. No hepatosplenomegaly.  MUSCULOSKELETAL: No swelling,  clubbing, or edema. Range of motion full in all extremities.  NEUROLOGIC: Cranial nerves II through XII are intact. No gross focal neurological deficits. Sensation intact. Reflexes intact.  SKIN: No ulceration, lesions, rashes, or cyanosis. Skin warm and dry. Turgor intact.  PSYCHIATRIC: Mood, affect within normal limits. The patient is awake, alert and oriented x 3. Insight, judgment intact.       IMAGING    Dg Chest 1 View  Result Date: 01/09/2019 CLINICAL DATA:  Cough, hyperglycemia EXAM: CHEST  1 VIEW COMPARISON:  01/02/2019 FINDINGS: Diffuse patchy opacities and interstitial prominence throughout the lungs, stable since prior study. Heart is normal size. No visible effusions or acute bony abnormality. IMPRESSION: Severe patchy opacities and interstitial prominence, unchanged since prior study. Electronically Signed   By: 01/04/2019 M.D.   On: 01/09/2019 23:44   Ct Angio Chest Pe W/cm &/or Wo Cm  Result Date: 01/24/2019 CLINICAL DATA:  Shortness of breath.  Recent COVID-19 pneumonia EXAM: CT ANGIOGRAPHY CHEST WITH CONTRAST TECHNIQUE: Multidetector CT imaging of the chest was performed using the standard protocol during bolus administration of intravenous contrast. Multiplanar CT image reconstructions and MIPs were obtained to evaluate the vascular anatomy. CONTRAST:  85mL OMNIPAQUE IOHEXOL 350 MG/ML SOLN COMPARISON:  Chest radiograph January 24, 2019 and chest CT January 03, 2019 FINDINGS: Cardiovascular: There is no demonstrable pulmonary embolus. There is no thoracic aortic aneurysm or dissection. Visualized great vessels appear unremarkable. Note that the right innominate and left common carotid arteries arise as a common trunk, an anatomic variant. No evident pericardial effusion or pericardial thickening. Main pulmonary artery measures 3.1 cm, borderline for pulmonary arterial hypertension. Mediastinum/Nodes: Thyroid appears unremarkable. There is a subcarinal lymph node measuring 1.7 x  1.3 cm, slightly increased in size compared to the previous study. There are scattered subcentimeter mediastinal lymph nodes elsewhere. There is a small hiatal hernia. Lungs/Pleura: There is underlying emphysematous change. There is extensive fibrosis throughout the lungs bilaterally. There are areas of associated atelectatic change. There is no frank airspace consolidation evident currently. There are no pleural effusions. There is a degree of lower lobe traction bronchiectasis. Upper Abdomen: There is mild reflux of contrast into the inferior vena cava and hepatic veins. Visualized upper abdominal structures otherwise appear unremarkable. Musculoskeletal: No blastic or lytic bone lesions. No chest wall lesions are evident. Review of the MIP images confirms the above findings. IMPRESSION: 1. No demonstrable pulmonary embolus. No thoracic aortic aneurysm or dissection. 2. Underlying emphysematous change with extensive fibrosis throughout the lungs bilaterally. Areas of atelectatic change noted. No well-defined airspace consolidation. A degree of residual viral pneumonitis is possible intermingled with this extensive fibrosis. There is also lower lobe bronchiectatic change bilaterally. 3. Prominent subcarinal lymph node which potentially may be due to the parenchymal lung changes. No other frank adenopathy. 4. Borderline prominent main pulmonary outflow tract, a finding that may be indicative of a degree of underlying pulmonary arterial hypertension. 5. Mild reflux of contrast into the inferior vena cava and hepatic veins, a finding that may be indicative of a degree of increased right heart pressure. 6.  Small hiatal hernia. Emphysema (ICD10-J43.9). Electronically Signed   By: Lowella Grip III M.D.   On: 01/24/2019 10:15   Dg Chest Port 1 View  Result Date: 01/24/2019 CLINICAL DATA:  Shortness of breath. COVID-19 positive last month. EXAM: PORTABLE CHEST 1 VIEW COMPARISON:  01/09/2019 FINDINGS: The  cardiomediastinal silhouette is unchanged. The lungs are hypoinflated. Widespread coarse interstitial and patchy airspace opacities throughout both lungs are unchanged. No sizable pleural effusion or pneumothorax is identified. No acute osseous abnormality is seen. IMPRESSION: Unchanged extensive interstitial and patchy airspace opacities bilaterally. Electronically Signed   By: Logan Bores M.D.   On: 01/24/2019 08:10      ASSESSMENT/PLAN  This is a pleasant 68 yo male, s/p Covid 19 with impending respiratory failure, in the setting of copd. He is having post covid persistent dyspnea and hypoxia.  He seem to think his dyspnea worsen when his prednisone was at the end of the taper. 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 He does appears to have a cardiac cause nor pulmonary embolism. Presently no signs of muscle or diaphragm weakness. (myasthenia gravis and GB has been reported with Covid infection). Hence I agree with the course of care being rendered. If no new findings he can go home when we get the fio2 down below 4  liters with out patient follow up in pulmonary clinic Of note only xray could find before covid was 2003. cxr was clear. Hence cannot rule in if he had fibrosis before 10/20.  +ckd 3. -incentive spirometry -dvt prophylaxis -copd regimen as is -bactrim prophylaxis will on the steroids -on d/c will extend prdnisone x 2 weeks or so (prenisone 10 mg q day) -wean o2 as tolerated, keep sats > 90 % -agree to hold lasix -ace and anca pending -following with you Thanks for including us in his care       Thank you for allowing me to participate in the care of this patient.   Patient was satisfied with care plan and all questions have been answered.   45 min. Visit     Ned ClinesHerbon Shenica Holzheimer, M.D.  Division of Pulmonary & Critical Care Medicine  [facility]

## 2019-02-04 LAB — GLUCOSE, CAPILLARY
Glucose-Capillary: 119 mg/dL — ABNORMAL HIGH (ref 70–99)
Glucose-Capillary: 130 mg/dL — ABNORMAL HIGH (ref 70–99)
Glucose-Capillary: 134 mg/dL — ABNORMAL HIGH (ref 70–99)
Glucose-Capillary: 146 mg/dL — ABNORMAL HIGH (ref 70–99)
Glucose-Capillary: 195 mg/dL — ABNORMAL HIGH (ref 70–99)
Glucose-Capillary: 312 mg/dL — ABNORMAL HIGH (ref 70–99)
Glucose-Capillary: 81 mg/dL (ref 70–99)

## 2019-02-04 LAB — ANCA TITERS
Atypical P-ANCA titer: <1:20 {titer}
C-ANCA: <1:20 {titer}
P-ANCA: <1:20 {titer}

## 2019-02-04 LAB — ANGIOTENSIN CONVERTING ENZYME: Angiotensin-Converting Enzyme: 22 U/L (ref 14–82)

## 2019-02-04 NOTE — Care Management Important Message (Signed)
Important Message  Patient Details  Name: Bryan Oconnor MRN: [id number] Date of Birth: [birth year]   Medicare Important Message Given:  Yes     Juliann Pulse A Tukker Byrns 02/04/2019, 2:37 PM

## 2019-02-04 NOTE — Progress Notes (Signed)
PROGRESS NOTE    TEJ MURDAUGH  JYN:[id number] DOB: [birth year] DOA: 01/24/2019 PCP: System, Pcp Not In    Brief Narrative:  68 y.o.malewith medical history significant foremphysema,hypertension, diabetes mellitus, CKD stage III, recent COVID-19 pneumonia with hospitalization through 10/12/2022to10/17/2020, hospitalized again from10/29/2020 to 01/07/2019,chronic hypoxemic respiratory failure on 3 L/min oxygen via nasal cannula at home.He presented to the hospital with worsening shortness of breathpast 2 days as he neared the end of steroid taper, but had been ongoing since he wasdischarged from the hospitalon 3L/min oxygen.Also reported a single episode of left-sided chest pain earlier that morning. Patient checked O2 sat at home, it was 68%, prompting him to come to ED. In the ED, patient requiring 6 L/min oxygen by high flow nasal cannula. CT of the chest was done but there was no evidence of acute pulmonary embolism or any other acute cardiothoracic abnormality. His troponin was elevated at 262so he was started on IV heparin infusion forpossibleNSTEMI.Admitted for further evaluation and management. Cardiology consulted, recommend stop heparin, as troponin elevation likely do to demand ischemia in setting of hypoxia. Pulmonary consulted for acute on chronic respiratory failure with hypoxia in patient with post-COVID pulmonary fibrosis. Started dexamethasone. Patient requiring 5-8 L/min HFNC to maintain oxygen saturations.    Consultants:   Rosanne Gutting, cardiology, pulmonology  Procedures: None  Antimicrobials:   Bactrim   Subjective: Still requiring 5 L nasal cannula.  Still dyspneic on exertion no new symptoms or complaints  Objective: Vitals:   02/03/19 1452 02/03/19 2012 02/04/19 0505 02/04/19 0741  BP: 123/77 123/80 129/80 124/76  Pulse: (!) 102 (!) 110 86 85  Resp: 18 20 20 19   Temp: 97.8 F (36.6 C) 98.5 F (36.9 C) 98.1 F (36.7 C) 98.1 F (36.7 C)   TempSrc: Oral Oral Oral Oral  SpO2: 92% 91% 94% 96%  Weight:      Height:        Intake/Output Summary (Last 24 hours) at 02/04/2019 1043 Last data filed at 02/04/2019 1022 Gross per 24 hour  Intake 1210 ml  Output 350 ml  Net 860 ml   Filed Weights   02/01/19 0353 02/02/19 0358 02/03/19 0428  Weight: 61.2 kg 62 kg 61.5 kg    Examination:  General exam: Appears calm and comfortable , nad, less breathless unless moving Respiratory system: dry crackles b/l, mostly at bases, no wheezing or rhonchi Cardiovascular system: Regular S1 & S2 heard,.no murmur/rubs/gallop Gastrointestinal system: Abdomen soft, nt/nd +bs Central nervous system: Alert and oriented x3. No focal neurological deficits. Extremities: No edema or cyanosis Skin: warm, dry Psychiatry: Judgement and insight appear normal. Mood & affect appropriate .    Data Reviewed: I have personally reviewed following labs and imaging studies  CBC: No results for input(s): WBC, NEUTROABS, HGB, HCT, MCV, PLT in the last 168 hours. Basic Metabolic Panel: Recent Labs  Lab 01/29/19 0431 01/30/19 0451 02/01/19 0450 02/03/19 0446  NA 134* 137 137 138  K 5.2* 4.4 4.4 4.3  CL 101 102 101 102  CO2 25 23 25 25   GLUCOSE 278* 224* 164* 102*  BUN 49* 46* 47* 47*  CREATININE 1.52* 1.32* 1.24 1.22  CALCIUM 8.9 9.1 9.1 9.2  MG 2.0 2.0  --   --    GFR: Estimated Creatinine Clearance: 50.4 mL/min (by C-G formula based on SCr of 1.22 mg/dL). Liver Function Tests: No results for input(s): AST, ALT, ALKPHOS, BILITOT, PROT, ALBUMIN in the last 168 hours. No results for input(s): LIPASE, AMYLASE in the last  168 hours. No results for input(s): AMMONIA in the last 168 hours. Coagulation Profile: No results for input(s): INR, PROTIME in the last 168 hours. Cardiac Enzymes: No results for input(s): CKTOTAL, CKMB, CKMBINDEX, TROPONINI in the last 168 hours. BNP (last 3 results) No results for input(s): PROBNP in the last 8760 hours.  HbA1C: No results for input(s): HGBA1C in the last 72 hours. CBG: Recent Labs  Lab 02/03/19 1635 02/03/19 2013 02/04/19 0010 02/04/19 0512 02/04/19 0742  GLUCAP 189* 391* 130* 146* 81   Lipid Profile: No results for input(s): CHOL, HDL, LDLCALC, TRIG, CHOLHDL, LDLDIRECT in the last 72 hours. Thyroid Function Tests: No results for input(s): TSH, T4TOTAL, FREET4, T3FREE, THYROIDAB in the last 72 hours. Anemia Panel: No results for input(s): VITAMINB12, FOLATE, FERRITIN, TIBC, IRON, RETICCTPCT in the last 72 hours. Sepsis Labs: No results for input(s): PROCALCITON, LATICACIDVEN in the last 168 hours.  No results found for this or any previous visit (from the past 240 hour(s)).       Radiology Studies: No results found.      Scheduled Meds: . aspirin EC  81 mg Oral Daily  . atorvastatin  40 mg Oral Daily  . dexamethasone  6 mg Oral Daily  . enoxaparin (LOVENOX) injection  40 mg Subcutaneous Q24H  . feeding supplement (NEPRO CARB STEADY)  237 mL Oral TID BM  . finasteride  5 mg Oral Daily  . gabapentin  600 mg Oral QHS  . insulin aspart  0-20 Units Subcutaneous Q4H  . insulin aspart  15 Units Subcutaneous TID WC  . insulin detemir  37 Units Subcutaneous Daily  . mouth rinse  15 mL Mouth Rinse BID  . midodrine  5 mg Oral TID WC  . multivitamin with minerals  1 tablet Oral Daily  . pantoprazole  20 mg Oral Daily  . sodium chloride flush  10 mL Intravenous Q12H  . sulfamethoxazole-trimethoprim  1 tablet Oral Daily   Continuous Infusions:  Assessment & Plan:   Principal Problem:   Acute on chronic respiratory failure with hypoxia (HCC) Active Problems:   Essential hypertension   NSTEMI (non-ST elevated myocardial infarction) (HCC)   Elevated troponin   COVID-19 virus detected   Pulmonary fibrosis, postinflammatory (HCC)   Emphysema of lung (HCC)   Sinus tachycardia   AKI (acute kidney injury) (HCC)   Acute on chronic respiratory failure with hypoxia  secondary to post-Covid pulmonary fibrosis with prior Covid pneumonia and ARDS COPD/emphysema-not acutely exacerbated - Secondary to recent Covid infection and ARDS, likely developing post Covid pulmonary fibrosis. -weaned down to 4L,on rest.  -Pulmonary following-we will like to wean down to less than FiO2 <4L before d/c  With outpatient follow-up with pulmonary clinic with Dr. Meredeth IdeFleming before discharging -Dexamethasone 6 mg daily-Per pulmonary extend prednisone x2 weeks (pednisone 10mg  qd) -Bactrim DS twice daily for PCP prophylaxis while on steroids -DC Lasix as pulmonary discussion with me likely does not need Lasix at this time -DuoNebs every 6 hours as needed -Albuterol every 4 hours as needed -Incentive spirometer -Supplemental oxygen to maintain O2 sats >90% --Pulmonary rehab after d/c -f/u ace and ANCA pending   Recent COVID Pneumonia COVID-19 virus detected-  patient's initial positive COVID-19 test was on 12/11/2018 and again 11/19.  [facility]. Since positive test >28 days ago and patient without new fevers, this is most likelyresidual positive from prior infection.   No need for airborne precautions.    Sinus tachycardia-resolved. Most likely secondary  to #1  -CTA chest on admission was negative for PE.  No signs of infection -monitor on telemetry -No indication for rate control medications in this setting   Acute Kidney Injury- Resolved -Likelysecondary to diuresis -d/c'd lasix now as he is euvolemic. - hold avoid nephrotoxic meds - renally dose meds as indicated    NSTEMI-ruled out Elevated troponin-due to demand ischemia in the setting of hypoxia, not acs -cards had seen pt.  -no further cardiac diagnostics necessary at this time. -recommended asa. 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 Worsening hyperglycemia secondary to steroids -Hold oral agents -Levemir was increasedfrom 35units to 37 U daily, now BG level improved -NovoLog 20 units 3 times daily with meals -Continuesliding scale moderate  -Continue home gabapentin -Continue aspirin  Hypotension -Started on midodrine   Essential hypertension -was  borderline blood pressures, currently stable.  Does not appear to be on antihypertensives at home at this time.  Hyperlipidemia-continue home atorvastatin  GERD-continue Pepcid  BPH-continue finasteride    DVT prophylaxis: Lovenox Code Status:full Family Communication: no one at bedside Disposition Plan: Likely will be here 2 midnight days as still requiring higher FIO2.       LOS: 11 days   Time spent: 45 minutes with more than 50% on COC  Lynn Ito, MD [facility] Pager [phone]  If 7PM-7AM, please contact night-coverage [email or web address] Password [facility] 02/04/2019, 10:43 AM Patient ID: LAIKEN NOHR, male   DOB: 11/30/1950, 68 y.o.   MRN: [id number]  Patient ID: KAISEN ACKERS, male   DOB: 20-Aug-1950, 68 y.o.   MRN: [id number]

## 2019-02-04 NOTE — Progress Notes (Signed)
Occupational Therapy Treatment Patient Details Name: Bryan Oconnor MRN: [id number] DOB: 30-Nov-1950 Today's Date: 02/04/2019    History of present illness Bryan Oconnor is a 71yoM who [facility] worsening SOB. Pt was hospitalized at [facility] twice for COVID-19 10/12-10/17, then 10/29-11/2. Pt has been at home with O2 since.   OT comments  Pt is now able to complete grooming and UB bathing skills and peri area but continues to feel fatigued after he completes with decrease in O2 sats but able to recover with pursed lip breathing.  Discussed why this was and how to keep progressing activity level with use of energy coservation tech and pursed lip breathing.  He is currently on 5L of O2 and discussed recommendations of AD for ADLs and given an AE catalog as a resource.  Also rec he have a chair in hallway with a back on it and a shower chair with back and no stools rec due to risk of falling.  Continue to facilitate independence in ADLs and functional endurance and set up of home to ensure safety and decrease risk of falls.   Follow Up Recommendations  Home health OT    Equipment Recommendations  3 in 1 bedside commode;Other (comment)(reacher, LH shoe horn, LH sponge 3 in 1 over toilet)    Recommendations for Other Services      Precautions / Restrictions Precautions Precautions: Fall Precaution Comments: Moderate fall; HFNC for activity Restrictions Weight Bearing Restrictions: No       Mobility Bed Mobility                  Transfers                      Balance                                           ADL either performed or assessed with clinical judgement   ADL Overall ADL's : Needs assistance/impaired                                       General ADL Comments: Pt is now able to complete grooming and UB bathing skills and peri area but continues to feel fatigued after he completes.  Discussed why this was and  how to keep progressing activity level with use of energy coservation tech and pursed lip breathing.  He is currently on 5L of O2 and discussed recommendations of AD for ADLs and given an AE catalog as a resource.  ALso rec he have a chair in hallway with a back on it and a shower chair with back and no stools rec due to risk of falling.     Vision Baseline Vision/History: Wears glasses Wears Glasses: At all times Patient Visual Report: No change from baseline     Perception     Praxis      Cognition Arousal/Alertness: Awake/alert Behavior During Therapy: WFL for tasks assessed/performed Overall Cognitive Status: Within Functional Limits for tasks assessed                                          Exercises     Shoulder Instructions  General Comments      Pertinent Vitals/ Pain       Pain Assessment: No/denies pain  Home Living                                          Prior Functioning/Environment              Frequency  Min 2X/week        Progress Toward Goals  OT Goals(current goals can now be found in the care plan section)  Progress towards OT goals: Progressing toward goals  Acute Rehab OT Goals Patient Stated Goal: have less DOE/SOB wth basic ADL OT Goal Formulation: With patient Time For Goal Achievement: 02/11/19 Potential to Achieve Goals: Good  Plan Discharge plan remains appropriate;Frequency remains appropriate    Co-evaluation                 AM-PAC OT "6 Clicks" Daily Activity     Outcome Measure   Help from another person eating meals?: None Help from another person taking care of personal grooming?: None Help from another person toileting, which includes using toliet, bedpan, or urinal?: None Help from another person bathing (including washing, rinsing, drying)?: A Little Help from another person to put on and taking off regular upper body clothing?: None Help from another person to put  on and taking off regular lower body clothing?: A Little 6 Click Score: 22    End of Session    OT Visit Diagnosis: Other abnormalities of gait and mobility (R26.89)   Activity Tolerance Patient tolerated treatment well   Patient Left in bed;with call bell/phone within reach   Nurse Communication          Time: 0973-5329 OT Time Calculation (min): 25 min  Charges: OT General Charges $OT Visit: 1 Visit OT Treatments $Self Care/Home Management : 23-37 mins  Susanne Borders, OTR/L, [place] ascom [phone] 02/04/19, 3:53 PM

## 2019-02-04 NOTE — Progress Notes (Signed)
Pulmonary Medicine          Date: 02/04/2019,   MRN# [id number] Bryan Oconnor 04/27/1950     AdmissionWeight: 56.7 kg                 CurrentWeight: 61.5 kg  See previous notes Hsee previouere CHIEF COMPLAINT:   F/u visit here  HISTORY OF PRESENT ILLNESS  Phhhoshere hhhhh PAST MEDICAL HISTORY   Past Medical History:  Diagnosis Date   Diabetes mellitus without complication (HCC)    Hypertension      SURGICAL HISTORY   History reviewed. No pertinent surgical history.   FAMILY HISTORY   History reviewed. No pertinent family history.   SOCIAL HISTORY   Social History   Tobacco Use   Smoking status: Never Smoker   Smokeless tobacco: Never Used  Substance Use Topics   Alcohol use: Never    Frequency: Never   Drug use: Not on file     MEDICATIONS    Home Medication:    Current Medication:  Current Facility-Administered Medications:    acetaminophen (TYLENOL) tablet 650 mg, 650 mg, Oral, Q6H PRN **OR** acetaminophen (TYLENOL) suppository 650 mg, 650 mg, Rectal, Q6H PRN, Lurene ShadowAyiku, Bernard, MD   albuterol (VENTOLIN HFA) 108 (90 Base) MCG/ACT inhaler 2 puff, 2 puff, Inhalation, Q4H PRN, Lurene ShadowAyiku, Bernard, MD, 2 puff at 01/25/19 0905   aspirin EC tablet 81 mg, 81 mg, Oral, Daily, Lurene ShadowAyiku, Bernard, MD, 81 mg at 02/04/19 0915   atorvastatin (LIPITOR) tablet 40 mg, 40 mg, Oral, Daily, Lurene ShadowAyiku, Bernard, MD, 40 mg at 02/04/19 0915   dexamethasone (DECADRON) tablet 6 mg, 6 mg, Oral, Daily, Karna ChristmasAleskerov, Fuad, MD, 6 mg at 02/04/19 0915   enoxaparin (LOVENOX) injection 40 mg, 40 mg, Subcutaneous, Q24H, Esaw GrandchildGriffith, Kelly A, DO, 40 mg at 02/03/19 1735   feeding supplement (NEPRO CARB STEADY) liquid 237 mL, 237 mL, Oral, TID BM, Griffith, Kelly A, DO, 237 mL at 02/04/19 0916   finasteride (PROSCAR) tablet 5 mg, 5 mg, Oral, Daily, Lurene ShadowAyiku, Bernard, MD, 5 mg at 02/04/19 0915   gabapentin (NEURONTIN) capsule 600 mg, 600 mg, Oral, QHS, Lurene ShadowAyiku, Bernard, MD, 600  mg at 02/03/19 2055   insulin aspart (novoLOG) injection 0-20 Units, 0-20 Units, Subcutaneous, Q4H, [place], Hubbard HartshornElizabeth Achieng, NP, 15 Units at 02/04/19 1200   insulin aspart (novoLOG) injection 15 Units, 15 Units, Subcutaneous, TID WC, Ouma, Hubbard HartshornElizabeth Achieng, NP, 15 Units at 02/04/19 1200   insulin detemir (LEVEMIR) injection 37 Units, 37 Units, Subcutaneous, Daily, Lynn ItoAmery, Sahar, MD, 37 Units at 02/04/19 0917   MEDLINE mouth rinse, 15 mL, Mouth Rinse, BID, Lurene ShadowAyiku, Bernard, MD, 15 mL at 02/02/19 1009   midodrine (PROAMATINE) tablet 5 mg, 5 mg, Oral, TID WC, Aleskerov, Fuad, MD, 5 mg at 02/04/19 1200   morphine 2 MG/ML injection 1 mg, 1 mg, Intravenous, Q6H PRN, Vida RiggerAleskerov, Fuad, MD   multivitamin with minerals tablet 1 tablet, 1 tablet, Oral, Daily, Esaw GrandchildGriffith, Kelly A, DO, 1 tablet at 02/04/19 0915   pantoprazole (PROTONIX) EC tablet 20 mg, 20 mg, Oral, Daily, Amery, Sahar, MD, 20 mg at 02/04/19 0916   sodium chloride flush (NS) 0.9 % injection 10 mL, 10 mL, Intravenous, Q12H, Esaw GrandchildGriffith, Kelly A, DO, 10 mL at 02/04/19 0917   sulfamethoxazole-trimethoprim (BACTRIM) 400-80 MG per tablet 1 tablet, 1 tablet, Oral, Daily, Esaw GrandchildGriffith, Kelly A, DO, 1 tablet at 02/04/19 0915    ALLERGIES   Pollen extract     REVIEW OF SYSTEMS    Review  of Systems:  Gen:  Denies  fever, sweats, chills weigh loss  HEENT: Denies blurred vision, double vision, ear pain, eye pain, hearing loss, nose bleeds, sore throat Cardiac:  No dizziness, chest pain or heaviness, chest tightness,edema Resp:   Denies cough or sputum porduction, shortness of breath,wheezing, hemoptysis,  Gi: Denies swallowing difficulty, stomach pain, nausea or vomiting, diarrhea, constipation, bowel incontinence Gu:  Denies bladder incontinence, burning urine Ext:   Denies Joint pain, stiffness or swelling Skin: Denies  skin rash, easy bruising or bleeding or hives Endoc:  Denies polyuria, polydipsia , polyphagia or weight change Psych:    Denies depression, insomnia or hallucinations   Other:  All other systems negative   VS: BP 124/76 (BP Location: Left Arm)    Pulse 85    Temp 98.1 F (36.7 C) (Oral)    Resp 19    Ht 5\' 6"  (1.676 m)    Wt 61.5 kg    SpO2 96%    BMI 21.89 kg/m      PHYSICAL EXAM    GENERAL:NAD, no fevers, chills, no weakness no fatigue, [name] at 5 liters HEAD: Normocephalic, atraumatic.  EYES: Pupils equal, round, reactive to light. Extraocular muscles intact. No scleral icterus.  MOUTH: Moist mucosal membrane. Dentition intact. No abscess noted.  EAR, NOSE, THROAT: Clear without exudates. No external lesions.  NECK: Supple. No thyromegaly. No nodules. No JVD.  PULMONARY: Diffuse coarse rhonchi right sided - wheezes CARDIOVASCULAR: S1 and S2. Regular rate and rhythm. No murmurs, rubs, or gallops. No edema. Pedal pulses 2+ bilaterally.  GASTROINTESTINAL: Soft, nontender, nondistended. No masses. Positive bowel sounds. No hepatosplenomegaly.  MUSCULOSKELETAL: No swelling, clubbing, or edema. Range of motion full in all extremities.  NEUROLOGIC: Cranial nerves II through XII are intact. No gross focal neurological deficits. Sensation intact. Reflexes intact.  SKIN: No ulceration, lesions, rashes, or cyanosis. Skin warm and dry. Turgor intact.  PSYCHIATRIC: Mood, affect within normal limits. The patient is awake, alert and oriented x 3. Insight, judgment intact.       IMAGING    Dg Chest 1 View  Result Date: 01/09/2019 CLINICAL DATA:  Cough, hyperglycemia EXAM: CHEST  1 VIEW COMPARISON:  01/02/2019 FINDINGS: Diffuse patchy opacities and interstitial prominence throughout the lungs, stable since prior study. Heart is normal size. No visible effusions or acute bony abnormality. IMPRESSION: Severe patchy opacities and interstitial prominence, unchanged since prior study. Electronically Signed   By: 01/04/2019 M.D.   On: 01/09/2019 23:44   Ct Angio Chest Pe W/cm &/or Wo Cm  Result Date:  01/24/2019 CLINICAL DATA:  Shortness of breath.  Recent COVID-19 pneumonia EXAM: CT ANGIOGRAPHY CHEST WITH CONTRAST TECHNIQUE: Multidetector CT imaging of the chest was performed using the standard protocol during bolus administration of intravenous contrast. Multiplanar CT image reconstructions and MIPs were obtained to evaluate the vascular anatomy. CONTRAST:  33mL OMNIPAQUE IOHEXOL 350 MG/ML SOLN COMPARISON:  Chest radiograph January 24, 2019 and chest CT January 03, 2019 FINDINGS: Cardiovascular: There is no demonstrable pulmonary embolus. There is no thoracic aortic aneurysm or dissection. Visualized great vessels appear unremarkable. Note that the right innominate and left common carotid arteries arise as a common trunk, an anatomic variant. No evident pericardial effusion or pericardial thickening. Main pulmonary artery measures 3.1 cm, borderline for pulmonary arterial hypertension. Mediastinum/Nodes: Thyroid appears unremarkable. There is a subcarinal lymph node measuring 1.7 x 1.3 cm, slightly increased in size compared to the previous study. There are scattered subcentimeter mediastinal lymph nodes elsewhere.  There is a small hiatal hernia. Lungs/Pleura: There is underlying emphysematous change. There is extensive fibrosis throughout the lungs bilaterally. There are areas of associated atelectatic change. There is no frank airspace consolidation evident currently. There are no pleural effusions. There is a degree of lower lobe traction bronchiectasis. Upper Abdomen: There is mild reflux of contrast into the inferior vena cava and hepatic veins. Visualized upper abdominal structures otherwise appear unremarkable. Musculoskeletal: No blastic or lytic bone lesions. No chest wall lesions are evident. Review of the MIP images confirms the above findings. IMPRESSION: 1. No demonstrable pulmonary embolus. No thoracic aortic aneurysm or dissection. 2. Underlying emphysematous change with extensive fibrosis  throughout the lungs bilaterally. Areas of atelectatic change noted. No well-defined airspace consolidation. A degree of residual viral pneumonitis is possible intermingled with this extensive fibrosis. There is also lower lobe bronchiectatic change bilaterally. 3. Prominent subcarinal lymph node which potentially may be due to the parenchymal lung changes. No other frank adenopathy. 4. Borderline prominent main pulmonary outflow tract, a finding that may be indicative of a degree of underlying pulmonary arterial hypertension. 5. Mild reflux of contrast into the inferior vena cava and hepatic veins, a finding that may be indicative of a degree of increased right heart pressure. 6.  Small hiatal hernia. Emphysema (ICD10-J43.9). Electronically Signed   By: Lowella Grip III M.D.   On: 01/24/2019 10:15   Dg Chest Port 1 View  Result Date: 01/24/2019 CLINICAL DATA:  Shortness of breath. COVID-19 positive last month. EXAM: PORTABLE CHEST 1 VIEW COMPARISON:  01/09/2019 FINDINGS: The cardiomediastinal silhouette is unchanged. The lungs are hypoinflated. Widespread coarse interstitial and patchy airspace opacities throughout both lungs are unchanged. No sizable pleural effusion or pneumothorax is identified. No acute osseous abnormality is seen. IMPRESSION: Unchanged extensive interstitial and patchy airspace opacities bilaterally. Electronically Signed   By: Logan Bores M.D.   On: 01/24/2019 08:10      ASSESSMENT/PLAN  This is a pleasant 68 yo male, s/p Covid 67 presented with impending respiratory failure, in the setting of copd. He is having post covid persistent dyspnea and hypoxia. er. 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 He does appears to have a cardiac cause nor pulmonary embolism. Presently no signs of muscle or diaphragm weakness. (myasthenia gravis and GB has been reported with Covid infection). Hence cannot rule in if he had fibrosis before 10/20. Sats trending up.  Weaning fio2 home soon -incentive spirometry -dvt prophylaxis -copd regimen as is -bactrim prophylaxis will on the steroids -decrease fio2 to 4 liters -agree to hold lasix -ace and anca pending -following with you Thanks for including [place] in his care   Thank you for allowing me to participate in the care of this patient.   Patient is satisfied with care plan and all questions have been answered.       Wallene Huh, M.D.  Division of [place]

## 2019-02-05 LAB — GLUCOSE, CAPILLARY
Glucose-Capillary: 141 mg/dL — ABNORMAL HIGH (ref 70–99)
Glucose-Capillary: 123 mg/dL — ABNORMAL HIGH (ref 70–99)
Glucose-Capillary: 142 mg/dL — ABNORMAL HIGH (ref 70–99)
Glucose-Capillary: 156 mg/dL — ABNORMAL HIGH (ref 70–99)
Glucose-Capillary: 215 mg/dL — ABNORMAL HIGH (ref 70–99)
Glucose-Capillary: 322 mg/dL — ABNORMAL HIGH (ref 70–99)

## 2019-02-05 MED ORDER — INSULIN ASPART 100 UNIT/ML ~~LOC~~ SOLN
0.0000 [IU] | Freq: Three times a day (TID) | SUBCUTANEOUS | Status: DC
  Administered 2019-02-05: 09:00:00 3 [IU] via SUBCUTANEOUS
  Administered 2019-02-05: 12:00:00 4 [IU] via SUBCUTANEOUS
  Administered 2019-02-05: 18:00:00 7 [IU] via SUBCUTANEOUS
  Administered 2019-02-06: 3 [IU] via SUBCUTANEOUS
  Administered 2019-02-06: 09:00:00 4 [IU] via SUBCUTANEOUS
  Administered 2019-02-06: 11 [IU] via SUBCUTANEOUS
  Administered 2019-02-07: 13:00:00 4 [IU] via SUBCUTANEOUS
  Filled 2019-02-05 (×7): qty 1

## 2019-02-05 MED ORDER — INSULIN ASPART 100 UNIT/ML ~~LOC~~ SOLN
0.0000 [IU] | Freq: Every day | SUBCUTANEOUS | Status: DC
  Administered 2019-02-05: 4 [IU] via SUBCUTANEOUS
  Filled 2019-02-05: qty 1

## 2019-02-05 MED ORDER — INSULIN ASPART 100 UNIT/ML ~~LOC~~ SOLN: 0.0000 [IU] | Freq: Three times a day (TID) | SUBCUTANEOUS | Status: DC

## 2019-02-05 MED ORDER — OXYMETAZOLINE HCL 0.05 % NA SOLN
1.0000 | Freq: Two times a day (BID) | NASAL | Status: DC
  Administered 2019-02-05 – 2019-02-06 (×3): 1 via NASAL
  Filled 2019-02-05: qty 15

## 2019-02-05 NOTE — Progress Notes (Signed)
Physical Therapy Treatment Patient Details Name: Bryan Oconnor MRN: [id number] DOB: 26-Aug-1950 Today's Date: 02/05/2019    History of Present Illness Bryan Oconnor is a 68yoM who comes to [facility] after worsening SOB. Pt was hospitalized at [facility] twice for COVID-19 10/12-10/17, then 10/29-11/2. Pt has been at home with O2 since.    PT Comments    Gradual improvement in activity tolerance with reduced oxygen requirements this date (tolerataing 160' on 6L with sats 84-85%, recovering >90% within 1-2 min rest).  Patient with fair/good awareness of signs/symptoms of fatigue and need for activity pacing; self-initiating as appropriate. Very motivated to participate/progress as able; limited by endurance and overall activity tolerance.    Follow Up Recommendations  Home health PT     Equipment Recommendations  None recommended by PT    Recommendations for Other Services       Precautions / Restrictions Precautions Precautions: Fall Restrictions Weight Bearing Restrictions: No    Mobility  Bed Mobility               General bed mobility comments: seated in recliner beginning/end of treatment session  Transfers Overall transfer level: Modified independent Equipment used: None             General transfer comment: good LE strength/power  Ambulation/Gait Ambulation/Gait assistance: Supervision;Modified independent (Device/Increase time) Gait Distance (Feet): 200 Feet Assistive device: None       General Gait Details: slow, cautious gait performance; two standing rest periods to complete distance.   Fair awareness of signs/symptoms of fatigue and role of activity pacing; self-initiating standing rest periods as appropriate.  Sats 85-86% for 160' of gait; 83-84 for final 40'; recovering >90% within 2-3 min seated rest (6L throughout gait trial)   Stairs             Wheelchair Mobility    Modified Rankin (Stroke Patients Only)       Balance Overall  balance assessment: Needs assistance   Sitting balance-Leahy Scale: Good       Standing balance-Leahy Scale: Good                              Cognition Arousal/Alertness: Awake/alert Behavior During Therapy: WFL for tasks assessed/performed Overall Cognitive Status: Within Functional Limits for tasks assessed                                        Exercises Other Exercises Other Exercises: Seated therex to promote deep breathing, postural extension and lung/chest wall expansion, 1x10-15 each.  Paced with intermittent rest periods as needed. Other Exercises: Sit/stand x8, sup/mod indep, emphasis on deep breathing with exertion.  Fatigues quickly with desat to 75% with more intensive activity; recovers >88% within 2-3 min seated rest    General Comments        Pertinent Vitals/Pain Pain Assessment: No/denies pain    Home Living                      Prior Function            PT Goals (current goals can now be found in the care plan section) Acute Rehab PT Goals Patient Stated Goal: have less DOE/SOB wth basic ADL PT Goal Formulation: With patient Time For Goal Achievement: 02/11/19 Potential to Achieve Goals: Good Progress towards PT goals: Progressing toward  goals    Frequency    Min 2X/week      PT Plan Current plan remains appropriate    Co-evaluation              AM-PAC PT "6 Clicks" Mobility   Outcome Measure  Help needed turning from your back to your side while in a flat bed without using bedrails?: None Help needed moving from lying on your back to sitting on the side of a flat bed without using bedrails?: None   Help needed standing up from a chair using your arms (e.g., wheelchair or bedside chair)?: None Help needed to walk in hospital room?: A Little Help needed climbing 3-5 steps with a railing? : A Little 6 Click Score: 18    End of Session Equipment Utilized During Treatment: Oxygen Activity  Tolerance: Patient tolerated treatment well;Patient limited by fatigue;Treatment limited secondary to medical complications (Comment) Patient left: in chair;with call bell/phone within reach;with nursing/sitter in room Nurse Communication: Mobility status PT Visit Diagnosis: Unsteadiness on feet (R26.81);Difficulty in walking, not elsewhere classified (R26.2)     Time: 8891-6945 PT Time Calculation (min) (ACUTE ONLY): 36 min  Charges:  $Gait Training: 8-22 mins $Therapeutic Activity: 8-22 mins                     Fishel Wamble H. Owens Shark, PT, DPT, NCS 02/05/19, 9:14 PM [phone]

## 2019-02-05 NOTE — Progress Notes (Signed)
Cardiovascular and Pulmonary Nurse Navigator:    68 year old with medical history significant for emphysema, hypertension, diabetes mellitus, chronic kidney disease stage III, recent COVID-19 pneumonia with hospitalization through 12/17/2018 to 12/22/2018, hospitalized again from 01/03/2019 to 01/07/2019, chronic respiratory failure on 3 L/min oxygen via [place] at home.  Patient presented to the hospital on 01/24/2019 with worsening shortness of breath.  He was nearing the end of his steroid taper.  Upon arrival to ER patient was requiring 6 liters/min oxygen by high flow nasal cannula.  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  Patient ruled out for NSTEMI.   Dr. Raul Del, Pulmonologist, consulted this admission.   Rounded on patient as Dr. Kurtis Bushman has recommended outpatient Pulmonary Rehab for patient.  Patient sitting up in recliner chair talking on the phone and watching television.  Introduced my role and explained to patient we would be talking about outpatient Pulmonary Rehab ([facility]).  Explained to patient that his hospitalist and pulmonologist have recommended the Pulmonary Rehab program for patient.  An overview of the program was provided.  Explained to patient physical therapy has recommended home health physical therapy and that Pulmonary Rehab would start after completion of physical therapy.  Patient verbalized understanding and stated, "I need all the help I can get."    Explained to patient the outpatient Pulmonary Rehab department will contact him by phone in approximately 3 weeks to schedule his first appointment.  Patient verbalized understanding.    Patient appreciative of the above information.    Note:  Referral entered for Pulmonary Rehab with dx of Pulmonary Fibrosis, Dyspnea, and COPD per standing order protocols.  Dr. Raul Del sent secure chat and  requested signature on referral.    Roanna Epley, RN, BSN, [facility]  Cardiovascular & Pulmonary Nurse Navigator  Direct Line: [phone]  Department Phone #: [phone] Fax: [phone]  Email Address: [email or web address]

## 2019-02-05 NOTE — Progress Notes (Signed)
Pulmonary Medicine          Date: 02/05/2019,   MRN# [id number] Bryan Oconnor [birth year]     AdmissionWeight: 56.7 kg                 CurrentWeight: 61.9 kg      CHIEF COMPLAINT:   F/u visit   HISTORY OF PRESENT ILLNESS   This am, comfortable, doe on exertion, rest and exercise 02 sat planned. No new acute sxs. Discussed d/c plans with him. Seem to fully under stand.    PAST MEDICAL HISTORY   Past Medical History:  Diagnosis Date   Diabetes mellitus without complication (HCC)    Hypertension      SURGICAL HISTORY   History reviewed. No pertinent surgical history.   FAMILY HISTORY   History reviewed. No pertinent family history.   SOCIAL HISTORY   Social History   Tobacco Use   Smoking status: Never Smoker   Smokeless tobacco: Never Used  Substance Use Topics   Alcohol use: Never    Frequency: Never   Drug use: Not on file     MEDICATIONS    Home Medication:    Current Medication:  Current Facility-Administered Medications:    acetaminophen (TYLENOL) tablet 650 mg, 650 mg, Oral, Q6H PRN **OR** acetaminophen (TYLENOL) suppository 650 mg, 650 mg, Rectal, Q6H PRN, Lurene Shadow, MD   albuterol (VENTOLIN HFA) 108 (90 Base) MCG/ACT inhaler 2 puff, 2 puff, Inhalation, Q4H PRN, Lurene Shadow, MD, 2 puff at 01/25/19 0905   aspirin EC tablet 81 mg, 81 mg, Oral, Daily, Lurene Shadow, MD, 81 mg at 02/05/19 1048   atorvastatin (LIPITOR) tablet 40 mg, 40 mg, Oral, Daily, Lurene Shadow, MD, 40 mg at 02/05/19 1048   dexamethasone (DECADRON) tablet 6 mg, 6 mg, Oral, Daily, Karna Christmas, Fuad, MD, 6 mg at 02/05/19 1048   enoxaparin (LOVENOX) injection 40 mg, 40 mg, Subcutaneous, Q24H, Esaw Grandchild A, DO, 40 mg at 02/04/19 1333   feeding supplement (NEPRO CARB STEADY) liquid 237 mL, 237 mL, Oral, TID BM, Esaw Grandchild A, DO, 237 mL at 02/04/19 2056   finasteride (PROSCAR) tablet 5 mg, 5 mg, Oral, Daily, Lurene Shadow, MD, 5 mg  at 02/05/19 1048   gabapentin (NEURONTIN) capsule 600 mg, 600 mg, Oral, QHS, Lurene Shadow, MD, 600 mg at 02/04/19 2052   insulin aspart (novoLOG) injection 0-20 Units, 0-20 Units, Subcutaneous, TID WC, Lynn Ito, MD, 3 Units at 02/05/19 0924   insulin aspart (novoLOG) injection 15 Units, 15 Units, Subcutaneous, TID WC, Jimmye Norman, NP, 15 Units at 02/05/19 0923   insulin detemir (LEVEMIR) injection 37 Units, 37 Units, Subcutaneous, Daily, Lynn Ito, MD, 37 Units at 02/05/19 1048   MEDLINE mouth rinse, 15 mL, Mouth Rinse, BID, Lurene Shadow, MD, 15 mL at 02/02/19 1009   midodrine (PROAMATINE) tablet 5 mg, 5 mg, Oral, TID WC, Vida Rigger, MD, 5 mg at 02/05/19 6378   morphine 2 MG/ML injection 1 mg, 1 mg, Intravenous, Q6H PRN, Vida Rigger, MD   multivitamin with minerals tablet 1 tablet, 1 tablet, Oral, Daily, Esaw Grandchild A, DO, 1 tablet at 02/05/19 1048   pantoprazole (PROTONIX) EC tablet 20 mg, 20 mg, Oral, Daily, Amery, Sahar, MD, 20 mg at 02/05/19 1048   sodium chloride flush (NS) 0.9 % injection 10 mL, 10 mL, Intravenous, Q12H, Esaw Grandchild A, DO, 10 mL at 02/05/19 1049   sulfamethoxazole-trimethoprim (BACTRIM) 400-80 MG per tablet 1 tablet, 1 tablet, Oral, Daily, Denton Lank,  Kelly A, DO, 1 tablet at 02/05/19 1048    ALLERGIES   Pollen extract     REVIEW OF SYSTEMS    Review of Systems:  Gen:  Denies  fever, sweats, chills weigh loss  HEENT: Denies blurred vision, double vision, ear pain, eye pain, hearing loss, nose bleeds, sore throat Cardiac:  No dizziness, chest pain or heaviness, chest tightness,edema Resp:   Denies cough or sputum porduction, shortness of breath,wheezing, hemoptysis,  Gi: Denies swallowing difficulty, stomach pain, nausea or vomiting, diarrhea, constipation, bowel incontinence Gu:  Denies bladder incontinence, burning urine Ext:   Denies Joint pain, stiffness or swelling Skin: Denies  skin rash, easy bruising or  bleeding or hives Endoc:  Denies polyuria, polydipsia , polyphagia or weight change Psych:   Denies depression, insomnia or hallucinations   Other:  All other systems negative   VS: BP 124/81 (BP Location: Left Arm)    Pulse 91    Temp 97.7 F (36.5 C) (Oral)    Resp 18    Ht 5\' 6"  (1.676 m)    Wt 61.9 kg    SpO2 95%    BMI 22.02 kg/m   On 3 liters [place] 02   PHYSICAL EXAM    GENERAL:NAD, no fevers, chills, no weakness no fatigue HEAD: Normocephalic, atraumatic.  EYES: Pupils equal, round, reactive to light. Extraocular muscles intact. No scleral icterus.  MOUTH: Moist mucosal membrane. Dentition intact. No abscess noted.  EAR, NOSE, THROAT: Clear without exudates. No external lesions.  NECK: Supple. No thyromegaly. No nodules. No JVD.  PULMONARY: Diffuse coarse rhonchi right sided -wheezes, + basal crackles CARDIOVASCULAR: S1 and S2. Regular rate and rhythm. No murmurs, rubs, or gallops. No edema. Pedal pulses 2+ bilaterally.  GASTROINTESTINAL: Soft, nontender, nondistended. No masses. Positive bowel sounds. No hepatosplenomegaly.  MUSCULOSKELETAL: No swelling, clubbing, or edema. Range of motion full in all extremities.  NEUROLOGIC: Cranial nerves II through XII are intact. No gross focal neurological deficits. Sensation intact. Reflexes intact.  SKIN: No ulceration, lesions, rashes, or cyanosis. Skin warm and dry. Turgor intact.  PSYCHIATRIC: Mood, affect within normal limits. The patient is awake, alert and oriented x 3. Insight, judgment intact.       IMAGING    Dg Chest 1 View  Result Date: 01/09/2019 CLINICAL DATA:  Cough, hyperglycemia EXAM: CHEST  1 VIEW COMPARISON:  01/02/2019 FINDINGS: Diffuse patchy opacities and interstitial prominence throughout the lungs, stable since prior study. Heart is normal size. No visible effusions or acute bony abnormality. IMPRESSION: Severe patchy opacities and interstitial prominence, unchanged since prior study. Electronically Signed    By: Charlett NoseKevin  Dover M.D.   On: 01/09/2019 23:44   Ct Angio Chest Pe W/cm &/or Wo Cm  Result Date: 01/24/2019 CLINICAL DATA:  Shortness of breath.  Recent COVID-19 pneumonia EXAM: CT ANGIOGRAPHY CHEST WITH CONTRAST TECHNIQUE: Multidetector CT imaging of the chest was performed using the standard protocol during bolus administration of intravenous contrast. Multiplanar CT image reconstructions and MIPs were obtained to evaluate the vascular anatomy. CONTRAST:  75mL OMNIPAQUE IOHEXOL 350 MG/ML SOLN COMPARISON:  Chest radiograph January 24, 2019 and chest CT January 03, 2019 FINDINGS: Cardiovascular: There is no demonstrable pulmonary embolus. There is no thoracic aortic aneurysm or dissection. Visualized great vessels appear unremarkable. Note that the right innominate and left common carotid arteries arise as a common trunk, an anatomic variant. No evident pericardial effusion or pericardial thickening. Main pulmonary artery measures 3.1 cm, borderline for pulmonary arterial hypertension. Mediastinum/Nodes: Thyroid appears unremarkable.  There is a subcarinal lymph node measuring 1.7 x 1.3 cm, slightly increased in size compared to the previous study. There are scattered subcentimeter mediastinal lymph nodes elsewhere. There is a small hiatal hernia. Lungs/Pleura: There is underlying emphysematous change. There is extensive fibrosis throughout the lungs bilaterally. There are areas of associated atelectatic change. There is no frank airspace consolidation evident currently. There are no pleural effusions. There is a degree of lower lobe traction bronchiectasis. Upper Abdomen: There is mild reflux of contrast into the inferior vena cava and hepatic veins. Visualized upper abdominal structures otherwise appear unremarkable. Musculoskeletal: No blastic or lytic bone lesions. No chest wall lesions are evident. Review of the MIP images confirms the above findings. IMPRESSION: 1. No demonstrable pulmonary embolus. No  thoracic aortic aneurysm or dissection. 2. Underlying emphysematous change with extensive fibrosis throughout the lungs bilaterally. Areas of atelectatic change noted. No well-defined airspace consolidation. A degree of residual viral pneumonitis is possible intermingled with this extensive fibrosis. There is also lower lobe bronchiectatic change bilaterally. 3. Prominent subcarinal lymph node which potentially may be due to the parenchymal lung changes. No other frank adenopathy. 4. Borderline prominent main pulmonary outflow tract, a finding that may be indicative of a degree of underlying pulmonary arterial hypertension. 5. Mild reflux of contrast into the inferior vena cava and hepatic veins, a finding that may be indicative of a degree of increased right heart pressure. 6.  Small hiatal hernia. Emphysema (ICD10-J43.9). Electronically Signed   By: Lowella Grip III M.D.   On: 01/24/2019 10:15   Dg Chest Port 1 View  Result Date: 01/24/2019 CLINICAL DATA:  Shortness of breath. COVID-19 positive last month. EXAM: PORTABLE CHEST 1 VIEW COMPARISON:  01/09/2019 FINDINGS: The cardiomediastinal silhouette is unchanged. The lungs are hypoinflated. Widespread coarse interstitial and patchy airspace opacities throughout both lungs are unchanged. No sizable pleural effusion or pneumothorax is identified. No acute osseous abnormality is seen. IMPRESSION: Unchanged extensive interstitial and patchy airspace opacities bilaterally. Electronically Signed   By: Logan Bores M.D.   On: 01/24/2019 08:10      ASSESSMENT/PLAN  This is a pleasant 68 yo male, s/p Covid 64 presented with impending respiratory failure, in the setting of copd. He is having post covid persistent dyspnea and hypoxia. er. 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 He does appears to have a cardiac cause nor pulmonary embolism.   -incentive spirometry -dvt prophylaxis -copd regimen as is -bactrim prophylaxis will on the  steroids -prednisone 10 mg q day until he return to see me -3 liters, 5 liters with activity -see me in pulm clinic in 7-10 day, will wean prednisone then if indicated    Thank you for allowing me to participate in the care of this patient.   Patient/Family are satisfied with care plan and all questions have been answered.  This document was prepared using Dragon voice recognition software and may include unintentional dictation errors.     Wallene Huh, M.D.  Division of [place]

## 2019-02-05 NOTE — Progress Notes (Signed)
PROGRESS NOTE    Bryan Oconnor  QQP:[id number] DOB: 07-06-1950 DOA: 01/24/2019 PCP: System, Pcp Not In    Brief Narrative:  68 y.o.malewith medical history significant foremphysema,hypertension, diabetes mellitus, CKD stage III, recent COVID-19 pneumonia with hospitalization through 10/12/2022to10/17/2020, hospitalized again from10/29/2020 to 01/07/2019,chronic hypoxemic respiratory failure on 3 L/min oxygen via nasal cannula at home.He presented to the hospital with worsening shortness of breathpast 2 days as he neared the end of steroid taper, but had been ongoing since he wasdischarged from the hospitalon 3L/min oxygen.Also reported a single episode of left-sided chest pain earlier that morning. Patient checked O2 sat at home, it was 68%, prompting him to come to ED. In the ED, patient requiring 6 L/min oxygen by high flow nasal cannula. CT of the chest was done but there was no evidence of acute pulmonary embolism or any other acute cardiothoracic abnormality. His troponin was elevated at 262so he was started on IV heparin infusion forpossibleNSTEMI.Admitted for further evaluation and management. Cardiology consulted, recommend stop heparin, as troponin elevation likely do to demand ischemia in setting of hypoxia. Pulmonary consulted for acute on chronic respiratory failure with hypoxia in patient with post-COVID pulmonary fibrosis. Started dexamethasone. Patient requiring 5-8 L/min HFNC to maintain oxygen saturations.    Consultants:   Rosanne Gutting, cardiology, pulmonology  Procedures: None  Antimicrobials:   Bactrim   Subjective: Weaned down to 3 L at rest and with ambulation he became hypoxic requiring 4 L satting at 90% and his heart rate shot up to 120 sinus tach . He was breathless. Complaining of some chest tightness was given albuterol. Objective: Vitals:   02/04/19 1736 02/04/19 2053 02/05/19 0358 02/05/19 0818  BP: 124/75 115/81 122/82 124/81  Pulse:  95 87 80 91  Resp:  20 20 18   Temp: 98.2 F (36.8 C) 98 F (36.7 C) 97.6 F (36.4 C) 97.7 F (36.5 C)  TempSrc: Oral Oral Oral Oral  SpO2: 93% 96% 95% 95%  Weight:   61.9 kg   Height:        Intake/Output Summary (Last 24 hours) at 02/05/2019 1226 Last data filed at 02/05/2019 1040 Gross per 24 hour  Intake 960 ml  Output 2000 ml  Net -1040 ml   Filed Weights   02/02/19 0358 02/03/19 0428 02/05/19 0358  Weight: 62 kg 61.5 kg 61.9 kg    Examination:  General exam: Appears calm and comfortable , nad, nontachypneic on rest respiratory system: dry crackles scattered b/l at bases, no rhonchi's or wheezing  cardiovascular system: RRR S1 & S2 heard,.no murmur/rubs/gallop Gastrointestinal system:  soft, nt/nd +bs Central nervous system: Alert and oriented x3. No focal neurological deficits. Extremities: No edema or cyanosis Skin: warm, dry Psychiatry: Judgement and insight appear normal. Mood & affect appropriate .    Data Reviewed: I have personally reviewed following labs and imaging studies  CBC: No results for input(s): WBC, NEUTROABS, HGB, HCT, MCV, PLT in the last 168 hours. Basic Metabolic Panel: Recent Labs  Lab 01/30/19 0451 02/01/19 0450 02/03/19 0446  NA 137 137 138  K 4.4 4.4 4.3  CL 102 101 102  CO2 23 25 25   GLUCOSE 224* 164* 102*  BUN 46* 47* 47*  CREATININE 1.32* 1.24 1.22  CALCIUM 9.1 9.1 9.2  MG 2.0  --   --    GFR: Estimated Creatinine Clearance: 50.7 mL/min (by C-G formula based on SCr of 1.22 mg/dL). Liver Function Tests: No results for input(s): AST, ALT, ALKPHOS, BILITOT, PROT, ALBUMIN in the  last 168 hours. No results for input(s): LIPASE, AMYLASE in the last 168 hours. No results for input(s): AMMONIA in the last 168 hours. Coagulation Profile: No results for input(s): INR, PROTIME in the last 168 hours. Cardiac Enzymes: No results for input(s): CKTOTAL, CKMB, CKMBINDEX, TROPONINI in the last 168 hours. BNP (last 3 results) No results  for input(s): PROBNP in the last 8760 hours. HbA1C: No results for input(s): HGBA1C in the last 72 hours. CBG: Recent Labs  Lab 02/04/19 2338 02/05/19 0356 02/05/19 0518 02/05/19 0817 02/05/19 1130  GLUCAP 119* 141* 123* 142* 156*   Lipid Profile: No results for input(s): CHOL, HDL, LDLCALC, TRIG, CHOLHDL, LDLDIRECT in the last 72 hours. Thyroid Function Tests: No results for input(s): TSH, T4TOTAL, FREET4, T3FREE, THYROIDAB in the last 72 hours. Anemia Panel: No results for input(s): VITAMINB12, FOLATE, FERRITIN, TIBC, IRON, RETICCTPCT in the last 72 hours. Sepsis Labs: No results for input(s): PROCALCITON, LATICACIDVEN in the last 168 hours.  No results found for this or any previous visit (from the past 240 hour(s)).       Radiology Studies: No results found.      Scheduled Meds: . aspirin EC  81 mg Oral Daily  . atorvastatin  40 mg Oral Daily  . dexamethasone  6 mg Oral Daily  . enoxaparin (LOVENOX) injection  40 mg Subcutaneous Q24H  . feeding supplement (NEPRO CARB STEADY)  237 mL Oral TID BM  . finasteride  5 mg Oral Daily  . gabapentin  600 mg Oral QHS  . insulin aspart  0-20 Units Subcutaneous TID WC  . insulin aspart  15 Units Subcutaneous TID WC  . insulin detemir  37 Units Subcutaneous Daily  . mouth rinse  15 mL Mouth Rinse BID  . midodrine  5 mg Oral TID WC  . multivitamin with minerals  1 tablet Oral Daily  . oxymetazoline  1 spray Each Nare BID  . pantoprazole  20 mg Oral Daily  . sodium chloride flush  10 mL Intravenous Q12H  . sulfamethoxazole-trimethoprim  1 tablet Oral Daily   Continuous Infusions:  Assessment & Plan:   Principal Problem:   Acute on chronic respiratory failure with hypoxia (HCC) Active Problems:   Essential hypertension   NSTEMI (non-ST elevated myocardial infarction) (HCC)   Elevated troponin   COVID-19 virus detected   Pulmonary fibrosis, postinflammatory (HCC)   Emphysema of lung (HCC)   Sinus tachycardia    AKI (acute kidney injury) (HCC)   Acute on chronic respiratory failure with hypoxia secondary to post-Covid pulmonary fibrosis with prior Covid pneumonia and ARDS COPD/emphysema-not acutely exacerbated - Secondary to recent Covid infection and ARDS, likely developing post Covid pulmonary fibrosis. -weaned down to 4L,on rest.  -Pulmonary following-we will like to wean down to less than FiO2 <4L before d/c  With outpatient follow-up with pulmonary clinic with Dr. Meredeth Ide before discharging -Dexamethasone 6 mg daily-Per pulmonary extend prednisone x2 weeks (pednisone 10mg  qd) -Bactrim DS twice daily for PCP prophylaxis while on steroids -Lasix was discontinued as he is euvolemic -DuoNebs every 6 hours as needed -Albuterol every 4 hours as needed -Incentive spirometer -Supplemental oxygen to maintain O2 sats >90% --Pulmonary rehab after d/c -f/u ace and ANCA pending -OT recommends home health   Recent COVID Pneumonia COVID-19 virus detected-  patient's initial positive COVID-19 test was on 12/11/2018 and again 11/19.  He subsequently had 2 admissions at [facility]. Since positive test >28 days ago and patient without new fevers, this is most likelyresidual positive  from prior infection.   No need for airborne precautions.    Sinus tachycardia-resolved. Most likely secondary to #1  -CTA chest on admission was negative for PE.  No signs of infection -monitor on telemetry -No indication for rate control medications in this setting   Acute Kidney Injury- Resolved -Likelysecondary to diuresis -Discontinued Lasix - hold avoid nephrotoxic meds - renally dose meds as indicated    NSTEMI-ruled out Elevated troponin-due to demand ischemia in the setting of hypoxia, not acs -cards had seen pt.  -no further cardiac diagnostics necessary at this time. -recommended asa. 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 Worsening hyperglycemia secondary to steroids -Hold oral agents -Levemir was increasedfrom 35units to 37 U daily, now BG level improved -NovoLog 20 units 3 times daily with meals -Continuesliding scale moderate  -Continue home gabapentin -Continue aspirin  Hypotension -Started on midodrine   Essential hypertension -was  borderline blood pressures, currently stable.  Does not appear to be on antihypertensives at home at this time.  Hyperlipidemia-continue home atorvastatin  GERD-continue Pepcid  BPH-continue finasteride    DVT prophylaxis: Lovenox Code Status:full Family Communication: no one at bedside Disposition Plan: Possible DC in 1 to 2 days if able to ambulate and tolerates with oxygen less than 4 L    LOS: 12 days   Time spent: 45 minutes with more than 50% on COC  Lynn ItoSahar Breccan Galant, MD [facility] Pager [phone]  If 7PM-7AM, please contact night-coverage [email or web address] Password [email or web address] 02/05/2019, 12:26 PM Patient ID: Bryan ParadiseCharles E Group, male   DOB: 12/10/1950, 68 y.o.   MRN: [id number]

## 2019-02-05 NOTE — Progress Notes (Signed)
Nutrition Follow Up Note   DOCUMENTATION CODES:   Not applicable  INTERVENTION:   Nepro Shake po TID, each supplement provides 425 kcal and 19 grams protein  MVI daily   Bowel regimen as needed per MD   NUTRITION DIAGNOSIS:   Unintentional weight loss related to acute illness(COVID 19) as evidenced by 20 percent weight loss in 1-2 months.  GOAL:   Patient will meet greater than or equal to 90% of their needs  -progressing   MONITOR:   PO intake, Supplement acceptance, Labs, Weight trends, Skin, I & O's  ASSESSMENT:   68 y.o. male with hx of HTN, DM, CKD, recent COVID diagnosis requiring admission (diagnosed 10/5, admitted 10/12 to 10/17), with resultant post-COVID lung fibrosis (readmitted 10/29 to 11/2) now on 2 L [place] who presents to the emergency department for worsening shortness of breath.   Pt continues to have good appetite and oral intake; pt eating eating 100% of meals and drinking Nepro. Recommend contiuue supplements and MVI. No BM noted over the past week; RD notified. Recommend bowel regimen if needed per MD. Per chart, pt up ~3lbs since admit.   Medications reviewed and include: aspirin, dexamethasone, lovenox, insulin, protonix, MVI, bactrim  Labs reviewed: BUN 47(H) Hgb 10.4(L), Hct 31.5(L)  Diet Order:   Diet Order            Diet Carb Modified Fluid consistency: Thin; Room service appropriate? Yes  Diet effective now             EDUCATION NEEDS:   Not appropriate for education at this time  Skin:  Skin Assessment: Reviewed RN Assessment  Last BM:  11/24- type 4  Height:   Ht Readings from Last 1 Encounters:  01/24/19 5\' 6"  (1.676 m)    Weight:   Wt Readings from Last 1 Encounters:  02/05/19 61.9 kg    Ideal Body Weight:  59 kg  BMI:  Body mass index is 22.02 kg/m.  Estimated Nutritional Needs:   Kcal:  1800-2100kcal/day  Protein:  90-105g/day  Fluid:  >1.5L/day  Koleen Distance MS, RD, LDN Pager #- [phone] Office#-  [phone] After Hours Pager: [phone]

## 2019-02-06 DIAGNOSIS — J9621 Acute and chronic respiratory failure with hypoxia: Secondary | ICD-10-CM

## 2019-02-06 LAB — GLUCOSE, CAPILLARY
Glucose-Capillary: 182 mg/dL — ABNORMAL HIGH (ref 70–99)
Glucose-Capillary: 226 mg/dL — ABNORMAL HIGH (ref 70–99)
Glucose-Capillary: 265 mg/dL — ABNORMAL HIGH (ref 70–99)
Glucose-Capillary: 177 mg/dL — ABNORMAL HIGH (ref 70–99)

## 2019-02-06 NOTE — Progress Notes (Signed)
RN attempted to ambulate patient. Upon standing patient desats on 3L oxygen via [place] to 88%. Pt ambulates to the door and his oxygen is down to 85%. RN increases oxygen to 4L to maintain oxygen sats > 88% per MD order. Pt is not feeling well. He reports weakness and feeling tightness in his chest. RN returns patient to his bed. Pt reports a stuffy nose and unable to breath in oxygen via . MD notified for nose drops to help with stuffy nose.  MD notified verbally with results of ambulation. I will continue to assess.

## 2019-02-06 NOTE — Progress Notes (Signed)
PROGRESS NOTE    Bryan Oconnor  JME:[id number] DOB: [birth year] DOA: 01/24/2019 PCP: System, Pcp Not In    Brief Narrative:  68 y.o.malewith medical history significant foremphysema,hypertension, diabetes mellitus, CKD stage III, recent COVID-19 pneumonia with hospitalization through 10/12/2022to10/17/2020, hospitalized again from10/29/2020 to 01/07/2019,chronic hypoxemic respiratory failure on 3 L/min oxygen via nasal cannula at home.He presented to the hospital with worsening shortness of breathpast 2 days as he neared the end of steroid taper, but had been ongoing since he wasdischarged from the hospitalon 3L/min oxygen.Also reported a single episode of left-sided chest pain earlier that morning. Patient checked O2 sat at home, it was 68%, prompting him to come to ED. In the ED, patient requiring 6 L/min oxygen by high flow nasal cannula. CT of the chest was done but there was no evidence of acute pulmonary embolism or any other acute cardiothoracic abnormality. His troponin was elevated at 262so he was started on IV heparin infusion forpossibleNSTEMI.Admitted for further evaluation and management. Cardiology consulted, recommend stop heparin, as troponin elevation likely do to demand ischemia in setting of hypoxia. Pulmonary consulted for acute on chronic respiratory failure with hypoxia in patient with post-COVID pulmonary fibrosis. Started dexamethasone. Patient requiring 5-8 L/min HFNC to maintain oxygen saturations.    Consultants:   Vidal Schwalbe, cardiology, pulmonology  Procedures: None  Antimicrobials:   Bactrim   Subjective: Was resting on bed this morning on 4 L supplemental oxygen via nasal cannula.  As per report, patient became hypoxic requiring 4 L satting at 90% and his heart rate shot up to 120 sinus tach  And He was breathless yesterday.  Patient denies any shortness of breath while he is at rest.  Objective: Vitals:   02/05/19 1544 02/05/19 2056  02/06/19 0400 02/06/19 0802  BP: (!) 149/82 139/84 (!) 139/91 131/78  Pulse: 92 92 77 75  Resp: 19 (!) 24 20 19   Temp: 97.6 F (36.4 C) 97.7 F (36.5 C) 97.9 F (36.6 C) 97.9 F (36.6 C)  TempSrc: Oral Oral Oral Oral  SpO2: 92% 92% 98% 97%  Weight:   63 kg   Height:        Intake/Output Summary (Last 24 hours) at 02/06/2019 1355 Last data filed at 02/06/2019 1027 Gross per 24 hour  Intake 240 ml  Output 950 ml  Net -710 ml   Filed Weights   02/03/19 0428 02/05/19 0358 02/06/19 0400  Weight: 61.5 kg 61.9 kg 63 kg    Examination:  General exam: Appears calm and comfortable , nad, nontachypneic on rest respiratory system: dry crackles scattered b/l at bases, no rhonchi's or wheezing  cardiovascular system: RRR S1 & S2 heard,.no murmur/rubs/gallop Gastrointestinal system:  soft, nt/nd +bs Central nervous system: Alert and oriented x3. No focal neurological deficits. Extremities: No edema or cyanosis Skin: warm, dry Psychiatry: Judgement and insight appear normal. Mood & affect appropriate .   Data Reviewed: I have personally reviewed following labs and imaging studies  CBC: No results for input(s): WBC, NEUTROABS, HGB, HCT, MCV, PLT in the last 168 hours. Basic Metabolic Panel: Recent Labs  Lab 02/01/19 0450 02/03/19 0446  NA 137 138  K 4.4 4.3  CL 101 102  CO2 25 25  GLUCOSE 164* 102*  BUN 47* 47*  CREATININE 1.24 1.22  CALCIUM 9.1 9.2   GFR: Estimated Creatinine Clearance: 51.6 mL/min (by C-G formula based on SCr of 1.22 mg/dL). Liver Function Tests: No results for input(s): AST, ALT, ALKPHOS, BILITOT, PROT, ALBUMIN in the last 168  hours. No results for input(s): LIPASE, AMYLASE in the last 168 hours. No results for input(s): AMMONIA in the last 168 hours. Coagulation Profile: No results for input(s): INR, PROTIME in the last 168 hours. Cardiac Enzymes: No results for input(s): CKTOTAL, CKMB, CKMBINDEX, TROPONINI in the last 168 hours. BNP (last 3  results) No results for input(s): PROBNP in the last 8760 hours. HbA1C: No results for input(s): HGBA1C in the last 72 hours. CBG: Recent Labs  Lab 02/05/19 1130 02/05/19 1645 02/05/19 2058 02/06/19 0801 02/06/19 1127  GLUCAP 156* 215* 322* 182* 226*   Lipid Profile: No results for input(s): CHOL, HDL, LDLCALC, TRIG, CHOLHDL, LDLDIRECT in the last 72 hours. Thyroid Function Tests: No results for input(s): TSH, T4TOTAL, FREET4, T3FREE, THYROIDAB in the last 72 hours. Anemia Panel: No results for input(s): VITAMINB12, FOLATE, FERRITIN, TIBC, IRON, RETICCTPCT in the last 72 hours. Sepsis Labs: No results for input(s): PROCALCITON, LATICACIDVEN in the last 168 hours.  No results found for this or any previous visit (from the past 240 hour(s)).    Radiology Studies: No results found.   Scheduled Meds: . aspirin EC  81 mg Oral Daily  . atorvastatin  40 mg Oral Daily  . dexamethasone  6 mg Oral Daily  . enoxaparin (LOVENOX) injection  40 mg Subcutaneous Q24H  . feeding supplement (NEPRO CARB STEADY)  237 mL Oral TID BM  . finasteride  5 mg Oral Daily  . gabapentin  600 mg Oral QHS  . insulin aspart  0-20 Units Subcutaneous TID WC  . insulin aspart  0-5 Units Subcutaneous QHS  . insulin aspart  15 Units Subcutaneous TID WC  . insulin detemir  37 Units Subcutaneous Daily  . mouth rinse  15 mL Mouth Rinse BID  . midodrine  5 mg Oral TID WC  . multivitamin with minerals  1 tablet Oral Daily  . oxymetazoline  1 spray Each Nare BID  . pantoprazole  20 mg Oral Daily  . sodium chloride flush  10 mL Intravenous Q12H  . sulfamethoxazole-trimethoprim  1 tablet Oral Daily   Continuous Infusions:  Assessment & Plan:   Principal Problem:   Acute on chronic respiratory failure with hypoxia (HCC) Active Problems:   Essential hypertension   NSTEMI (non-ST elevated myocardial infarction) (HCC)   Elevated troponin   COVID-19 virus detected   Pulmonary fibrosis, postinflammatory  (HCC)   Emphysema of lung (HCC)   Sinus tachycardia   AKI (acute kidney injury) ([place])   Acute on chronic respiratory failure with hypoxia secondary to post-Covid pulmonary fibrosis with prior Covid pneumonia and ARDS COPD/emphysema-not acutely exacerbated unless patient ambulates - Secondary to recent Covid infection and ARDS, likely developing post Covid pulmonary fibrosis. -weaned down to 4L,on rest.  -Pulmonary following-we will like to wean down to less than FiO2 <4L before d/c  With outpatient follow-up with pulmonary clinic with Dr. Raul Del in 7 to 10 days -Dexamethasone 6 mg daily-Per pulmonary extend prednisone x2 weeks (pednisone 10mg  qd) -Bactrim DS twice daily for PCP prophylaxis while on steroids -DuoNebs every 6 hours as needed -Albuterol every 4 hours as needed -Incentive spirometer -Supplemental oxygen to maintain O2 sats >90% --Pulmonary rehab after d/c - ace and ANCA within normal limit -OT recommends home health -Wound down to baseline home O2 while keeping the saturation above 90%   Recent COVID Pneumonia COVID-19 virus detected-  patient's initial positive COVID-19 test was on 12/11/2018 and again 11/19.  He subsequently had 2 admissions at [facility]. Since positive  test >28 days ago and patient without new fevers, this is most likelyresidual positive from prior infection.   No need for airborne precautions.  -Follow-up outpatient pulmonology, Dr. Meredeth IdeFleming in 7 to 10 days  Sinus tachycardia-resolved. Most likely secondary to #1  -CTA chest on admission was negative for PE.  No signs of infection -monitor on telemetry -No indication for rate control medications in this setting   Acute Kidney Injury- Resolved -Likelysecondary to diuresis -Discontinued Lasix - hold avoid nephrotoxic meds - renally dose meds as indicated   NSTEMI-ruled out Elevated troponin-due to demand ischemia in the setting of hypoxia, not acs -cards had seen pt.  -no  further cardiac diagnostics necessary at this time. -recommended asa. 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 Worsening hyperglycemia secondary to steroids -Hold oral agents -Levemir was increasedfrom 35units to 37 U daily, now BG level improved -NovoLog 20 units 3 times daily with meals -Continuesliding scale moderate  -Continue home gabapentin -Continue aspirin  Hypotension -Started on midodrine   Essential hypertension -was  borderline blood pressures, currently stable.  Does not appear to be on antihypertensives at home at this time.  Hyperlipidemia-continue home atorvastatin  GERD-continue Pepcid  BPH-continue finasteride  DVT prophylaxis: Lovenox Code Status:full Family Communication: no one at bedside Disposition Plan: Possible DC in 1 to 2 days if able to ambulate and tolerates with oxygen less than 4 L    LOS: 13 days   Time spent: 45 minutes with more than 50% on COC  Thomasenia Bottomsawfikul Tattiana Fakhouri, MD [facility] Pager [phone]  If 7PM-7AM, please contact night-coverage [email or web address] Password [email or web address] 02/06/2019, 1:55 PM Patient ID: Bryan Oconnor, male   DOB: 03/25/1950, 68 y.o.   MRN: [id number]

## 2019-02-06 NOTE — Progress Notes (Signed)
Bedside PFT completed. Patient performed bedside spirometry to the best of his ability, however, throughout procedure patient experienced coughing and shortness of breath. Patient unable to exhale fully without coughing.   FVC PREDICTED: 3.29 FVC ACTUAL: 1.62 FVC % PREDICTED: 49.3%  FEV1 PREDICTED: 2.48 FEV1 ACTUAL: 1.34 FEV1 PREDICTED: 54.0%  FEV1/FVC PREDICTED:76.8 FEV1/FVC ACTUAL: 82.5 FEV1/FVC PREDICTED: 107.4%

## 2019-02-06 NOTE — Progress Notes (Signed)
Inpatient Diabetes Program Recommendations  AACE/ADA: New Consensus Statement on Inpatient Glycemic Control (2015)  Target Ranges:  Prepandial:   less than 140 mg/dL      Peak postprandial:   less than 180 mg/dL (1-2 hours)      Critically ill patients:  140 - 180 mg/dL   Results for EDUAR, KUMPF (MRN [id number]) as of 02/06/2019 13:29  Ref. Range 02/05/2019 08:17 02/05/2019 11:30 02/05/2019 16:45 02/05/2019 20:58  Glucose-Capillary Latest Ref Range: 70 - 99 mg/dL 142 (H)  18 units NOVOLOG  156 (H)  19 units NOVOLOG +  37 units LEVEMIR  215 (H)  22 units NOVOLOG  322 (H)  4 units NOVOLOG    Results for ZECHARIAH, BISSONNETTE (MRN [id number]) as of 02/06/2019 13:29  Ref. Range 02/06/2019 08:01 02/06/2019 11:27  Glucose-Capillary Latest Ref Range: 70 - 99 mg/dL 182 (H)  19 units NOVOLOG +  37 units LEVEMIR  226 (H)  18 units NOVOLOG      Home DM Meds: Glipizide 10 mg Daily       Novolog 2-10 units TID per SSI       Levemir 12 units Daily (NOT taking)       Metformin 1000 mg BID   Current Orders: Levemir 37 units Daily      Novolog Resistant Correction Scale/ SSI (0-20 units) TID AC + HS      Novolog 15 units TID with meals       Getting Decadron 6 mg Daily.  Post meal CBGs still elevated.    MD- Please consider increasing Novolog Meal Coverage to:  Novolog 18 units TID with meals    --Will follow patient during hospitalization--  Wyn Quaker RN, MSN, CDE Diabetes Coordinator Inpatient Glycemic Control Team Team Pager: [phone] (8a-5p)

## 2019-02-06 NOTE — TOC Progression Note (Signed)
Transition of Care [facility]) - Progression Note    Patient Details  Name: Bryan Oconnor MRN: [id number] Date of Birth: 06-12-1950  [facility]) CM/SW [place], LCSW Phone Number: 02/06/2019, 11:35 AM  Clinical Narrative: Per MD in morning progression rounds, patient will likely discharge home tomorrow or Friday. Notified [employer] since they are following for Trilogy and Afflovest. Will need PFT. Left message for [facility] representative to update.   Expected Discharge Plan: [place]    Expected Discharge Plan and Services Expected Discharge Plan: [place] Choice: [place] arrangements for the past 2 months: Single Family Home                                       Social Determinants of Health (SDOH) Interventions    Readmission Risk Interventions Readmission Risk Prevention Plan 01/29/2019  Transportation Screening Complete  HRI or Home Care Consult Complete  Social Work Consult for [place] Planning/Counseling Complete  Palliative Care Screening Not Applicable  Medication Review Press photographer) Complete  Some recent data might be hidden

## 2019-02-06 NOTE — Progress Notes (Addendum)
Occupational Therapy Treatment Patient Details Name: Bryan Oconnor MRN: [id number] DOB: 1951/02/19 Today's Date: 02/06/2019    History of present illness Ashely Joshua is a 68yoM who comes to [facility] after worsening SOB. Pt was hospitalized at [facility] twice for COVID-19 10/12-10/17, then 10/29-11/2. Pt has been at home with O2 since.   OT comments  Pt. education was provided about A/E, energy conservation, and work simplification techniques. Pt. was on 3LO2 upon arrival. Pt. SO2 was 81%, and HR 127 bpms when moving to the EOB. O2 was adjusted to 4LO2 per nursing. SO2 82-89% sitting unsupported at the EOB with HR between 98-111 bpms. SO2 was 84% transitioning back to bed from the EOB. With increased time, rest breaks, and cues for pursed lip breathing SO2 90%, HR: 98. Pt. Continues to present with very limited activity tolerance for activity. Pt. reports that sometimes he feels like something in his chest is blocking air from getting in. Nursing was notified. Pt. continues to benefit from OT services for ADL training, A/E training, and pt. education about energy conservation, and work simplification techniques, home modification, and DME. Pt. plans to return home upon discharge with family to assist pt. as needed. Pt. Will benefit from follow-up HHOT services upon discharge.   Follow Up Recommendations  Home health OT    Equipment Recommendations  3 in 1 bedside commode;Other (comment)    Recommendations for Other Services      Precautions / Restrictions Precautions Precautions: Fall Precaution Comments: Moderate fall; HFNC for activity Restrictions Weight Bearing Restrictions: No       Mobility Bed Mobility Overal bed mobility: Modified Independent             General bed mobility comments: seated in recliner beginning/end of treatment session  Transfers                      Balance                                           ADL either performed or  assessed with clinical judgement   ADL                                         General ADL Comments: Pt. education was provided about enrgy conservation, work simplification techniques, and A/E for ADLs.     Vision Baseline Vision/History: Wears glasses Wears Glasses: At all times Patient Visual Report: No change from baseline     Perception     Praxis      Cognition Arousal/Alertness: Awake/alert Behavior During Therapy: WFL for tasks assessed/performed Overall Cognitive Status: Within Functional Limits for tasks assessed                                          Exercises     Shoulder Instructions       General Comments      Pertinent Vitals/ Pain       Pain Assessment: No/denies pain  Home Living  Prior Functioning/Environment              Frequency  Min 2X/week        Progress Toward Goals  OT Goals(current goals can now be found in the care plan section)  Progress towards OT goals: Progressing toward goals  Acute Rehab OT Goals OT Goal Formulation: With patient Time For Goal Achievement: 02/11/19 Potential to Achieve Goals: Good  Plan Discharge plan remains appropriate;Frequency remains appropriate    Co-evaluation                 AM-PAC OT "6 Clicks" Daily Activity     Outcome Measure   Help from another person eating meals?: None Help from another person taking care of personal grooming?: None Help from another person toileting, which includes using toliet, bedpan, or urinal?: None Help from another person bathing (including washing, rinsing, drying)?: A Little Help from another person to put on and taking off regular upper body clothing?: None Help from another person to put on and taking off regular lower body clothing?: A Little 6 Click Score: 22    End of Session    OT Visit Diagnosis: Other abnormalities of gait and mobility  (R26.89)   Activity Tolerance Patient tolerated treatment well   Patient Left in bed;with call bell/phone within reach   Nurse Communication          Time: 8527-7824 OT Time Calculation (min): 33 min  Charges: OT General Charges $OT Visit: 1 Visit OT Treatments $Self Care/Home Management : 23-37 mins  Harrel Carina, MS, OTR/L  Harrel Carina 02/06/2019, 10:54 AM

## 2019-02-07 DIAGNOSIS — J479 Bronchiectasis, uncomplicated: Secondary | ICD-10-CM

## 2019-02-07 LAB — BLOOD GAS, ARTERIAL
Acid-base deficit: 0.1 mmol/L (ref 0.0–2.0)
Bicarbonate: 23.9 mmol/L (ref 20.0–28.0)
FIO2: 0.36
O2 Saturation: 94.3 %
Patient temperature: 37
pCO2 arterial: 36 mmHg (ref 32.0–48.0)
pH, Arterial: 7.43 (ref 7.350–7.450)
pO2, Arterial: 70 mmHg — ABNORMAL LOW (ref 83.0–108.0)

## 2019-02-07 LAB — GLUCOSE, CAPILLARY
Glucose-Capillary: 113 mg/dL — ABNORMAL HIGH (ref 70–99)
Glucose-Capillary: 166 mg/dL — ABNORMAL HIGH (ref 70–99)

## 2019-02-07 MED ORDER — MIDODRINE HCL 5 MG PO TABS: 5.0000 mg | ORAL_TABLET | Freq: Three times a day (TID) | ORAL | 0 refills | Status: AC

## 2019-02-07 MED ORDER — PANTOPRAZOLE SODIUM 20 MG PO TBEC: 20.0000 mg | DELAYED_RELEASE_TABLET | Freq: Every day | ORAL | 0 refills | Status: AC

## 2019-02-07 MED ORDER — SULFAMETHOXAZOLE-TRIMETHOPRIM 400-80 MG PO TABS: 1.0000 | ORAL_TABLET | Freq: Every day | ORAL | 0 refills | Status: AC

## 2019-02-07 MED ORDER — OXYMETAZOLINE HCL 0.05 % NA SOLN: 1.0000 | Freq: Two times a day (BID) | NASAL | 0 refills | Status: AC

## 2019-02-07 MED ORDER — NEPRO/CARBSTEADY PO LIQD: 237.0000 mL | Freq: Three times a day (TID) | ORAL | 0 refills | Status: AC

## 2019-02-07 MED ORDER — PREDNISONE 5 MG PO TABS: ORAL_TABLET | ORAL | 0 refills | Status: AC

## 2019-02-07 MED ORDER — ORAL CARE MOUTH RINSE: 15.0000 mL | Freq: Two times a day (BID) | OROMUCOSAL | 0 refills | Status: AC

## 2019-02-07 MED ORDER — DEXAMETHASONE 6 MG PO TABS: 6.0000 mg | ORAL_TABLET | Freq: Every day | ORAL | 0 refills | Status: DC

## 2019-02-07 MED ORDER — ADULT MULTIVITAMIN W/MINERALS CH: 1.0000 | ORAL_TABLET | Freq: Every day | ORAL | 0 refills | Status: AC

## 2019-02-07 NOTE — Progress Notes (Signed)
Occupational Therapy Treatment Patient Details Name: Bryan Oconnor MRN: [id number] DOB: 07-11-1950 Today's Date: 02/07/2019    History of present illness Bryan Oconnor is a 40yoM who comes to [facility] after worsening SOB. Pt was hospitalized at [facility] twice for COVID-19 10/12-10/17, then 10/29-11/2. Pt has been at home with O2 since.   OT comments  Pt seen to work on increasing stamina for sitting at EOB for ADLs but pt having difficulty with HR decreasing from 123-125 while sitting up and O2 sats were 90% increasing to 94% but HR did not decrease.  Had him lay down after 5 minutes of sitting up in bed and O2 sats increased to 97% and HR 107-111.  Pt feeling tightness in chest and Respiratory therapy arrived for breathing treatment which he thought would help so session was limited to 10 minutes.  Pt is now on 4L of O2 nasal cannula.  Discussed idea of prone therapy with RT who indicated it would not hurt and may help patient. Will discuss further with team about protocol if this were to be utilized but could be helpful with oxygenation to be able to go home.     Follow Up Recommendations  Home health OT    Equipment Recommendations  3 in 1 bedside commode;Other (comment)    Recommendations for Other Services      Precautions / Restrictions Precautions Precaution Comments: Moderate fall; HFNC for activity Restrictions Weight Bearing Restrictions: No       Mobility Bed Mobility                  Transfers                      Balance                                           ADL either performed or assessed with clinical judgement   ADL Overall ADL's : Needs assistance/impaired                                       General ADL Comments: Pt seen to work on increasing stamina for sitting at EOB for ADLs but pt having difficulty with HR decreasing from 123-125 while sitting up and O2 sats were 90% increasing to 94% but HR did not  decrease.  Had him lay down after 5 minutes of sitting up in bed and O2 sats increased to 97% and HR 107-111.  Pt feeling tightness in chest and Respiratory therapy arrived for breathing treatment which he thought would help so session was limited to 10 minutes.  Pt is now on 4L of O2 nasal cannula.  Discussed idea of prone therapy with RT who indicated it would not hurt and may help patient. Will discuss further with team about protocol if this were to be utilized but could be helpful with oxygenation to be able to go home.     Vision Baseline Vision/History: Wears glasses Wears Glasses: At all times Patient Visual Report: No change from baseline     Perception     Praxis      Cognition Arousal/Alertness: Awake/alert Behavior During Therapy: WFL for tasks assessed/performed Overall Cognitive Status: Within Functional Limits for tasks assessed  Exercises     Shoulder Instructions       General Comments      Pertinent Vitals/ Pain       Pain Assessment: No/denies pain  Home Living                                          Prior Functioning/Environment              Frequency  Min 2X/week        Progress Toward Goals  OT Goals(current goals can now be found in the care plan section)  Progress towards OT goals: Progressing toward goals  Acute Rehab OT Goals Patient Stated Goal: have less DOE/SOB wth basic ADL OT Goal Formulation: With patient Time For Goal Achievement: 02/11/19 Potential to Achieve Goals: Good  Plan Discharge plan remains appropriate;Frequency remains appropriate    Co-evaluation                 AM-PAC OT "6 Clicks" Daily Activity     Outcome Measure   Help from another person eating meals?: None Help from another person taking care of personal grooming?: None Help from another person toileting, which includes using toliet, bedpan, or urinal?: None Help  from another person bathing (including washing, rinsing, drying)?: A Little Help from another person to put on and taking off regular upper body clothing?: None Help from another person to put on and taking off regular lower body clothing?: A Little 6 Click Score: 22    End of Session    OT Visit Diagnosis: Other abnormalities of gait and mobility (R26.89)   Activity Tolerance Patient tolerated treatment well   Patient Left in bed;with call bell/phone within reach   Nurse Communication          Time: 1345-1400 OT Time Calculation (min): 15 min  Charges: OT General Charges $OT Visit: 1 Visit OT Treatments $Self Care/Home Management : 8-22 mins  Susanne Borders, OTR/L, [place] ascom [phone] 02/07/19, 2:11 PM

## 2019-02-07 NOTE — TOC Progression Note (Addendum)
Transition of Care [facility]) - Progression Note    Patient Details  Name: MICHOLAS DRUMWRIGHT MRN: [id number] Date of Birth: [birth year]  Transition of Care [facility]) CM/SW [place], LCSW Phone Number: 02/07/2019, 12:47 PM  Clinical Narrative: Patient gets his oxygen through [facility]. CSW called their representative who said since patient is requiring more than his home amount, he just has to turn his concentrator up. Asked MD to put in home oxygen order to include "liter flow change" and amount needed. Will fax once available. Patient said his wife will bring his oxygen when he is discharged.   1:26 pm: Faxed updated oxygen order to [place]. Representative said they will be able to get the updated equipment to him today.  Expected Discharge Plan: [place]    Expected Discharge Plan and Services Expected Discharge Plan: [place] Choice: Okay arrangements for the past 2 months: Single Family Home                                       Social Determinants of Health (SDOH) Interventions    Readmission Risk Interventions Readmission Risk Prevention Plan 01/29/2019  Transportation Screening Complete  HRI or Home Care Consult Complete  Social Work Consult for Early Planning/Counseling Complete  Palliative Care Screening Not Applicable  Medication Review Press photographer) Complete  Some recent data might be hidden

## 2019-02-07 NOTE — TOC Transition Note (Addendum)
Transition of Care [facility]) - CM/SW Discharge Note   Patient Details  Name: Bryan Oconnor MRN: [id number] Date of Birth: Jul 18, 1950  Transition of Care [facility]) CM/SW Contact:  Candie Chroman, LCSW Phone Number: 02/07/2019, 3:14 PM   Clinical Narrative:  Patient has orders to discharge home today. Alvis Lemmings is aware. Patient said his wife will bring his oxygen when she picks him up. 3-in-1 will be delivered to room. No further concerns. CSW signing off.   3:35 pm: Received call from [employer]. Patient's concentrator will meet oxygen requirement needs for today and they will bring new equipment tomorrow. Patient is aware and agreeable.   Final next level of care: Home w Home Health Services Barriers to Discharge: Barriers Resolved   Patient Goals and CMS Choice   CMS Medicare.gov Compare Post Acute Care list provided to:: Patient Choice offered to / list presented to : Patient  Discharge Placement                Patient to be transferred to facility by: Wife will pick him up.   Patient and family notified of of transfer: 02/07/19  Discharge Plan and Services     Post Acute Care Choice: Home Health          DME Arranged: Oxygen, 3-N-1([employer]) DME Agency: [facility] Date DME Agency Contacted: 02/07/19   Representative spoke with at DME Agency: [employer]: [place], Salena Saner HH Arranged: RN, PT, OT [facility] Agency: [place] Date [place]: 02/07/19   Representative spoke with at [place]: Adela Lank  Social Determinants of Health ([place]) Interventions     Readmission Risk Interventions Readmission Risk Prevention Plan 01/29/2019  Transportation Screening Complete  HRI or [place] Complete  Social Work Consult for [place] Planning/Counseling Complete  Palliative Care Screening Not Applicable  Medication Review Press photographer) Complete  Some recent data might be hidden

## 2019-02-07 NOTE — Discharge Summary (Addendum)
Physician Discharge Summary  Patient ID: AAYUSH GELPI MRN: [id number] DOB/AGE: Aug 09, 1950 68 y.o.  Admit date: 01/24/2019 Discharge date: 02/07/2019  Admission Diagnoses:  Discharge Diagnoses:  Principal Problem:   Acute on chronic respiratory failure with hypoxia [facility]) Active Problems:   Essential hypertension   NSTEMI (non-ST elevated myocardial infarction) (HCC)   Elevated troponin   COVID-19 virus detected   Pulmonary fibrosis, postinflammatory (HCC)   Emphysema of lung (HCC)   Sinus tachycardia   AKI (acute kidney injury) ([place]) Bronchiectasis  Discharged Condition: fair  Hospital Course:  Brief Narrative:  68 y.o.malewith medical history significant foremphysema,hypertension, diabetes mellitus, CKD stage III, recent COVID-19 pneumonia with hospitalization through 10/12/2022to10/17/2020, hospitalized again from10/29/2020 to 01/07/2019,chronic hypoxemic respiratory failure on 3 L/min oxygen via nasal cannula at home.He presented to the hospital with worsening shortness of breathpast 2 days as he neared the end of steroid taper, but had been ongoing since he wasdischarged from the hospitalon 3L/min oxygen.Also reported a single episode of left-sided chest pain earlier that morning. Patient checked O2 sat at home, it was 68%, prompting him to come to ED. In the ED, patient requiring 6 L/min oxygen by high flow nasal cannula. CT of the chest was done but there was no evidence of acute pulmonary embolism or any other acute cardiothoracic abnormality. His troponin was elevated at 262so he was started on IV heparin infusion forpossibleNSTEMI.Admitted for further evaluation and management. Cardiology consulted, recommend stop heparin, as troponin elevation likely do to demand ischemia in setting of hypoxia. Pulmonary consulted for acute on chronic respiratory failure with hypoxia in patient with post-COVID pulmonary fibrosis. Started dexamethasone. Patient  requiring5-8 L/min HFNC to maintain oxygen saturations.  Acute on chronic respiratory failure with hypoxiasecondary to post-Covid pulmonary fibrosis with prior Covid pneumonia and ARDS COPD/emphysemaand bronchiectasis-not acutely exacerbated  anymore -Patient request full liter of supplemental oxygen via nasal cannula at rest and 4 to 6 L up to 6 L on ambulation to maintain saturation above 90%. - Secondary to recent Covid infection and ARDS, likely developing post Covid pulmonary fibrosis. -Pulmonary following-we will like to wean down to less than FiO2 <4L before d/c  With outpatient follow-up with pulmonary clinic with Dr. Raul Del in 7 to 10 days - Per pulmonary extend prednisone x2 weeks (pednisone 10mg  qd) --> Rx given/sent  -Bactrim DS daily for PCP prophylaxis while on steroids --> Rx given  -DuoNebs every 6 hours as needed -Albuterol every 4 hours as needed -Incentive spirometer -Supplemental oxygen to maintain O2 sats >90% same as above --Pulmonary rehab after d/c - ace and ANCA within normal limit -OT recommends home health arranged home health care with  OT   Recent COVID Pneumonia COVID-19 virus detected-  patient's initial positive COVID-19 test was on 12/11/2018 and again 11/19.  He subsequently had 2 admissions at [facility]. Since positive test >28 days ago and patient without new fevers, this is most likelyresidual positive from prior infection. No need for airborne precautions.  -Follow-up outpatient pulmonology, Dr. Raul Del in 7 to 10 days  Sinus tachycardia-resolved. Most likely secondary to #1  -CTA chest on admission was negative for PE.  No signs of infection -No indication for rate control medications in this setting   Acute Kidney Injury- Resolved -Likelysecondary to diuresis -Discontinued Lasix - hold avoid nephrotoxic meds   NSTEMI-ruled out Elevated troponin-due to demand ischemia in the setting of hypoxia, not acs -cards had  seen pt.  -no further cardiac diagnostics necessary at this time. -recommended asa.  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 Worsening hyperglycemia secondary to steroids -Now resume home medications as previously recommended and directed Check blood glucose 3 times daily and bring the log to the PCP for further management if needed -Continue home gabapentin -Continue aspirin  Hypotension -Started on midodrine   Essential hypertension -was  borderline blood pressures, currently stable.  Does not appear to be on antihypertensives at home at this time. -Check BP daily and bring the log to PCP for further management if needed Hyperlipidemia-continue home atorvastatin  GERD-continue Pepcid  BPH-continue finasteride  Consults: Pulmonology  Significant Diagnostic Studies: CT angio and other radiologic images  Treatments: Per hospital course and discharge med list  Discharge Exam: Blood pressure 128/85, pulse 85, temperature 98.2 F (36.8 C), temperature source Oral, resp. rate 18, height 5\' 6"  (1.676 m), weight 63.3 kg, SpO2 99 %.   General exam: Appears calm and comfortable , nad, nontachypneic on rest respiratory system: Coarse sounds bibasilar, no rhonchi's or wheezing  cardiovascular system: RRR S1 & S2 heard,.no murmur/rubs/gallop Gastrointestinal system:  soft, nt/nd +bs Central nervous system: Alert and oriented x3. No focal neurological deficits. Extremities: No edema or cyanosis Skin: warm, dry Psychiatry: Judgement and insight appear normal. Mood & affect appropriate .  Disposition: Discharge disposition: 01-Home or Self Care       Discharge Instructions    AMB referral to pulmonary rehabilitation   Complete by: As directed    Please select a program: Respiratory Care Services Comment - Post Covid  Pulmonary Fibrosis, COPD,    Respiratory Care Services Diagnosis: Dyspnea Comment - Pulmonary Fibrosis   After initial evaluation and assessments completed: Virtual Based Care may be provided alone or in conjunction with Pulmonary Rehab/Respiratory Care services based on patient barriers.: Yes   Patient needing Home Health OT and PT upon discharge.   Bed rest   Complete by: As directed    Per PT/OT   Call MD for:  difficulty breathing, headache or visual disturbances   Complete by: As directed    Call MD for:  temperature >100.4   Complete by: As directed    Diet Carb Modified   Complete by: As directed    Discharge instructions   Complete by: As directed    Covid PNA     Allergies as of 02/07/2019      Reactions   Pollen Extract Cough      Medication List    STOP taking these medications   predniSONE 5 MG tablet Commonly known as: DELTASONE     TAKE these medications   acetaminophen 500 MG tablet Commonly known as: TYLENOL Take 500 mg by mouth every 6 (six) hours as needed for mild pain.   albuterol 108 (90 Base) MCG/ACT inhaler Commonly known as: VENTOLIN HFA Inhale 2 puffs into the lungs every 4 (four) hours.   aspirin EC 81 MG tablet Take 81 mg by mouth daily.   atorvastatin 40 MG tablet Commonly known as: LIPITOR Take 40 mg by mouth daily.   benzonatate 200 MG capsule Commonly known as: TESSALON Take 1 capsule (200 mg total) by mouth 3 (three) times daily.   dexamethasone 6 MG tablet Commonly known as: DECADRON Take 1 tablet (6 mg total) by mouth daily. Start taking on: February 08, 2019   famotidine 20 MG tablet Commonly known as: PEPCID Take 1 tablet (20 mg total) by mouth daily for 10  days.   feeding supplement (NEPRO CARB STEADY) Liqd Take 237 mLs by mouth 3 (three) times daily between meals. For 30 days supply   finasteride 5 MG tablet Commonly known as: PROSCAR Take 5 mg by mouth daily.   gabapentin 300 MG capsule Commonly known as:  NEURONTIN Take 600 mg by mouth at bedtime.   glipiZIDE 10 MG 24 hr tablet Commonly known as: GLUCOTROL XL Take 10 mg by mouth daily.   insulin aspart 100 UNIT/ML injection Commonly known as: NovoLOG Can switch to any other cheaper alternative.. Before each meal 3 times a day , 140-199 - 2 units, 200-250 - 4 units, 251-299 - 6 units,  300-349 - 8 units,  350 or above 10 units. Insulin PEN if approved, provide syringes and needles if needed.   insulin detemir 100 UNIT/ML injection Commonly known as: Levemir Inject 0.12 mLs (12 Units total) into the skin daily. Can switch to any long acting and same dose   metFORMIN 1000 MG tablet Commonly known as: GLUCOPHAGE Take 1,000 mg by mouth 2 (two) times daily with a meal.   midodrine 5 MG tablet Commonly known as: PROAMATINE Take 1 tablet (5 mg total) by mouth 3 (three) times daily with meals.   mouth rinse Liqd solution 15 mLs by Mouth Rinse route 2 (two) times daily. 30 days supply   multivitamin with minerals Tabs tablet Take 1 tablet by mouth daily. Start taking on: February 08, 2019   oxymetazoline 0.05 % nasal spray Commonly known as: AFRIN Place 1 spray into both nostrils 2 (two) times daily.   pantoprazole 20 MG tablet Commonly known as: PROTONIX Take 1 tablet (20 mg total) by mouth daily. Start taking on: February 08, 2019   sulfamethoxazole-trimethoprim 400-80 MG tablet Commonly known as: BACTRIM Take 1 tablet by mouth daily. Start taking on: February 08, 2019            Durable Medical Equipment  (From admission, onward)         Start     Ordered   02/07/19 1317  For home use only DME oxygen  Once    Comments: Liter flow change and that he's a 4 L at rest, 6 L while ambulating.  Question Answer Comment  Length of Need Lifetime   Mode or (Route) Nasal cannula   Liters per Minute 4   Oxygen delivery system Other see comments      02/07/19 1317   02/07/19 1239  For home use only DME 3 n 1  Once     02/07/19  1238         Follow-up Information    [facility] Follow up.   Specialty: Home Health Services Why: They will follow up with you for your home health needs. Contact information: [address] [place] [address] [phone]        [facility] Follow up.   Specialty: Pulmonary Rehab Why: Your pulmonologist (lung specialist) has referred you to outpatient Pulmonary Rehab at [facility].  Pulmonary Rehab should start upon completion of in-home physical therapy.  The Pulmonary Rehab department will contact you in two to three weeks.   Contact information: [address] [id number] [address] [place] [address] [phone]          Signed: [name] 02/07/2019, 2:31 PM

## 2019-02-07 NOTE — Clinical Social Work Note (Signed)
Patient has notated bronchiectasis on recent CT scan. Patient continues to have two or more exacerbations annually and has had a productive cough for greater than 6 months. Patient has been tried and failed on flutter valve and other breathing techniques without success. Patient has also recently been diagnosed with COVID which is contributing to ongoing symptoms.  Bryan Oconnor, CSW [phone]  

## 2019-02-07 NOTE — Plan of Care (Signed)
  Problem: Clinical Measurements: Goal: Ability to maintain clinical measurements within normal limits will improve Outcome: Progressing Goal: Will remain free from infection Outcome: Progressing Goal: Diagnostic test results will improve Outcome: Progressing Goal: Cardiovascular complication will be avoided Outcome: Progressing   Problem: Activity: Goal: Risk for activity intolerance will decrease Outcome: Progressing   Problem: Pain Managment: Goal: General experience of comfort will improve Outcome: Progressing   Problem: Safety: Goal: Ability to remain free from injury will improve Outcome: Progressing   Problem: Activity: Goal: Ability to tolerate increased activity will improve Outcome: Progressing   Problem: Cardiac: Goal: Ability to achieve and maintain adequate cardiovascular perfusion will improve Outcome: Progressing   Problem: Health Behavior/Discharge Planning: Goal: Ability to safely manage health-related needs after discharge will improve Outcome: Progressing

## 2019-02-11 LAB — CULTURE, BLOOD (ROUTINE X 2)
Culture: NO GROWTH
Culture: NO GROWTH

## 2019-02-12 ENCOUNTER — Other Ambulatory Visit: Payer: Self-pay | Admitting: *Deleted

## 2019-02-12 ENCOUNTER — Encounter: Payer: Self-pay | Admitting: *Deleted

## 2019-02-12 DIAGNOSIS — Z9181 History of falling: Secondary | ICD-10-CM | POA: Diagnosis not present

## 2019-02-12 DIAGNOSIS — I214 Non-ST elevation (NSTEMI) myocardial infarction: Secondary | ICD-10-CM | POA: Diagnosis not present

## 2019-02-12 DIAGNOSIS — J80 Acute respiratory distress syndrome: Secondary | ICD-10-CM | POA: Diagnosis not present

## 2019-02-12 DIAGNOSIS — I129 Hypertensive chronic kidney disease with stage 1 through stage 4 chronic kidney disease, or unspecified chronic kidney disease: Secondary | ICD-10-CM | POA: Diagnosis not present

## 2019-02-12 DIAGNOSIS — J1289 Other viral pneumonia: Secondary | ICD-10-CM | POA: Diagnosis not present

## 2019-02-12 DIAGNOSIS — E1142 Type 2 diabetes mellitus with diabetic polyneuropathy: Secondary | ICD-10-CM | POA: Diagnosis not present

## 2019-02-12 DIAGNOSIS — N183 Chronic kidney disease, stage 3 unspecified: Secondary | ICD-10-CM | POA: Diagnosis not present

## 2019-02-12 DIAGNOSIS — Z792 Long term (current) use of antibiotics: Secondary | ICD-10-CM | POA: Diagnosis not present

## 2019-02-12 DIAGNOSIS — J439 Emphysema, unspecified: Secondary | ICD-10-CM | POA: Diagnosis not present

## 2019-02-12 DIAGNOSIS — Z794 Long term (current) use of insulin: Secondary | ICD-10-CM | POA: Diagnosis not present

## 2019-02-12 DIAGNOSIS — J470 Bronchiectasis with acute lower respiratory infection: Secondary | ICD-10-CM | POA: Diagnosis not present

## 2019-02-12 DIAGNOSIS — J8410 Pulmonary fibrosis, unspecified: Secondary | ICD-10-CM | POA: Diagnosis not present

## 2019-02-12 DIAGNOSIS — U071 COVID-19: Secondary | ICD-10-CM | POA: Diagnosis not present

## 2019-02-12 DIAGNOSIS — K219 Gastro-esophageal reflux disease without esophagitis: Secondary | ICD-10-CM | POA: Diagnosis not present

## 2019-02-12 DIAGNOSIS — I959 Hypotension, unspecified: Secondary | ICD-10-CM | POA: Diagnosis not present

## 2019-02-12 DIAGNOSIS — Z7952 Long term (current) use of systemic steroids: Secondary | ICD-10-CM | POA: Diagnosis not present

## 2019-02-12 DIAGNOSIS — N400 Enlarged prostate without lower urinary tract symptoms: Secondary | ICD-10-CM | POA: Diagnosis not present

## 2019-02-12 DIAGNOSIS — Z9981 Dependence on supplemental oxygen: Secondary | ICD-10-CM | POA: Diagnosis not present

## 2019-02-12 DIAGNOSIS — Z7982 Long term (current) use of aspirin: Secondary | ICD-10-CM | POA: Diagnosis not present

## 2019-02-12 DIAGNOSIS — E1122 Type 2 diabetes mellitus with diabetic chronic kidney disease: Secondary | ICD-10-CM | POA: Diagnosis not present

## 2019-02-12 DIAGNOSIS — E785 Hyperlipidemia, unspecified: Secondary | ICD-10-CM | POA: Diagnosis not present

## 2019-02-12 NOTE — Patient Outreach (Signed)
Bryan Oconnor) Care Management  02/12/2019  Bryan Oconnor 03-Jul-1950 [id number]  CSW was able to make initial contact with patient today to perform phone assessment, as well as assess and assist with social work needs and services.  CSW introduced self, explained role and types of services provided through [facility] ([facility]).  CSW further explained to patient that CSW works with the Utilization Management Department for [facility] and that there is cause for concern about patient's recent [facility] admissions and the fact that he contracted the Coronavirus (COVID-19). CSW then explained the reason for the call, indicating that the Utilization Management Department thought that patient would benefit from social work services and resources to assist with finances to help pay for rent and utilities, food resources due to food insecurities and medication assistance.  CSW obtained two HIPAA compliant identifiers from patient, which included patient's name and date of birth.  Patient admitted that he and his wife, Bryan Oconnor are very behind on their monthly bills and medical expenses.  In addition, patient reported that he is currently unable to afford to pay for rent and utilities, having to rely on his three children to pay his rent of $835.00 per month, as well as his utility bills, which typically run about $200.00 per month.  Patient went on to say that he is unable to afford to buy groceries, especially now that he has been placed on a special diet, and may require assistance with paying for medications, in the near future.  CSW agreed to make a referral for patient to [facility] [employer] Service (Mom's Meals), along with making a referral to [place] [facility] on [employer].  The application for Mom's Meals was submitted today for processing and the referral for Meals on Wheels was placed today;  however, there is a waiting list for Meals on Wheels, so patient is aware that he may not receive a call about receiving services for several months.     Patient declined having CSW make a referral for him to a pharmacist, or certified pharmacy assistant, through [facility], at this time, reporting that he just had all of his medications filled and that the total cost was $202.00, which he believes he is able to afford each month, including insulin and syringes.  Patient indicated that he owns a vehicle and that Mrs. Keisling transports him to and from all his physician appointments.  Patient was agreeable to having CSW mail him a [place] Department of Social Services [employer] and a [place] Department of Health and Estate agent for [employer] and [employer], as well as assist with completion, believing that he may actually qualify for financial assistance.  In addition, CSW will mail patient the following list of [place] and Resources:  [employer] of [place]. Address: [address] [place], [place] [address]  Phone: [place]  * Tax Preparation  * [facility] [place] Address: [phone] [address] [place], [place] [address] Phone: [phone] Services Include:    [place] [facility] of Social Services Address: [address] [place], [place] [address] Phone: [phone] Services Include:  * Supplemental Nutrition Assistance Program  * Medicaid  * Fuel Aid  * Food Assistance  * Medication Assistance   [facility] Address: [phone] [address],  [place], [place] [address] Phone: [phone] or [phone] Services Include:   * Family Services   * Transient Assistance  * Emergency Food & Food Pantry  * Fuel  * Scientist, physiological  * Utility Assistance    * Budget Counseling  * General Counseling   * Rental Assistance   * Life Sustaining Medicine  [facility] Address: [address] [place], [place] [address] Phone: [phone] Services Include:  * Case Management  * Emergency Food Assistance   * Congregate Meal Sites   * Nutritional Supplement Program    * Economic Stability  * Utility Bill Program  * Low Income Home Energy Assistance Program  * Weatherization Program  * Housing Services & Counseling   * Home Ownership Program  * Event organiser Program  * Wheels-to-Work Program  [employer] Address: [address], [place], [place] [address] Phone: [phone] Services Include:  * Affordable Housing for [employer], Elderly and Disabled Individuals  * Marine scientist [employer] on Wheels Address: [phone] [address], [place], [place] [address] Phone: [phone] Service Include:  * Home Delivered Hot, Frozen, and Emergency Meals  * Grocery Assistance Program  Food Banks & Food Pantries of [place]:   * [facility]) Address: [address] [place], [place] [address] Phone: [phone]  * Dream Teacher, music Pantry Address: [address] [place], [place] Phone: [phone]  * [facility] Address: [address] [place], [place] Phone: [phone]  * [facility] Address: [phone] [address] [place], [place] [address] Phone: [phone]  * [facility] Address: [address] [place], [place] [address] Phone: [phone]  * [facility] [place] Address: [address] [place], [place] [address] Phone: [phone]  *  [facility] Address: [address], [place], [place] [address] Phone: [phone]  * [facility] Address: [address] [place], [place] Phone: [phone]  CSW will follow-up with patient on Wednesday, February 20, 2019, around 9:00am, to ensure that he received the packet of resource information mailed to his home, to answer any questions that he may have concerning the resources, assist with making referrals to community agencies and resources, assist with completion of applications for [employer] and Food Stamps and to inquire about food services arranged by CSW through [employer] [employer] Service (Mom's Meals).  Patient voiced understanding and was agreeable to this plan, very appreciative of the call.  CSW was able to confirm that patient has the correct contact information for CSW and encouraged to contact CSW directly if additional social work needs arise in the meantime.   Danford Bad, BSW, MSW, LCSW  Licensed Restaurant manager, fast food Health System  Mailing Talent [address] [place], [place] [address] Physical Address[address], [place], [place] [address] Toll Free Main # [phone] Fax # [phone] Cell # [phone]  Office # [phone] [email or web address]

## 2019-02-13 ENCOUNTER — Encounter: Payer: Self-pay | Admitting: *Deleted

## 2019-02-13 DIAGNOSIS — R0602 Shortness of breath: Secondary | ICD-10-CM | POA: Diagnosis not present

## 2019-02-13 DIAGNOSIS — Z09 Encounter for follow-up examination after completed treatment for conditions other than malignant neoplasm: Secondary | ICD-10-CM | POA: Diagnosis not present

## 2019-02-13 DIAGNOSIS — E1340 Other specified diabetes mellitus with diabetic neuropathy, unspecified: Secondary | ICD-10-CM | POA: Diagnosis not present

## 2019-02-13 DIAGNOSIS — J479 Bronchiectasis, uncomplicated: Secondary | ICD-10-CM | POA: Diagnosis not present

## 2019-02-13 DIAGNOSIS — J8410 Pulmonary fibrosis, unspecified: Secondary | ICD-10-CM | POA: Diagnosis not present

## 2019-02-19 DIAGNOSIS — E113393 Type 2 diabetes mellitus with moderate nonproliferative diabetic retinopathy without macular edema, bilateral: Secondary | ICD-10-CM | POA: Diagnosis not present

## 2019-02-20 ENCOUNTER — Encounter: Payer: Self-pay | Admitting: *Deleted

## 2019-02-20 ENCOUNTER — Other Ambulatory Visit: Payer: Self-pay | Admitting: *Deleted

## 2019-02-20 NOTE — Patient Outreach (Signed)
[facility]) Care Management  02/20/2019  Bryan Oconnor [birth year] [id number]   CSW was able to make contact with patient today to follow-up regarding social work services and resources, as well as to ensure that patient received the packet of resource information that CSW mailed to patient's home on Tuesday, February 12, 2019.  Patient confirmed that he received the packet, admitting that he, his wife, Bryan [facility]nor and two of his children have already begun making calls to try and obtain financial assistance to help pay for rent and utilities.  Patient went on to say that he has been approved to receive Mom's Meals and that he and Bryan O[facility]nor are so appreciative of all assistance received thus far.  Patient denied needing assistance with completing applications for [place] Medicaid and [place] and Nutrition, indicating that his children have already agreed to assist with completion and submit directly to these agencies for processing.  CSW encouraged patient to provide any one of his three children with CSW's contact information and have them contact CSW directly if they have questions regarding the applications or need assistance with completion.  Patient voiced understanding and was agreeable to this plan, confirming that he has the correct contact information for CSW.  CSW will perform a case closure on patient, as all goals of treatment have been met from social work standpoint and no additional social work needs have been identified at this time.  CSW will notify [name], [facility] Utilization Management Department, of CSW's plans to close patient's case.  CSW will also fax an update to patient's Primary Care Physician, Dr. Erline Levine, with [facility], to ensure that they are aware of CSW's involvement with  patient's plan of care.  Patient has been encouraged to contact CSW directly, if additional social work needs arise in the near future.    Bryan [facility], BSW, MSW, LCSW  Licensed Education officer, environmental Health System  Mailing [place] [address] [place], [place] [address] Physical Address[address], [place], [place] [address] Toll Free Main # [phone] Fax # [phone] Cell # [phone]  Office # [phone] [email or web address]

## 2019-02-22 DIAGNOSIS — U071 COVID-19: Secondary | ICD-10-CM | POA: Diagnosis not present

## 2019-02-22 DIAGNOSIS — J439 Emphysema, unspecified: Secondary | ICD-10-CM | POA: Diagnosis not present

## 2019-02-22 DIAGNOSIS — E1142 Type 2 diabetes mellitus with diabetic polyneuropathy: Secondary | ICD-10-CM | POA: Diagnosis not present

## 2019-02-22 DIAGNOSIS — E785 Hyperlipidemia, unspecified: Secondary | ICD-10-CM | POA: Diagnosis not present

## 2019-02-22 DIAGNOSIS — J1289 Other viral pneumonia: Secondary | ICD-10-CM | POA: Diagnosis not present

## 2019-02-22 DIAGNOSIS — N400 Enlarged prostate without lower urinary tract symptoms: Secondary | ICD-10-CM | POA: Diagnosis not present

## 2019-02-22 DIAGNOSIS — J80 Acute respiratory distress syndrome: Secondary | ICD-10-CM | POA: Diagnosis not present

## 2019-02-22 DIAGNOSIS — E1122 Type 2 diabetes mellitus with diabetic chronic kidney disease: Secondary | ICD-10-CM | POA: Diagnosis not present

## 2019-02-22 DIAGNOSIS — Z9181 History of falling: Secondary | ICD-10-CM | POA: Diagnosis not present

## 2019-02-22 DIAGNOSIS — Z9981 Dependence on supplemental oxygen: Secondary | ICD-10-CM | POA: Diagnosis not present

## 2019-02-22 DIAGNOSIS — K219 Gastro-esophageal reflux disease without esophagitis: Secondary | ICD-10-CM | POA: Diagnosis not present

## 2019-02-22 DIAGNOSIS — J8410 Pulmonary fibrosis, unspecified: Secondary | ICD-10-CM | POA: Diagnosis not present

## 2019-02-22 DIAGNOSIS — Z7982 Long term (current) use of aspirin: Secondary | ICD-10-CM | POA: Diagnosis not present

## 2019-02-22 DIAGNOSIS — Z792 Long term (current) use of antibiotics: Secondary | ICD-10-CM | POA: Diagnosis not present

## 2019-02-22 DIAGNOSIS — I129 Hypertensive chronic kidney disease with stage 1 through stage 4 chronic kidney disease, or unspecified chronic kidney disease: Secondary | ICD-10-CM | POA: Diagnosis not present

## 2019-02-22 DIAGNOSIS — J470 Bronchiectasis with acute lower respiratory infection: Secondary | ICD-10-CM | POA: Diagnosis not present

## 2019-02-22 DIAGNOSIS — I214 Non-ST elevation (NSTEMI) myocardial infarction: Secondary | ICD-10-CM | POA: Diagnosis not present

## 2019-02-22 DIAGNOSIS — N183 Chronic kidney disease, stage 3 unspecified: Secondary | ICD-10-CM | POA: Diagnosis not present

## 2019-02-22 DIAGNOSIS — Z7952 Long term (current) use of systemic steroids: Secondary | ICD-10-CM | POA: Diagnosis not present

## 2019-02-22 DIAGNOSIS — I959 Hypotension, unspecified: Secondary | ICD-10-CM | POA: Diagnosis not present

## 2019-02-22 DIAGNOSIS — Z794 Long term (current) use of insulin: Secondary | ICD-10-CM | POA: Diagnosis not present

## 2019-02-28 DIAGNOSIS — Z1211 Encounter for screening for malignant neoplasm of colon: Secondary | ICD-10-CM | POA: Diagnosis not present

## 2019-03-09 DIAGNOSIS — U071 COVID-19: Secondary | ICD-10-CM | POA: Diagnosis not present

## 2019-03-13 DIAGNOSIS — R0902 Hypoxemia: Secondary | ICD-10-CM | POA: Diagnosis not present

## 2019-03-13 DIAGNOSIS — Z6824 Body mass index (BMI) 24.0-24.9, adult: Secondary | ICD-10-CM | POA: Diagnosis not present

## 2019-03-16 DIAGNOSIS — J479 Bronchiectasis, uncomplicated: Secondary | ICD-10-CM | POA: Diagnosis not present

## 2019-04-09 DIAGNOSIS — U071 COVID-19: Secondary | ICD-10-CM | POA: Diagnosis not present

## 2019-04-15 DIAGNOSIS — I10 Essential (primary) hypertension: Secondary | ICD-10-CM | POA: Diagnosis not present

## 2019-04-15 DIAGNOSIS — E119 Type 2 diabetes mellitus without complications: Secondary | ICD-10-CM | POA: Diagnosis not present

## 2019-04-15 DIAGNOSIS — Z6825 Body mass index (BMI) 25.0-25.9, adult: Secondary | ICD-10-CM | POA: Diagnosis not present

## 2019-04-15 DIAGNOSIS — L723 Sebaceous cyst: Secondary | ICD-10-CM | POA: Diagnosis not present

## 2019-04-16 DIAGNOSIS — J479 Bronchiectasis, uncomplicated: Secondary | ICD-10-CM | POA: Diagnosis not present

## 2019-04-30 DIAGNOSIS — L0291 Cutaneous abscess, unspecified: Secondary | ICD-10-CM | POA: Diagnosis not present

## 2019-04-30 DIAGNOSIS — L723 Sebaceous cyst: Secondary | ICD-10-CM | POA: Diagnosis not present

## 2019-05-03 ENCOUNTER — Ambulatory Visit: Payer: PPO | Attending: Internal Medicine

## 2019-05-03 DIAGNOSIS — Z23 Encounter for immunization: Secondary | ICD-10-CM

## 2019-05-03 NOTE — Progress Notes (Signed)
   Covid-19 Vaccination Clinic  Name:  Bryan Oconnor    MRN: [id number] DOB: Jul 13, 1950  05/03/2019  Bryan Oconnor was observed post Covid-19 immunization for 15 minutes without incidence. He was provided with Vaccine Information Sheet and instruction to access the V-Safe system.   Bryan Oconnor was instructed to call 911 with any severe reactions post vaccine: Marland Kitchen Difficulty breathing  . Swelling of your face and throat  . A fast heartbeat  . A bad rash all over your body  . Dizziness and weakness    Immunizations Administered    Name Date Dose VIS Date Route   Pfizer COVID-19 Vaccine 05/03/2019  9:41 AM 0.3 mL 02/15/2019 Intramuscular   Manufacturer: [employer], [employer]   Lot: [id number]   NDC: [address]-5

## 2019-05-07 DIAGNOSIS — U071 COVID-19: Secondary | ICD-10-CM | POA: Diagnosis not present

## 2019-05-14 DIAGNOSIS — J479 Bronchiectasis, uncomplicated: Secondary | ICD-10-CM | POA: Diagnosis not present

## 2019-05-14 DIAGNOSIS — L723 Sebaceous cyst: Secondary | ICD-10-CM | POA: Diagnosis not present

## 2019-05-28 ENCOUNTER — Ambulatory Visit: Payer: PPO | Attending: Internal Medicine

## 2019-05-28 DIAGNOSIS — Z23 Encounter for immunization: Secondary | ICD-10-CM

## 2019-05-28 NOTE — Progress Notes (Signed)
   Covid-19 Vaccination Clinic  Name:  Bryan Oconnor    MRN: [id number] DOB: May 07, 1950  05/28/2019  Bryan Oconnor was observed post Covid-19 immunization for 15 minutes without incident. He was provided with Vaccine Information Sheet and instruction to access the V-Safe system.   Bryan O[facility]nor was instructed to call 911 with any severe reactions post vaccine: Marland Kitchen Difficulty breathing  . Swelling of face and throat  . A fast heartbeat  . A bad rash all over body  . Dizziness and weakness   Immunizations Administered    Name Date Dose VIS Date Route   Pfizer COVID-19 Vaccine 05/28/2019  2:10 PM 0.3 mL 02/15/2019 Intramuscular   Manufacturer: [employer], [employer]   Lot: [id number]   NDC: [address]-9

## 2019-06-07 DIAGNOSIS — U071 COVID-19: Secondary | ICD-10-CM | POA: Diagnosis not present

## 2019-06-14 DIAGNOSIS — J479 Bronchiectasis, uncomplicated: Secondary | ICD-10-CM | POA: Diagnosis not present

## 2019-07-07 DIAGNOSIS — U071 COVID-19: Secondary | ICD-10-CM | POA: Diagnosis not present

## 2019-07-14 DIAGNOSIS — J479 Bronchiectasis, uncomplicated: Secondary | ICD-10-CM | POA: Diagnosis not present

## 2019-07-15 DIAGNOSIS — I10 Essential (primary) hypertension: Secondary | ICD-10-CM | POA: Diagnosis not present

## 2019-07-15 DIAGNOSIS — E119 Type 2 diabetes mellitus without complications: Secondary | ICD-10-CM | POA: Diagnosis not present

## 2019-07-15 DIAGNOSIS — Z6826 Body mass index (BMI) 26.0-26.9, adult: Secondary | ICD-10-CM | POA: Diagnosis not present

## 2019-07-15 DIAGNOSIS — R799 Abnormal finding of blood chemistry, unspecified: Secondary | ICD-10-CM | POA: Diagnosis not present

## 2019-07-15 DIAGNOSIS — J8410 Pulmonary fibrosis, unspecified: Secondary | ICD-10-CM | POA: Diagnosis not present

## 2019-07-15 DIAGNOSIS — N179 Acute kidney failure, unspecified: Secondary | ICD-10-CM | POA: Diagnosis not present

## 2019-07-15 DIAGNOSIS — E1122 Type 2 diabetes mellitus with diabetic chronic kidney disease: Secondary | ICD-10-CM | POA: Diagnosis not present

## 2019-07-15 DIAGNOSIS — M545 Low back pain: Secondary | ICD-10-CM | POA: Diagnosis not present

## 2019-07-15 DIAGNOSIS — E785 Hyperlipidemia, unspecified: Secondary | ICD-10-CM | POA: Diagnosis not present

## 2019-07-15 DIAGNOSIS — G8929 Other chronic pain: Secondary | ICD-10-CM | POA: Diagnosis not present

## 2019-07-15 DIAGNOSIS — N183 Chronic kidney disease, stage 3 unspecified: Secondary | ICD-10-CM | POA: Diagnosis not present

## 2019-07-15 DIAGNOSIS — G629 Polyneuropathy, unspecified: Secondary | ICD-10-CM | POA: Diagnosis not present

## 2019-07-15 DIAGNOSIS — R300 Dysuria: Secondary | ICD-10-CM | POA: Diagnosis not present

## 2019-08-07 DIAGNOSIS — U071 COVID-19: Secondary | ICD-10-CM | POA: Diagnosis not present

## 2019-08-14 DIAGNOSIS — J479 Bronchiectasis, uncomplicated: Secondary | ICD-10-CM | POA: Diagnosis not present

## 2019-08-22 DIAGNOSIS — E113393 Type 2 diabetes mellitus with moderate nonproliferative diabetic retinopathy without macular edema, bilateral: Secondary | ICD-10-CM | POA: Diagnosis not present

## 2019-09-06 DIAGNOSIS — U071 COVID-19: Secondary | ICD-10-CM | POA: Diagnosis not present

## 2019-09-13 DIAGNOSIS — J479 Bronchiectasis, uncomplicated: Secondary | ICD-10-CM | POA: Diagnosis not present

## 2019-10-07 DIAGNOSIS — U071 COVID-19: Secondary | ICD-10-CM | POA: Diagnosis not present

## 2019-10-14 DIAGNOSIS — J479 Bronchiectasis, uncomplicated: Secondary | ICD-10-CM | POA: Diagnosis not present

## 2019-10-16 DIAGNOSIS — R809 Proteinuria, unspecified: Secondary | ICD-10-CM | POA: Diagnosis not present

## 2019-10-16 DIAGNOSIS — E1129 Type 2 diabetes mellitus with other diabetic kidney complication: Secondary | ICD-10-CM | POA: Diagnosis not present

## 2019-10-16 DIAGNOSIS — E1122 Type 2 diabetes mellitus with diabetic chronic kidney disease: Secondary | ICD-10-CM | POA: Diagnosis not present

## 2019-10-16 DIAGNOSIS — M545 Low back pain: Secondary | ICD-10-CM | POA: Diagnosis not present

## 2019-10-16 DIAGNOSIS — Z6826 Body mass index (BMI) 26.0-26.9, adult: Secondary | ICD-10-CM | POA: Diagnosis not present

## 2019-10-16 DIAGNOSIS — N183 Chronic kidney disease, stage 3 unspecified: Secondary | ICD-10-CM | POA: Diagnosis not present

## 2019-10-16 DIAGNOSIS — G8929 Other chronic pain: Secondary | ICD-10-CM | POA: Diagnosis not present

## 2019-10-16 DIAGNOSIS — Z23 Encounter for immunization: Secondary | ICD-10-CM | POA: Diagnosis not present

## 2019-11-07 DIAGNOSIS — U071 COVID-19: Secondary | ICD-10-CM | POA: Diagnosis not present

## 2019-11-14 DIAGNOSIS — J479 Bronchiectasis, uncomplicated: Secondary | ICD-10-CM | POA: Diagnosis not present

## 2019-11-18 DIAGNOSIS — N183 Chronic kidney disease, stage 3 unspecified: Secondary | ICD-10-CM | POA: Diagnosis not present

## 2019-11-18 DIAGNOSIS — R809 Proteinuria, unspecified: Secondary | ICD-10-CM | POA: Diagnosis not present

## 2019-11-18 DIAGNOSIS — N400 Enlarged prostate without lower urinary tract symptoms: Secondary | ICD-10-CM | POA: Diagnosis not present

## 2019-11-18 DIAGNOSIS — I10 Essential (primary) hypertension: Secondary | ICD-10-CM | POA: Diagnosis not present

## 2019-11-18 DIAGNOSIS — Z6826 Body mass index (BMI) 26.0-26.9, adult: Secondary | ICD-10-CM | POA: Diagnosis not present

## 2019-11-18 DIAGNOSIS — E875 Hyperkalemia: Secondary | ICD-10-CM | POA: Diagnosis not present

## 2019-11-18 DIAGNOSIS — E1122 Type 2 diabetes mellitus with diabetic chronic kidney disease: Secondary | ICD-10-CM | POA: Diagnosis not present

## 2019-11-19 DIAGNOSIS — Z23 Encounter for immunization: Secondary | ICD-10-CM | POA: Diagnosis not present

## 2019-12-02 DIAGNOSIS — N400 Enlarged prostate without lower urinary tract symptoms: Secondary | ICD-10-CM | POA: Diagnosis not present

## 2019-12-02 DIAGNOSIS — E1122 Type 2 diabetes mellitus with diabetic chronic kidney disease: Secondary | ICD-10-CM | POA: Diagnosis not present

## 2019-12-02 DIAGNOSIS — N183 Chronic kidney disease, stage 3 unspecified: Secondary | ICD-10-CM | POA: Diagnosis not present

## 2019-12-02 DIAGNOSIS — N289 Disorder of kidney and ureter, unspecified: Secondary | ICD-10-CM | POA: Diagnosis not present

## 2019-12-07 DIAGNOSIS — U071 COVID-19: Secondary | ICD-10-CM | POA: Diagnosis not present

## 2019-12-14 DIAGNOSIS — J479 Bronchiectasis, uncomplicated: Secondary | ICD-10-CM | POA: Diagnosis not present

## 2020-01-01 DIAGNOSIS — Z6827 Body mass index (BMI) 27.0-27.9, adult: Secondary | ICD-10-CM | POA: Diagnosis not present

## 2020-01-01 DIAGNOSIS — R809 Proteinuria, unspecified: Secondary | ICD-10-CM | POA: Diagnosis not present

## 2020-01-01 DIAGNOSIS — E1129 Type 2 diabetes mellitus with other diabetic kidney complication: Secondary | ICD-10-CM | POA: Diagnosis not present

## 2020-01-01 DIAGNOSIS — I10 Essential (primary) hypertension: Secondary | ICD-10-CM | POA: Diagnosis not present

## 2020-01-07 DIAGNOSIS — U071 COVID-19: Secondary | ICD-10-CM | POA: Diagnosis not present

## 2020-01-14 DIAGNOSIS — J479 Bronchiectasis, uncomplicated: Secondary | ICD-10-CM | POA: Diagnosis not present

## 2020-01-20 DIAGNOSIS — E1122 Type 2 diabetes mellitus with diabetic chronic kidney disease: Secondary | ICD-10-CM | POA: Diagnosis not present

## 2020-01-20 DIAGNOSIS — N183 Chronic kidney disease, stage 3 unspecified: Secondary | ICD-10-CM | POA: Diagnosis not present

## 2020-01-20 DIAGNOSIS — Z6827 Body mass index (BMI) 27.0-27.9, adult: Secondary | ICD-10-CM | POA: Diagnosis not present

## 2020-01-20 DIAGNOSIS — R809 Proteinuria, unspecified: Secondary | ICD-10-CM | POA: Diagnosis not present

## 2020-01-20 DIAGNOSIS — E1129 Type 2 diabetes mellitus with other diabetic kidney complication: Secondary | ICD-10-CM | POA: Diagnosis not present

## 2020-01-20 DIAGNOSIS — E1142 Type 2 diabetes mellitus with diabetic polyneuropathy: Secondary | ICD-10-CM | POA: Diagnosis not present

## 2020-01-20 DIAGNOSIS — I10 Essential (primary) hypertension: Secondary | ICD-10-CM | POA: Diagnosis not present

## 2020-01-20 DIAGNOSIS — M898X1 Other specified disorders of bone, shoulder: Secondary | ICD-10-CM | POA: Diagnosis not present

## 2020-02-13 DIAGNOSIS — J479 Bronchiectasis, uncomplicated: Secondary | ICD-10-CM | POA: Diagnosis not present

## 2020-02-20 DIAGNOSIS — I10 Essential (primary) hypertension: Secondary | ICD-10-CM | POA: Diagnosis not present

## 2020-02-20 DIAGNOSIS — E1122 Type 2 diabetes mellitus with diabetic chronic kidney disease: Secondary | ICD-10-CM | POA: Diagnosis not present

## 2020-02-20 DIAGNOSIS — N183 Chronic kidney disease, stage 3 unspecified: Secondary | ICD-10-CM | POA: Diagnosis not present

## 2020-02-25 DIAGNOSIS — N400 Enlarged prostate without lower urinary tract symptoms: Secondary | ICD-10-CM | POA: Diagnosis not present

## 2020-02-25 DIAGNOSIS — R809 Proteinuria, unspecified: Secondary | ICD-10-CM | POA: Diagnosis not present

## 2020-02-25 DIAGNOSIS — N183 Chronic kidney disease, stage 3 unspecified: Secondary | ICD-10-CM | POA: Diagnosis not present

## 2020-02-25 DIAGNOSIS — Z6827 Body mass index (BMI) 27.0-27.9, adult: Secondary | ICD-10-CM | POA: Diagnosis not present

## 2020-02-25 DIAGNOSIS — I10 Essential (primary) hypertension: Secondary | ICD-10-CM | POA: Diagnosis not present

## 2020-02-25 DIAGNOSIS — E1122 Type 2 diabetes mellitus with diabetic chronic kidney disease: Secondary | ICD-10-CM | POA: Diagnosis not present

## 2020-03-17 ENCOUNTER — Telehealth: Payer: Self-pay | Admitting: Internal Medicine

## 2020-03-17 NOTE — Telephone Encounter (Signed)
03/17/20  Contacted the patient.  Reviewed with patient that he is not currently established in our [facility] or in our [place] location.  He was seen by [facility] clinic pulmonary.  I would recommend that he contact [facility] pulmonary clinic Dr. Karna Christmas for future follow-up.  If the patient becomes interested in becoming a pulmonary patient he is aware that he can contact our [place] office as a self-referral and he would need a 30-minute consult slot.  Nothing further needed at this time.  Elisha Headland FNP

## 2020-03-17 NOTE — Telephone Encounter (Signed)
disregard

## 2020-03-19 DIAGNOSIS — E113393 Type 2 diabetes mellitus with moderate nonproliferative diabetic retinopathy without macular edema, bilateral: Secondary | ICD-10-CM | POA: Diagnosis not present

## 2020-04-29 DIAGNOSIS — E1122 Type 2 diabetes mellitus with diabetic chronic kidney disease: Secondary | ICD-10-CM | POA: Diagnosis not present

## 2020-04-29 DIAGNOSIS — Z6828 Body mass index (BMI) 28.0-28.9, adult: Secondary | ICD-10-CM | POA: Diagnosis not present

## 2020-04-29 DIAGNOSIS — E1129 Type 2 diabetes mellitus with other diabetic kidney complication: Secondary | ICD-10-CM | POA: Diagnosis not present

## 2020-04-29 DIAGNOSIS — N183 Chronic kidney disease, stage 3 unspecified: Secondary | ICD-10-CM | POA: Diagnosis not present

## 2020-04-29 DIAGNOSIS — E119 Type 2 diabetes mellitus without complications: Secondary | ICD-10-CM | POA: Diagnosis not present

## 2020-04-29 DIAGNOSIS — N1832 Chronic kidney disease, stage 3b: Secondary | ICD-10-CM | POA: Diagnosis not present

## 2020-04-29 DIAGNOSIS — R809 Proteinuria, unspecified: Secondary | ICD-10-CM | POA: Diagnosis not present

## 2020-05-01 DIAGNOSIS — I10 Essential (primary) hypertension: Secondary | ICD-10-CM | POA: Diagnosis not present

## 2020-05-01 DIAGNOSIS — Z125 Encounter for screening for malignant neoplasm of prostate: Secondary | ICD-10-CM | POA: Diagnosis not present

## 2020-05-01 DIAGNOSIS — Z794 Long term (current) use of insulin: Secondary | ICD-10-CM | POA: Diagnosis not present

## 2020-05-01 DIAGNOSIS — N401 Enlarged prostate with lower urinary tract symptoms: Secondary | ICD-10-CM | POA: Diagnosis not present

## 2020-05-01 DIAGNOSIS — R351 Nocturia: Secondary | ICD-10-CM | POA: Diagnosis not present

## 2020-05-01 DIAGNOSIS — E1165 Type 2 diabetes mellitus with hyperglycemia: Secondary | ICD-10-CM | POA: Diagnosis not present

## 2020-05-01 DIAGNOSIS — Z91030 Bee allergy status: Secondary | ICD-10-CM | POA: Diagnosis not present

## 2020-05-01 DIAGNOSIS — Z87891 Personal history of nicotine dependence: Secondary | ICD-10-CM | POA: Diagnosis not present

## 2020-05-01 DIAGNOSIS — Z7982 Long term (current) use of aspirin: Secondary | ICD-10-CM | POA: Diagnosis not present

## 2020-05-01 DIAGNOSIS — N400 Enlarged prostate without lower urinary tract symptoms: Secondary | ICD-10-CM | POA: Diagnosis not present

## 2020-05-01 DIAGNOSIS — E785 Hyperlipidemia, unspecified: Secondary | ICD-10-CM | POA: Diagnosis not present

## 2020-05-01 DIAGNOSIS — Z833 Family history of diabetes mellitus: Secondary | ICD-10-CM | POA: Diagnosis not present

## 2020-05-01 DIAGNOSIS — K219 Gastro-esophageal reflux disease without esophagitis: Secondary | ICD-10-CM | POA: Diagnosis not present

## 2020-05-01 DIAGNOSIS — Z7984 Long term (current) use of oral hypoglycemic drugs: Secondary | ICD-10-CM | POA: Diagnosis not present

## 2020-05-01 DIAGNOSIS — Z79899 Other long term (current) drug therapy: Secondary | ICD-10-CM | POA: Diagnosis not present

## 2020-05-13 DIAGNOSIS — R413 Other amnesia: Secondary | ICD-10-CM | POA: Diagnosis not present

## 2020-05-13 DIAGNOSIS — Z6827 Body mass index (BMI) 27.0-27.9, adult: Secondary | ICD-10-CM | POA: Diagnosis not present

## 2020-05-13 DIAGNOSIS — E1122 Type 2 diabetes mellitus with diabetic chronic kidney disease: Secondary | ICD-10-CM | POA: Diagnosis not present

## 2020-05-13 DIAGNOSIS — E1142 Type 2 diabetes mellitus with diabetic polyneuropathy: Secondary | ICD-10-CM | POA: Diagnosis not present

## 2020-05-13 DIAGNOSIS — N1832 Chronic kidney disease, stage 3b: Secondary | ICD-10-CM | POA: Diagnosis not present

## 2020-06-26 DIAGNOSIS — E1122 Type 2 diabetes mellitus with diabetic chronic kidney disease: Secondary | ICD-10-CM | POA: Diagnosis not present

## 2020-06-26 DIAGNOSIS — Z6826 Body mass index (BMI) 26.0-26.9, adult: Secondary | ICD-10-CM | POA: Diagnosis not present

## 2020-06-26 DIAGNOSIS — N1832 Chronic kidney disease, stage 3b: Secondary | ICD-10-CM | POA: Diagnosis not present

## 2020-06-26 DIAGNOSIS — I10 Essential (primary) hypertension: Secondary | ICD-10-CM | POA: Diagnosis not present

## 2020-11-02 ENCOUNTER — Other Ambulatory Visit: Payer: Self-pay

## 2020-11-02 ENCOUNTER — Ambulatory Visit (INDEPENDENT_AMBULATORY_CARE_PROVIDER_SITE_OTHER): Payer: PPO | Admitting: Primary Care

## 2020-11-02 ENCOUNTER — Encounter (INDEPENDENT_AMBULATORY_CARE_PROVIDER_SITE_OTHER): Payer: Self-pay

## 2021-02-22 DIAGNOSIS — Z6826 Body mass index (BMI) 26.0-26.9, adult: Secondary | ICD-10-CM | POA: Diagnosis not present

## 2021-02-22 DIAGNOSIS — M65332 Trigger finger, left middle finger: Secondary | ICD-10-CM | POA: Diagnosis not present

## 2021-02-22 DIAGNOSIS — E1122 Type 2 diabetes mellitus with diabetic chronic kidney disease: Secondary | ICD-10-CM | POA: Diagnosis not present

## 2021-02-22 DIAGNOSIS — N1832 Chronic kidney disease, stage 3b: Secondary | ICD-10-CM | POA: Diagnosis not present

## 2021-02-22 DIAGNOSIS — R351 Nocturia: Secondary | ICD-10-CM | POA: Diagnosis not present

## 2021-02-22 DIAGNOSIS — Z23 Encounter for immunization: Secondary | ICD-10-CM | POA: Diagnosis not present

## 2021-02-22 DIAGNOSIS — N401 Enlarged prostate with lower urinary tract symptoms: Secondary | ICD-10-CM | POA: Diagnosis not present

## 2021-03-09 IMAGING — DX DG CHEST 1V PORT
1 series · 1 of 1 positions shown · non-contrast
Comparison: None.

CLINICAL DATA: Fever.  [id number] positive.

EXAM:
PORTABLE CHEST 1 VIEW

[chest ap]
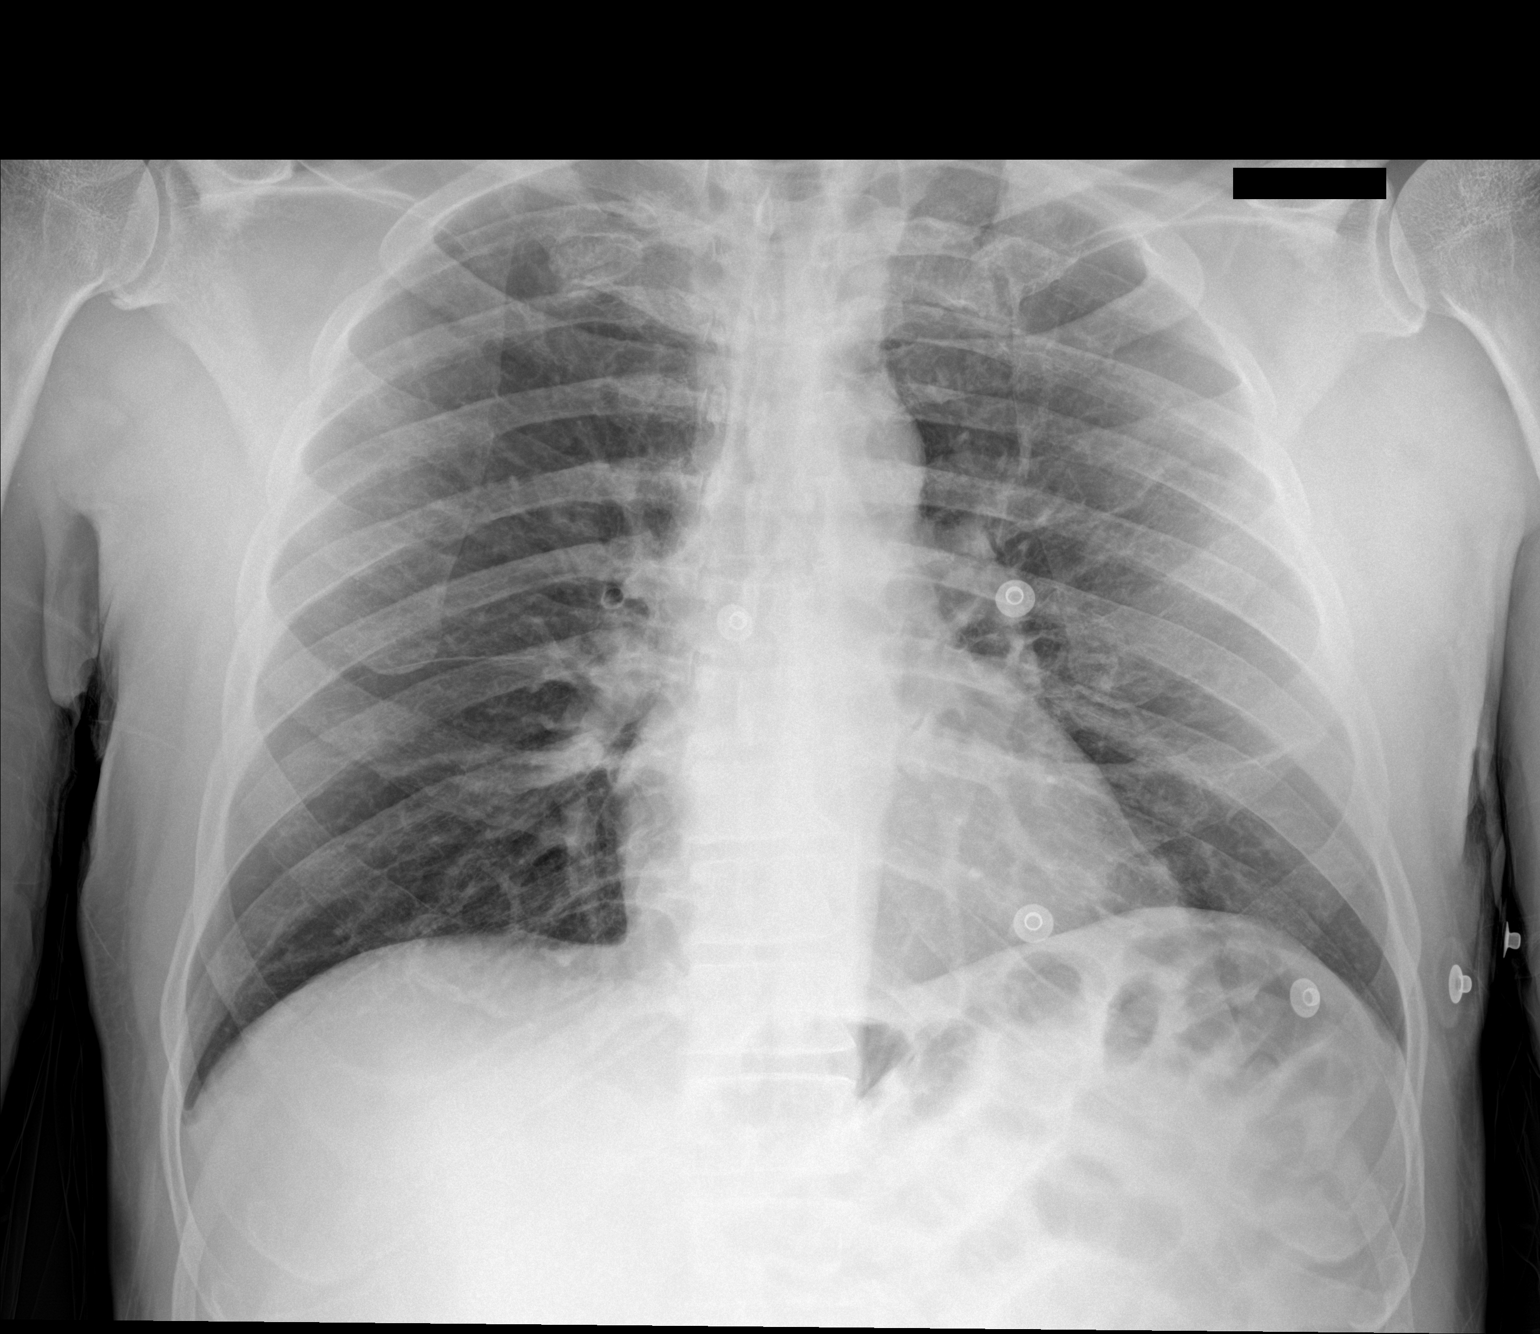

[1 of 1 positions shown; findings below may reference images not displayed]

FINDINGS: Cardiomediastinal silhouette is normal. Mediastinal contours appear
intact.

There is no evidence of focal airspace consolidation, pleural
effusion or pneumothorax.

Osseous structures are without acute abnormality. Soft tissues are
grossly normal.
IMPRESSION: No active disease.

## 2021-03-10 IMAGING — DX DG CHEST 1V
1 series · 1 of 1 positions shown · non-contrast
Comparison: December 16, 2018.

CLINICAL DATA: Fever.

EXAM:
CHEST  1 VIEW

[chest]
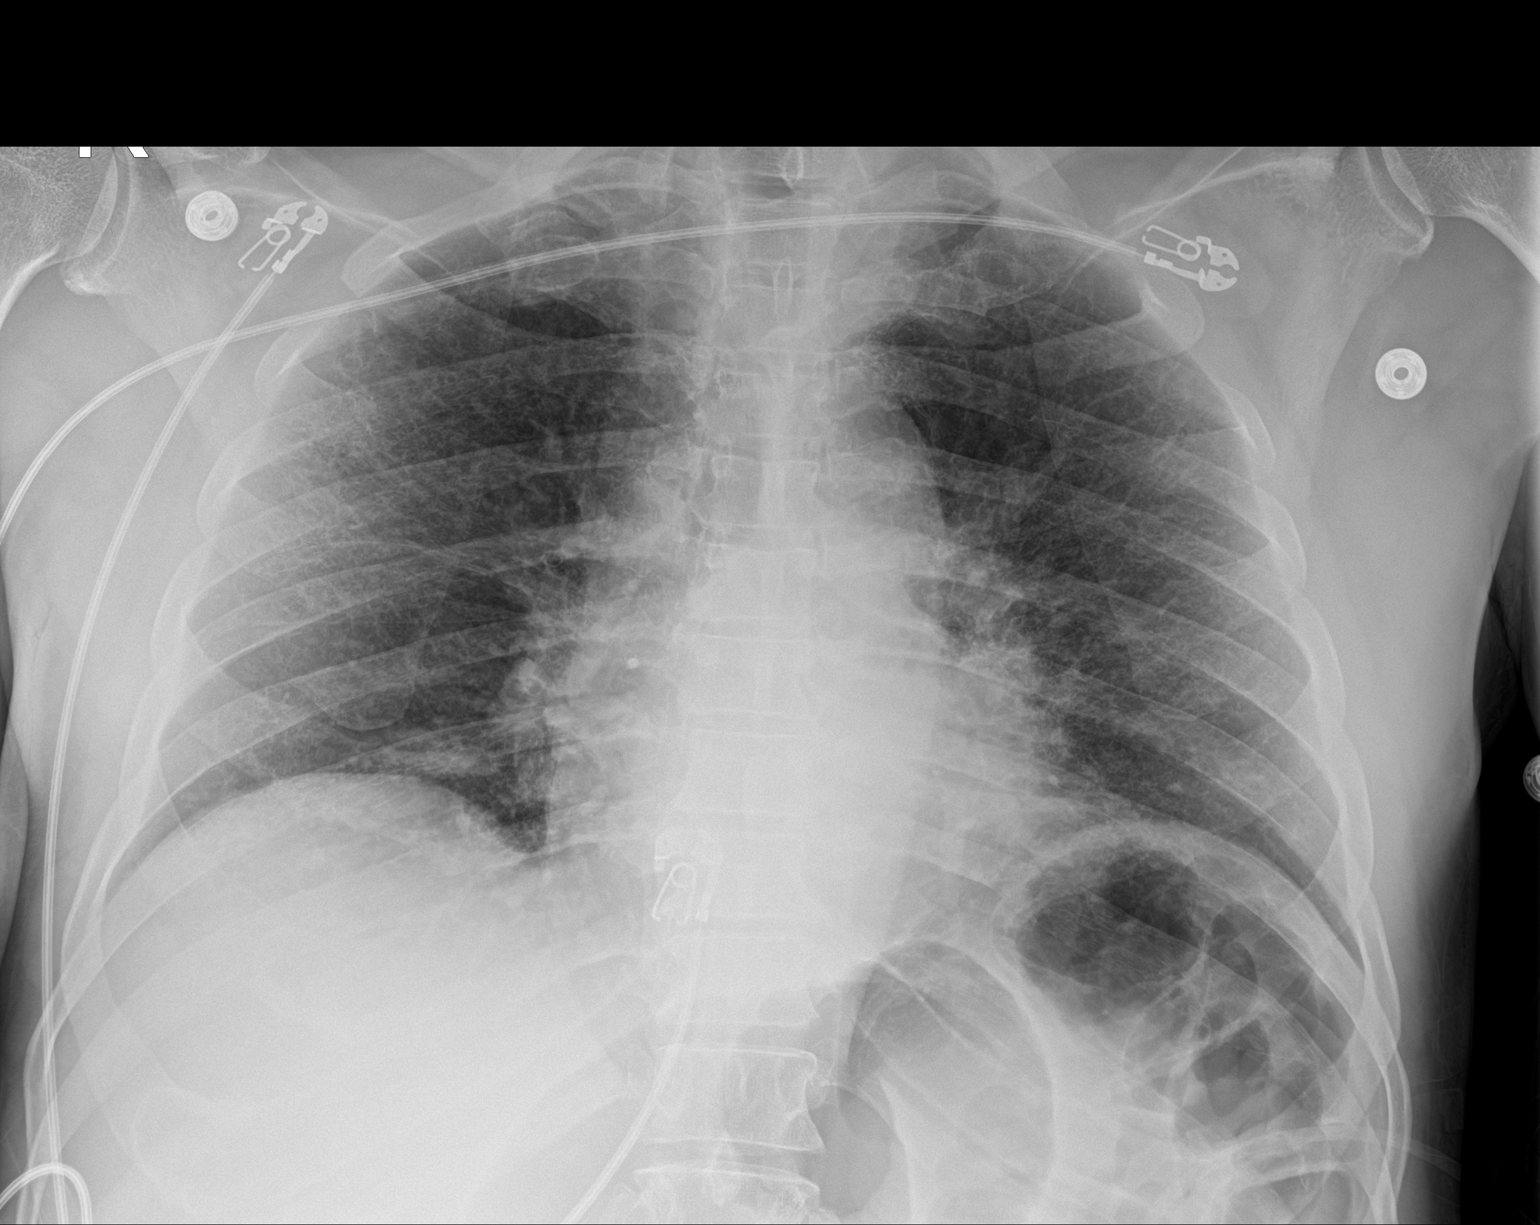

[1 of 1 positions shown; findings below may reference images not displayed]

FINDINGS: The heart size and mediastinal contours are within normal limits.
Both lungs are clear. The visualized skeletal structures are
unremarkable.
IMPRESSION: No active disease.

## 2021-04-17 IMAGING — CT CT ANGIO CHEST
2 of 6 series · 17 of 46 positions shown · IV contrast (omnipaque)
Comparison: Chest radiograph January 24, 2019 and chest CT [facility]

CLINICAL DATA: Shortness of breath.  Recent [id number]-FR pneumonia

EXAM:
CT ANGIOGRAPHY CHEST WITH CONTRAST
TECHNIQUE: Multidetector CT imaging of the chest was performed using the
standard protocol during bolus administration of intravenous
contrast. Multiplanar CT image reconstructions and MIPs were
obtained to evaluate the vascular anatomy.
CONTRAST:  75mL OMNIPAQUE IOHEXOL 350 MG/ML SOLN

[Series 5: thins · axial · 0.66mm/px · z∈[-338,-82]mm · 14 of 282 slices shown]
[im 13/282  lung]
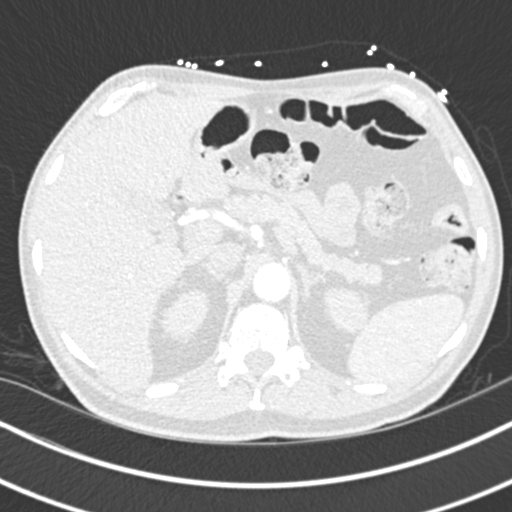
[im 37/282  soft-tissue]
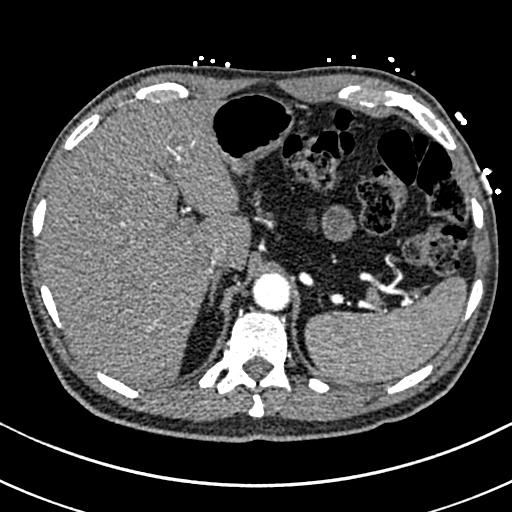
[im 49/282  lung]
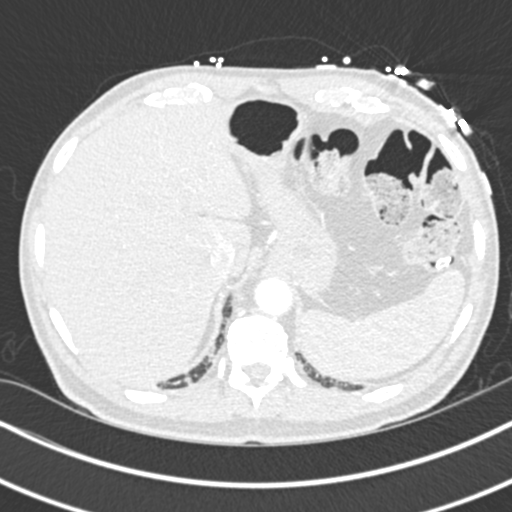
[im 74/282  soft-tissue]
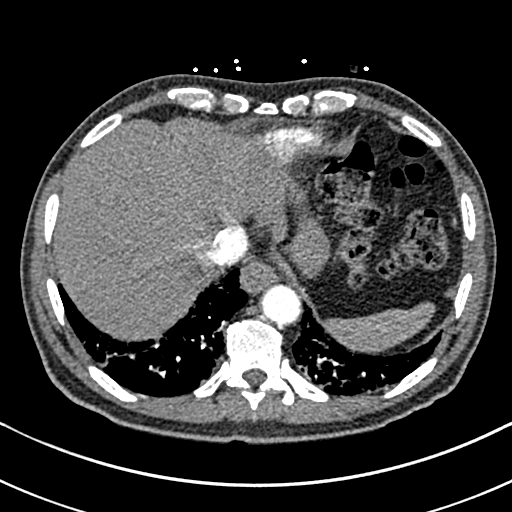
[im 98/282  lung]
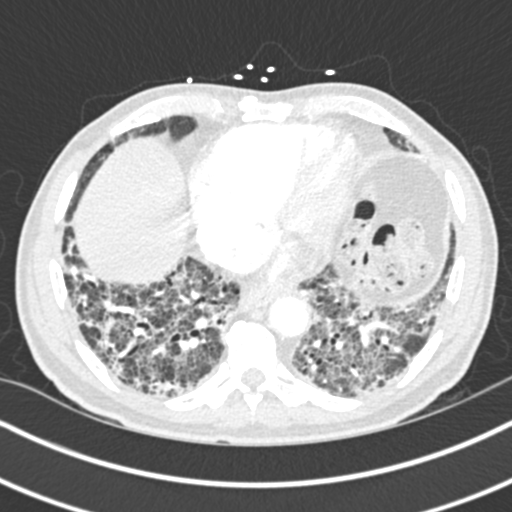
[im 110/282  soft-tissue]
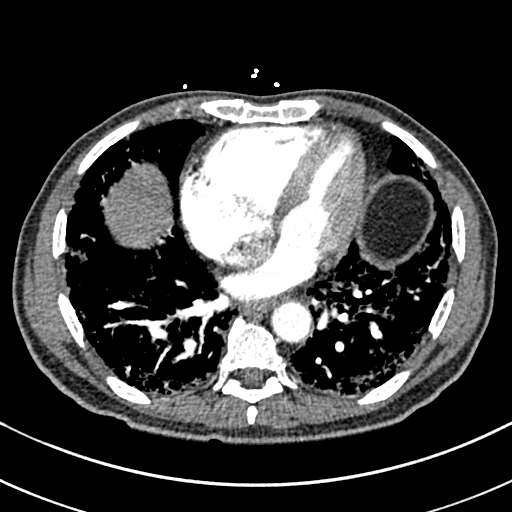
[im 135/282  lung]
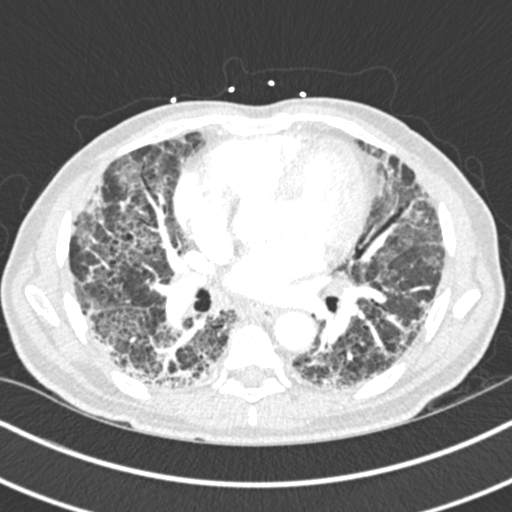
[im 147/282  soft-tissue]
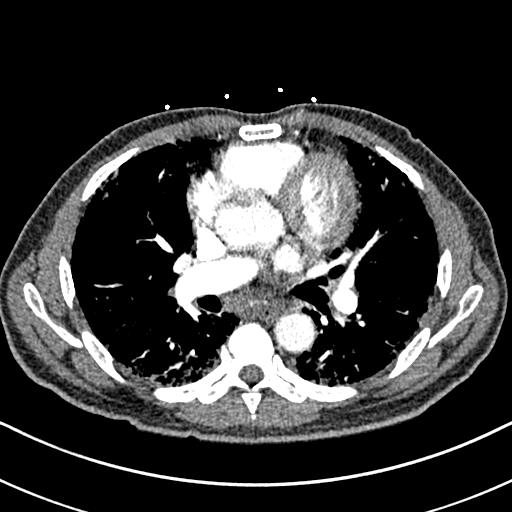
[im 172/282  lung]
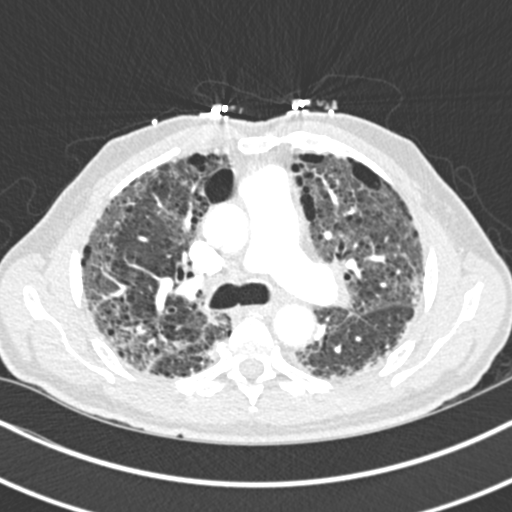
[im 184/282  soft-tissue]
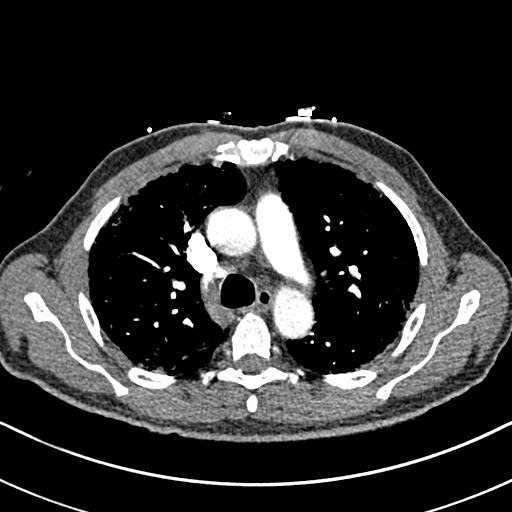
[im 208/282  lung]
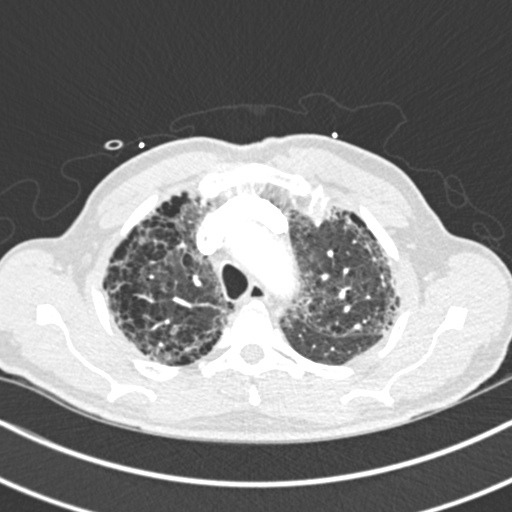
[im 233/282  soft-tissue]
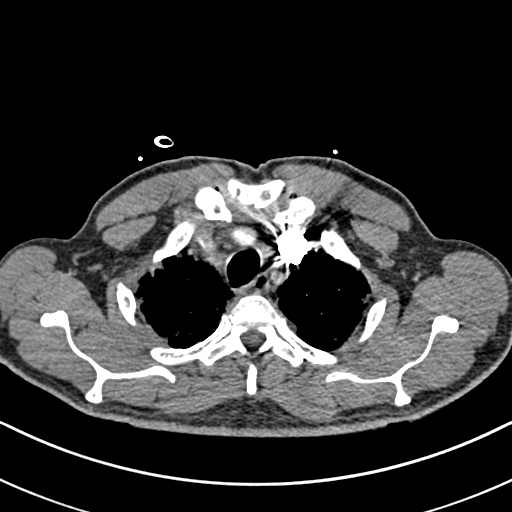
[im 245/282  lung]
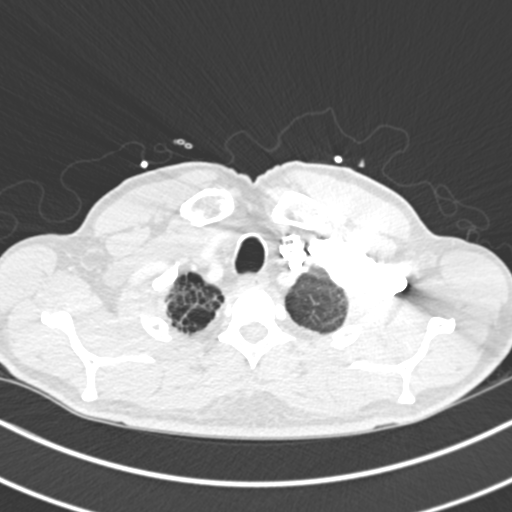
[im 269/282  soft-tissue]
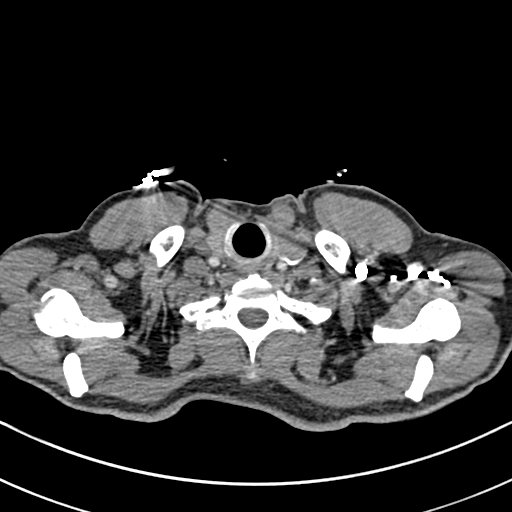

[Series 7: coronal mpr · coronal · 0.55mm/px · 3 of 87 slices shown]
[im 22/87  soft-tissue]
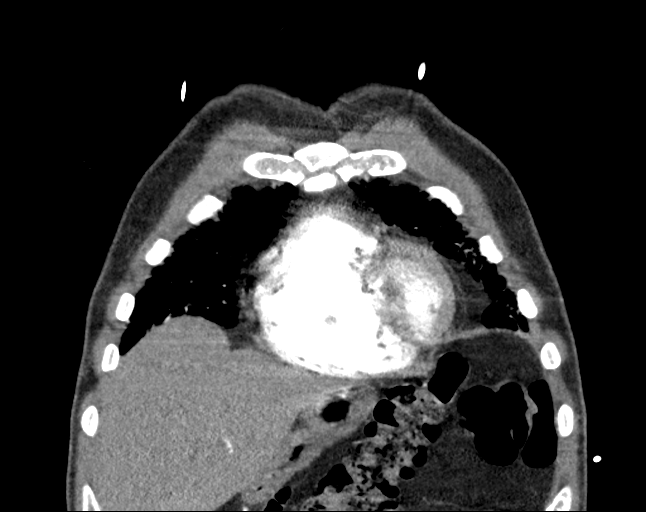
[im 44/87  soft-tissue]
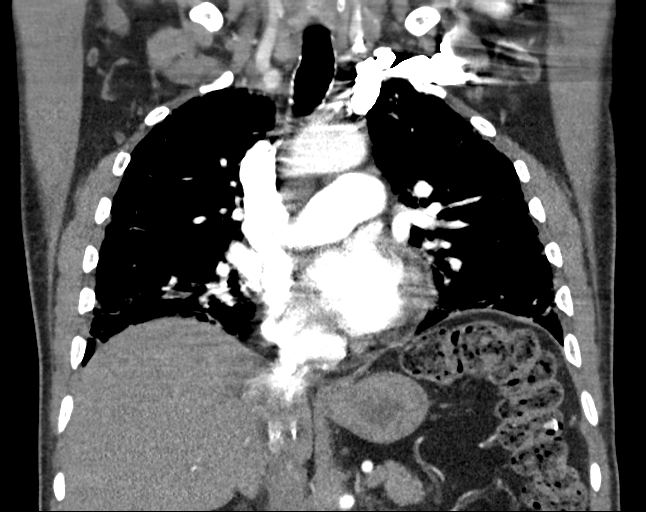
[im 65/87  soft-tissue]
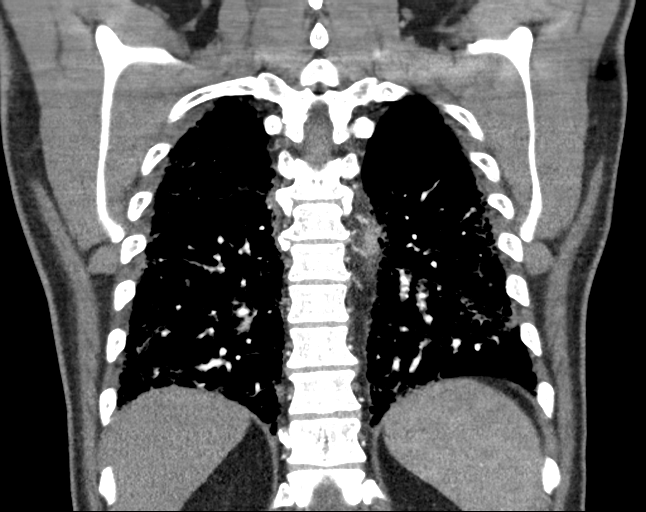

[17 of 46 positions shown; findings below may reference images not displayed]

FINDINGS: Cardiovascular: There is no demonstrable pulmonary embolus. There is
no thoracic aortic aneurysm or dissection. Visualized great vessels
appear unremarkable. Note that the right innominate and left common
carotid arteries arise as a common trunk, an anatomic variant. No
evident pericardial effusion or pericardial thickening. Main
pulmonary artery measures 3.1 cm, borderline for pulmonary arterial
hypertension.

Mediastinum/Nodes: Thyroid appears unremarkable. There is a
subcarinal lymph node measuring 1.7 x 1.3 cm, slightly increased in
size compared to the previous study. There are scattered
subcentimeter mediastinal lymph nodes elsewhere. There is a small
hiatal hernia.

Lungs/Pleura: There is underlying emphysematous change. There is
extensive fibrosis throughout the lungs bilaterally. There are areas
of associated atelectatic change. There is no frank airspace
consolidation evident currently. There are no pleural effusions.
There is a degree of lower lobe traction bronchiectasis.

Upper Abdomen: There is mild reflux of contrast into the inferior
vena cava and hepatic veins. Visualized upper abdominal structures
otherwise appear unremarkable.

Musculoskeletal: No blastic or lytic bone lesions. No chest wall
lesions are evident.

Review of the MIP images confirms the above findings.
IMPRESSION: 1. No demonstrable pulmonary embolus. No thoracic aortic aneurysm or
dissection.

2. Underlying emphysematous change with extensive fibrosis
throughout the lungs bilaterally. Areas of atelectatic change noted.
No well-defined airspace consolidation. A degree of residual viral
pneumonitis is possible intermingled with this extensive fibrosis.
There is also lower lobe bronchiectatic change bilaterally.

3. Prominent subcarinal lymph node which potentially may be due to
the parenchymal lung changes. No other frank adenopathy.

4. Borderline prominent main pulmonary outflow tract, a finding that
may be indicative of a degree of underlying pulmonary arterial
hypertension.

5. Mild reflux of contrast into the inferior vena cava and hepatic
veins, a finding that may be indicative of a degree of increased
right heart pressure.

6.  Small hiatal hernia.

Emphysema ([id number]).

## 2021-11-11 ENCOUNTER — Other Ambulatory Visit: Payer: Self-pay

## 2021-11-11 ENCOUNTER — Emergency Department: Payer: PPO

## 2021-11-11 ENCOUNTER — Emergency Department
Admission: EM | Admit: 2021-11-11 | Discharge: 2021-11-11 | Disposition: A | Discharge status: Home / Self Care | Payer: PPO | Attending: Emergency Medicine | Admitting: Emergency Medicine

## 2021-11-11 ENCOUNTER — Encounter: Payer: Self-pay | Admitting: Emergency Medicine

## 2021-11-11 DIAGNOSIS — J069 Acute upper respiratory infection, unspecified: Secondary | ICD-10-CM | POA: Diagnosis not present

## 2021-11-11 DIAGNOSIS — N189 Chronic kidney disease, unspecified: Secondary | ICD-10-CM | POA: Insufficient documentation

## 2021-11-11 DIAGNOSIS — Z20822 Contact with and (suspected) exposure to covid-19: Secondary | ICD-10-CM | POA: Diagnosis not present

## 2021-11-11 DIAGNOSIS — B349 Viral infection, unspecified: Secondary | ICD-10-CM

## 2021-11-11 DIAGNOSIS — I129 Hypertensive chronic kidney disease with stage 1 through stage 4 chronic kidney disease, or unspecified chronic kidney disease: Secondary | ICD-10-CM | POA: Diagnosis not present

## 2021-11-11 DIAGNOSIS — R059 Cough, unspecified: Secondary | ICD-10-CM | POA: Diagnosis present

## 2021-11-11 LAB — RESP PANEL BY RT-PCR (FLU A&B, COVID) ARPGX2
Influenza A by PCR: NEGATIVE
Influenza B by PCR: NEGATIVE
SARS Coronavirus 2 by RT PCR: NEGATIVE

## 2021-11-11 LAB — SARS CORONAVIRUS 2 BY RT PCR: SARS Coronavirus 2 by RT PCR: NEGATIVE

## 2021-11-11 LAB — GROUP A STREP BY PCR: Group A Strep by PCR: NOT DETECTED

## 2021-11-11 NOTE — ED Triage Notes (Signed)
Pt here with a cough, congestion, and a sore throat x 2 days. Pt would like to be tested for covid. Pt denies fevers at home.

## 2021-11-11 NOTE — Discharge Instructions (Signed)
Your COVID, influenza, and strep tests are all negative.  Your chest x-ray shows no concern of infection.  Please take tylenol every 6-8 hours for body aches or sore throat.  Follow up with primary care if not improving over the next few days. Return to the ER for symptoms that change, worsen, or for new concerns if unable to schedule an appointment.

## 2021-11-11 NOTE — ED Provider Notes (Signed)
[facility] Emergency Department Provider Note  ____________________________________________  Time seen: Approximately 11:54 AM  I have reviewed the triage vital signs and the nursing notes.   HISTORY  Chief Complaint Cough and URI    HPI TAYVIN PRESLAR is a 71 y.o. male with history of hypertension,.  ARF, NSTEMI, pulmonary fibrosis, CKD and as listed in EMR presents to the emergency department for treatment and evaluation of decreased energy, cough, congestion, sore throat for the past 2 days.  No known fever. He has had to use his albuterol inhaler more often with worsening dyspnea on exertion since onset of symptoms. No known sick exposure. No alleviating measures prior to arrival.  Allergies Pollen extract ____________________________________________   PHYSICAL EXAM:  VITAL SIGNS: ED Triage Vitals  Enc Vitals Group     BP 11/11/21 1051 (!) 141/87     Pulse Rate 11/11/21 1051 89     Resp 11/11/21 1051 18     Temp 11/11/21 1050 98.4 F (36.9 C)     Temp Source 11/11/21 1050 Oral     SpO2 11/11/21 1051 95 %     Weight 11/11/21 1050 139 lb 8.8 oz (63.3 kg)     Height 11/11/21 1050 5\' 6"  (1.676 m)     Head Circumference --      Peak Flow --      Pain Score 11/11/21 1050 0     Pain Loc --      Pain Edu? --      Excl. in GC? --     Constitutional: Alert and oriented. Well appearing and in no acute distress. Eyes: Conjunctivae are normal.  Head: Atraumatic. Nose: No congestion/rhinnorhea. Mouth/Throat: Mucous membranes are moist.  Oropharynx mildly erythematous, tonsils not visualized. Uvula is midline. Neck: No stridor. Voice clear. Lymphatic: Anterior cervical nodes non tender. Cardiovascular: Normal rate, regular rhythm.  Respiratory: Normal respiratory effort. Lungs CTAB. Gastrointestinal: Soft and nontender. Musculoskeletal:  Neurologic:  Normal speech and language. No gross focal neurologic deficits are appreciated. Skin:  Skin is  warm, dry and intact. No rash noted ___________________________________________   LABS (all labs ordered are listed, but only abnormal results are displayed)  Labs Reviewed  SARS CORONAVIRUS 2 BY RT PCR  GROUP A STREP BY PCR  RESP PANEL BY RT-PCR (FLU A&B, COVID) [id number]   ____________________________________________  EKG  Not indiated ____________________________________________  RADIOLOGY  I personally interpreted and reviewed images and radiology report from images obtained today.  Chest x-ray negative for acute cardiopulmonary abnormality. ____________________________________________   PROCEDURES  Procedure(s) performed: None  Critical Care performed: No ____________________________________________   INITIAL IMPRESSION / ASSESSMENT AND PLAN / ED COURSE  71 year old male presents to the emergency department for treatment and evaluation of decreased energy level with cough and sore throat x 2 days. See HPI.  Covid test obtained from triage and is negative. Strep screen negative as well.  On exam, breath sounds are diminished throughout especially in left upper. Plan will be to get a chest x-ray and add influenza test. Patient aware and agreeable to the plan.  Chest x-ray negative for acute concerns.  Influenza test negative as well as COVID and strep.  Vital signs are reassuring.  Plan will be to discharge home with instruction on symptomatic treatment and ER return precautions.  If he is not feeling better over the week, he is to follow-up with his primary care provider.  If he is unable to see primary care and his symptoms change  or worsen, he is to return to the emergency department.  Pertinent labs & imaging results that were available during my care of the patient were reviewed by me and considered in my medical decision making (see chart for details). ____________________________________________  Discharge Medication List as of 11/11/2021  1:38 PM       FINAL CLINICAL IMPRESSION(S) / ED DIAGNOSES  Final diagnoses:  Acute viral syndrome    If controlled substance prescribed during this visit, 12 month history viewed on the [facility] prior to issuing an initial prescription for Schedule II or III opiod.   Note:  This document was prepared using Dragon voice recognition software and may include unintentional dictation errors.    Chinita Pester, FNP 11/11/21 1536    Concha Se, MD 11/11/21 [phone]

## 2022-07-17 ENCOUNTER — Emergency Department
Admission: EM | Admit: 2022-07-17 | Discharge: 2022-07-17 | Disposition: A | Discharge status: Home / Self Care | Payer: PPO | Attending: Emergency Medicine | Admitting: Emergency Medicine

## 2022-07-17 ENCOUNTER — Emergency Department: Payer: PPO

## 2022-07-17 DIAGNOSIS — Z8616 Personal history of COVID-19: Secondary | ICD-10-CM | POA: Insufficient documentation

## 2022-07-17 DIAGNOSIS — N183 Chronic kidney disease, stage 3 unspecified: Secondary | ICD-10-CM | POA: Insufficient documentation

## 2022-07-17 DIAGNOSIS — Z794 Long term (current) use of insulin: Secondary | ICD-10-CM | POA: Diagnosis not present

## 2022-07-17 DIAGNOSIS — K5900 Constipation, unspecified: Secondary | ICD-10-CM | POA: Insufficient documentation

## 2022-07-17 DIAGNOSIS — Z7984 Long term (current) use of oral hypoglycemic drugs: Secondary | ICD-10-CM | POA: Insufficient documentation

## 2022-07-17 DIAGNOSIS — E1122 Type 2 diabetes mellitus with diabetic chronic kidney disease: Secondary | ICD-10-CM | POA: Insufficient documentation

## 2022-07-17 DIAGNOSIS — I129 Hypertensive chronic kidney disease with stage 1 through stage 4 chronic kidney disease, or unspecified chronic kidney disease: Secondary | ICD-10-CM | POA: Diagnosis not present

## 2022-07-17 DIAGNOSIS — Z7982 Long term (current) use of aspirin: Secondary | ICD-10-CM | POA: Insufficient documentation

## 2022-07-17 MED ORDER — MINERAL OIL RE ENEM
1.0000 | ENEMA | Freq: Once | RECTAL | Status: AC
  Administered 2022-07-17: 1 via RECTAL

## 2022-07-17 NOTE — ED Triage Notes (Signed)
Pt sts that he has not been able to have a BM in two days. Pt sts that he has tried OTC meds but hey have not worked.

## 2022-07-17 NOTE — Discharge Instructions (Signed)
You have been seen today in the emergency room for constipation.  I encourage you to drink at least 64 ounces of water a day.  Please increase your male daily fiber as well.  We discussed the use of MiraLAX to obtain daily bowel movement.  If you continue to have problems follow-up at urgent care.

## 2022-07-17 NOTE — ED Provider Notes (Signed)
[facility] Emergency Department Provider Note   ____________________________________________   Event Date/Time   First MD Initiated Contact with Patient 07/17/22 1209     (approximate)  I have reviewed the triage vital signs and the nursing notes.   HISTORY  Chief Complaint Constipation    HPI Bryan Oconnor is a 72 y.o. male presents with his granddaughter to the emergency room today.  Patient reports that he has not had a bowel movement in the last 48 hours.  Patient reports that normally he has a bowel movement every morning.  Yesterday when he noticed that he had not had a bowel movement in the last 24 hours he started taking MiraLAX.  He has taken 2 doses of MiraLAX without results.  Patient decided today that he would come to the emergency room as it had been 48 hours.  Patient denies any abdominal pain, back pain, rectal pain or pressure, fever, nausea/vomiting.   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   Acute on chronic respiratory failure with hypoxia (HCC) 01/25/2019   Emphysema of lung (HCC) 01/25/2019   NSTEMI (non-ST elevated myocardial infarction) (HCC) 01/24/2019   Elevated troponin 01/24/2019   Acute hypoxemic respiratory failure (HCC) 01/03/2019   CKD stage 3 secondary to diabetes (HCC) 01/03/2019   COVID-19 virus infection 12/17/2018   Hyponatremia 12/17/2018   Essential hypertension 12/17/2018   Uncontrolled diabetes mellitus with hyperglycemia (HCC) 12/17/2018   Viral pneumonia 12/17/2018   Pneumonia due to COVID-19 virus 12/17/2018   Acute renal failure superimposed on stage 3 chronic kidney disease (HCC)  12/16/2018    No past surgical history on file.  Prior to Admission medications   Medication Sig Start Date End Date Taking? Authorizing Provider  acetaminophen (TYLENOL) 500 MG tablet Take 500 mg by mouth every 6 (six) hours as needed for mild pain.    [provider]  albuterol (VENTOLIN HFA) 108 (90 Base) MCG/ACT inhaler Inhale 2 puffs into the lungs every 4 (four) hours.    [provider]  aspirin EC 81 MG tablet Take 81 mg by mouth daily.    [provider]  atorvastatin (LIPITOR) 40 MG tablet Take 40 mg by mouth daily. 11/26/18   [provider]  benzonatate (TESSALON) 200 MG capsule Take 1 capsule (200 mg total) by mouth 3 (three) times daily. Patient not taking: Reported on 01/24/2019 12/22/18   [name], MD  famotidine (PEPCID) 20 MG tablet Take 1 tablet (20 mg total) by mouth daily for 10 days. 12/23/18 01/02/19  [name], MD  finasteride (PROSCAR) 5 MG tablet Take 5 mg by mouth daily. 11/26/18   [provider]  gabapentin (NEURONTIN) 300 MG capsule Take 600 mg by mouth at bedtime. 12/10/18   [provider]  glipiZIDE (GLUCOTROL XL) 10 MG 24 hr tablet Take 10 mg by mouth daily. 11/26/18   [provider]  insulin aspart (NOVOLOG) 100 UNIT/ML injection Can switch to any other cheaper alternative.. Before each meal 3 times a day , 140-199 - 2 units, 200-250 - 4 units, 251-299 - 6 units,  300-349 - 8 units,  350 or above 10 units. Insulin PEN if  approved, provide syringes and needles if needed. 01/07/19   [name], MD  insulin detemir (LEVEMIR) 100 UNIT/ML injection Inject 0.12 mLs (12 Units total) into the skin daily. Can switch to any long acting and same dose Patient not taking: Reported on 01/24/2019 01/07/19   [name], MD  metFORMIN (GLUCOPHAGE) 1000 MG tablet Take 1,000 mg by mouth 2 (two) times daily with a meal. 11/26/18   [provider]  midodrine (PROAMATINE) 5 MG tablet Take 1  tablet (5 mg total) by mouth 3 (three) times daily with meals. 02/07/19   [name], MD  mouth rinse LIQD solution 15 mLs by Mouth Rinse route 2 (two) times daily. 30 days supply 02/07/19   [name], MD  Multiple Vitamin (MULTIVITAMIN WITH MINERALS) TABS tablet Take 1 tablet by mouth daily. 02/08/19   [name], MD  Nutritional Supplements (FEEDING SUPPLEMENT, NEPRO CARB STEADY,) LIQD Take 237 mLs by mouth 3 (three) times daily between meals. For 30 days supply 02/07/19   [name], MD  oxymetazoline (AFRIN) 0.05 % nasal spray Place 1 spray into both nostrils 2 (two) times daily. 02/07/19   [name], MD  pantoprazole (PROTONIX) 20 MG tablet Take 1 tablet (20 mg total) by mouth daily. 02/08/19   [name], MD  predniSONE (DELTASONE) 5 MG tablet Prednisone 10mg  PO daily for 30 days 02/07/19   [name], MD  sulfamethoxazole-trimethoprim (BACTRIM) 400-80 MG tablet Take 1 tablet by mouth daily. 02/08/19   [name], MD    Allergies Pollen extract  No family history on file.  Social History Social History   Tobacco Use   Smoking status: Never   Smokeless tobacco: Never  Vaping Use   Vaping Use: Never used  Substance Use Topics   Alcohol use: Never   Drug use: Not Currently    Review of Systems  Constitutional: No fever/chills Cardiovascular: Denies chest pain. Respiratory: Denies shortness of breath. Gastrointestinal: No abdominal pain.  No nausea, no vomiting.  No diarrhea.  Positive constipation Genitourinary: Negative for dysuria. Musculoskeletal: Negative for back pain. Neurological: Negative for headaches, focal weakness or numbness.   ____________________________________________   PHYSICAL EXAM:  VITAL SIGNS: ED Triage Vitals [07/17/22 1109]  Enc Vitals Group     BP (!) 157/87     Pulse Rate 71     Resp 18     Temp 97.9 F (36.6 C)     Temp Source Oral     SpO2 95 %     Weight 170 lb (77.1 kg)     Height      Head  Circumference      Peak Flow      Pain Score 8     Pain Loc      Pain Edu?      Excl. in GC?     Constitutional: Alert and oriented. Well appearing and in no acute distress. Head: Atraumatic. Mouth/Throat: Mucous membranes are moist.   Cardiovascular: Normal rate, regular rhythm. Grossly normal heart sounds.  Good peripheral circulation. Respiratory: Normal respiratory effort.  No retractions. Lungs CTAB. Gastrointestinal: Soft and nontender. No distention. No abdominal bruits.  Patient has hyperactive bowel sounds in all 4 quadrants. Neurologic:  Normal speech and language. No gross focal neurologic deficits are appreciated. No gait instability. Skin:  Skin is warm, dry and intact. No rash noted. Psychiatric: Mood and affect are normal. Speech and behavior are normal.  ____________________________________________   LABS (all labs ordered are listed, but only  abnormal results are displayed)  Labs Reviewed - No data to display ____________________________________________  EKG   ____________________________________________  RADIOLOGY  ED MD interpretation: X-ray reviewed by me and read by radiologist.  Official radiology report(s): DG Abd 2 Views  Result Date: 07/17/2022 CLINICAL DATA:  Abdominal distention. Patient reports no bowel movement in the past 2 days. EXAM: ABDOMEN - 2 VIEW COMPARISON:  None Available. FINDINGS: The bowel gas pattern is normal. Mild fecal burden. There is no evidence of free air. No air-fluid levels. No radio-opaque calculi or other significant radiographic abnormality is seen. IMPRESSION: Nonobstructive bowel gas pattern. Mild fecal burden. Electronically Signed   By: Sherron Ales M.D.   On: 07/17/2022 12:24    ____________________________________________   PROCEDURES  Procedure(s) performed: None  Procedures  Critical Care performed: No  ____________________________________________   INITIAL IMPRESSION / ASSESSMENT AND PLAN / ED  COURSE     WLLIAM PASINI is a 72 y.o. male presents with his granddaughter to the emergency room today.  Patient reports that he has not had a bowel movement in the last 48 hours.  Patient reports that normally he has a bowel movement every morning.  Yesterday when he noticed that he had not had a bowel movement in the last 24 hours he started taking MiraLAX.  He has taken 2 doses of MiraLAX without results. Patient reports that he drinks at least 64 ounces of water a day.  Patient decided today that he would come to the emergency room as it had been 48 hours.  Patient denies any abdominal pain, back pain, rectal pain or pressure, fever, nausea/vomiting.   Will obtain x-ray of abdomen. X-ray reveals nonobstructive bowel gas pattern with mild fecal burden Will order enema with plan to discharge afterwards.  Patient had good results after enema will discharged home in stable condition at this time. Patient is encouraged to increase water intake to at least 64 ounces a day.  He is also encouraged to increase his fiber intake.  We discussed the use of MiraLAX on a daily basis until he is having a regular bowel movement daily.      ____________________________________________   FINAL CLINICAL IMPRESSION(S) / ED DIAGNOSES  Final diagnoses:  Constipation, unspecified constipation type     ED Discharge Orders     None        Note:  This document was prepared using Dragon voice recognition software and may include unintentional dictation errors.     Herschell Dimes, NP 07/17/22 1544    Chesley Noon, MD 07/18/22 2009
